# Patient Record
Sex: Female | Born: 1955 | Race: White | Hispanic: No | Smoking: Former smoker
Health system: Southern US, Community
[De-identification: ages and names within clinical notes are randomized; demographics above are authoritative.]

## PROBLEM LIST (undated history)

## (undated) DIAGNOSIS — T8859XA Other complications of anesthesia, initial encounter: Secondary | ICD-10-CM

## (undated) DIAGNOSIS — G5601 Carpal tunnel syndrome, right upper limb: Secondary | ICD-10-CM

## (undated) DIAGNOSIS — G5621 Lesion of ulnar nerve, right upper limb: Secondary | ICD-10-CM

## (undated) DIAGNOSIS — Z923 Personal history of irradiation: Secondary | ICD-10-CM

## (undated) DIAGNOSIS — E785 Hyperlipidemia, unspecified: Secondary | ICD-10-CM

## (undated) DIAGNOSIS — H269 Unspecified cataract: Secondary | ICD-10-CM

## (undated) DIAGNOSIS — K219 Gastro-esophageal reflux disease without esophagitis: Secondary | ICD-10-CM

## (undated) DIAGNOSIS — Z972 Presence of dental prosthetic device (complete) (partial): Secondary | ICD-10-CM

## (undated) DIAGNOSIS — Z853 Personal history of malignant neoplasm of breast: Secondary | ICD-10-CM

## (undated) DIAGNOSIS — I10 Essential (primary) hypertension: Secondary | ICD-10-CM

## (undated) DIAGNOSIS — T4145XA Adverse effect of unspecified anesthetic, initial encounter: Secondary | ICD-10-CM

## (undated) DIAGNOSIS — M1990 Unspecified osteoarthritis, unspecified site: Secondary | ICD-10-CM

## (undated) DIAGNOSIS — E119 Type 2 diabetes mellitus without complications: Secondary | ICD-10-CM

## (undated) HISTORY — DX: Gastro-esophageal reflux disease without esophagitis: K21.9

## (undated) HISTORY — DX: Personal history of irradiation: Z92.3

## (undated) HISTORY — DX: Unspecified cataract: H26.9

## (undated) HISTORY — PX: CATARACT EXTRACTION: SUR2

## (undated) HISTORY — DX: Other complications of anesthesia, initial encounter: T88.59XA

## (undated) HISTORY — DX: Essential (primary) hypertension: I10

## (undated) HISTORY — DX: Adverse effect of unspecified anesthetic, initial encounter: T41.45XA

## (undated) HISTORY — DX: Hyperlipidemia, unspecified: E78.5

---

## 1997-08-30 ENCOUNTER — Other Ambulatory Visit: Admission: RE | Admit: 1997-08-30 | Discharge: 1997-08-30 | Payer: Self-pay | Admitting: Obstetrics and Gynecology

## 1999-02-13 ENCOUNTER — Other Ambulatory Visit: Admission: RE | Admit: 1999-02-13 | Discharge: 1999-02-13 | Payer: Self-pay | Admitting: *Deleted

## 1999-07-24 ENCOUNTER — Other Ambulatory Visit: Admission: RE | Admit: 1999-07-24 | Discharge: 1999-07-24 | Payer: Self-pay | Admitting: Obstetrics and Gynecology

## 1999-07-24 ENCOUNTER — Encounter (INDEPENDENT_AMBULATORY_CARE_PROVIDER_SITE_OTHER): Payer: Self-pay

## 2000-12-17 ENCOUNTER — Other Ambulatory Visit: Admission: RE | Admit: 2000-12-17 | Discharge: 2000-12-17 | Payer: Self-pay | Admitting: Obstetrics and Gynecology

## 2002-01-24 ENCOUNTER — Other Ambulatory Visit: Admission: RE | Admit: 2002-01-24 | Discharge: 2002-01-24 | Payer: Self-pay | Admitting: Obstetrics and Gynecology

## 2002-08-10 ENCOUNTER — Encounter: Payer: Self-pay | Admitting: Specialist

## 2002-08-11 ENCOUNTER — Ambulatory Visit (HOSPITAL_COMMUNITY): Admission: RE | Admit: 2002-08-11 | Discharge: 2002-08-11 | Payer: Self-pay | Admitting: Specialist

## 2002-08-11 HISTORY — PX: KNEE ARTHROSCOPY: SHX127

## 2005-06-29 ENCOUNTER — Emergency Department (HOSPITAL_COMMUNITY): Admission: EM | Admit: 2005-06-29 | Discharge: 2005-06-29 | Payer: Self-pay | Admitting: Emergency Medicine

## 2005-07-10 ENCOUNTER — Ambulatory Visit: Payer: Self-pay | Admitting: Gastroenterology

## 2005-07-16 ENCOUNTER — Ambulatory Visit: Payer: Self-pay | Admitting: Gastroenterology

## 2005-07-28 ENCOUNTER — Ambulatory Visit: Payer: Self-pay | Admitting: Gastroenterology

## 2005-08-04 ENCOUNTER — Ambulatory Visit: Payer: Self-pay | Admitting: Gastroenterology

## 2005-09-16 ENCOUNTER — Ambulatory Visit (HOSPITAL_COMMUNITY): Admission: RE | Admit: 2005-09-16 | Discharge: 2005-09-16 | Payer: Self-pay

## 2005-09-16 ENCOUNTER — Encounter (INDEPENDENT_AMBULATORY_CARE_PROVIDER_SITE_OTHER): Payer: Self-pay | Admitting: *Deleted

## 2005-09-16 HISTORY — PX: CHOLECYSTECTOMY: SHX55

## 2006-09-29 ENCOUNTER — Ambulatory Visit: Payer: Self-pay | Admitting: Vascular Surgery

## 2009-04-28 DIAGNOSIS — C801 Malignant (primary) neoplasm, unspecified: Secondary | ICD-10-CM

## 2009-04-28 DIAGNOSIS — Z853 Personal history of malignant neoplasm of breast: Secondary | ICD-10-CM

## 2009-04-28 HISTORY — DX: Malignant (primary) neoplasm, unspecified: C80.1

## 2009-04-28 HISTORY — DX: Personal history of malignant neoplasm of breast: Z85.3

## 2010-04-28 DIAGNOSIS — Z853 Personal history of malignant neoplasm of breast: Secondary | ICD-10-CM

## 2010-04-28 HISTORY — DX: Personal history of malignant neoplasm of breast: Z85.3

## 2010-05-01 ENCOUNTER — Encounter
Admission: RE | Admit: 2010-05-01 | Discharge: 2010-05-01 | Payer: Self-pay | Source: Home / Self Care | Attending: Radiology | Admitting: Radiology

## 2010-05-20 LAB — COMPREHENSIVE METABOLIC PANEL
ALT: 23 U/L (ref 0–35)
AST: 21 U/L (ref 0–37)
Albumin: 3.8 g/dL (ref 3.5–5.2)
Alkaline Phosphatase: 112 U/L (ref 39–117)
BUN: 21 mg/dL (ref 6–23)
Chloride: 108 mEq/L (ref 96–112)
GFR calc Af Amer: >60 mL/min (ref >60)
Potassium: 4.4 mEq/L (ref 3.5–5.1)
Sodium: 141 mEq/L (ref 135–145)
Total Bilirubin: 0.5 mg/dL (ref 0.3–1.2)
Total Protein: 6.8 g/dL (ref 6.0–8.3)

## 2010-05-20 LAB — DIFFERENTIAL
Basophils Relative: 1 % (ref 0–1)
Eosinophils Absolute: 0.3 10*3/uL (ref 0.0–0.7)
Lymphs Abs: 3.6 10*3/uL (ref 0.7–4.0)
Monocytes Relative: 7 % (ref 3–12)
Neutro Abs: 5.4 10*3/uL (ref 1.7–7.7)
Neutrophils Relative %: 53 % (ref 43–77)

## 2010-05-20 LAB — CBC
Hemoglobin: 13.5 g/dL (ref 12.0–15.0)
MCH: 30.5 pg (ref 26.0–34.0)
Platelets: 212 10*3/uL (ref 150–400)
RBC: 4.42 MIL/uL (ref 3.87–5.11)
WBC: 10.1 10*3/uL (ref 4.0–10.5)

## 2010-05-20 LAB — URINALYSIS, ROUTINE W REFLEX MICROSCOPIC
Nitrite: NEGATIVE
Specific Gravity, Urine: 1.018 (ref 1.005–1.030)
Urine Glucose, Fasting: NEGATIVE mg/dL
pH: 5.5 (ref 5.0–8.0)

## 2010-05-21 ENCOUNTER — Ambulatory Visit (HOSPITAL_COMMUNITY)
Admission: RE | Admit: 2010-05-21 | Discharge: 2010-05-21 | Payer: Self-pay | Source: Home / Self Care | Attending: General Surgery | Admitting: General Surgery

## 2010-05-21 HISTORY — PX: PARTIAL MASTECTOMY WITH NEEDLE LOCALIZATION AND AXILLARY SENTINEL LYMPH NODE BX: SHX6009

## 2010-05-22 LAB — GLUCOSE, CAPILLARY: Glucose-Capillary: 116 mg/dL — ABNORMAL HIGH (ref 70–99)

## 2010-05-23 ENCOUNTER — Ambulatory Visit: Payer: Self-pay | Admitting: Oncology

## 2010-05-24 NOTE — Op Note (Signed)
NAMESHAMYA, [name]                ACCOUNT NO.:  [id number]  MEDICAL RECORD NO.:  [id number]          PATIENT TYPE:  AMB  LOCATION:  SDS                          FACILITY:  [facility]  PHYSICIAN:  Angelia Mould. 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  OPERATIONS PERFORMED: 1. Inject blue dye, left breast. 2. Left partial mastectomy with needle localization. 3. Left axillary sentinel lymph node mapping and biopsy. 4. Re-excision medial margin of lumpectomy cavity.  SURGEON:  Angelia Mould. Derrell Lolling, MD  OPERATIVE INDICATIONS:  This is a 55 year old Caucasian female without any history of prior breast problems.  Screening mammograms showed a small 7-mm mass superiorly in the left breast at the 12 o'clock position.  This was biopsied and showed invasive ductal carcinoma which was strongly ER/PR positive and HER2 negative with a Ki-67 of 17%. Subsequent MRI measured the post biopsy mass as a 1.1 x 1.3 cm mass and indicated this was a solitary abnormality without any other abnormalities in either breast and no adenopathy.  She was counseled as an outpatient.  She desired breast conservation.  She is brought to the operating room electively.  OPERATIVE TECHNIQUE:  The patient underwent wire localization at [facility] this morning and the wire entered the left breast from the lateral-to-medial direction and went through and just superior to the mammographic abnormality.  The radiologist marked the skin with an X overlying the tumor.  The patient was then brought to [facility].  In the holding area, the Nuclear Medicine technician injected her  with radionuclide.  The mammograms were reviewed.  The patient was taken to the operating room.  General endotracheal anesthesia was induced.  The patient was identified as correct patient, correct procedure, and correct site, and a surgical time-out was held. Following an alcohol prep, I injected 5 mL of blue dye into the left breast subareolar area.  This was 2 mL of methylene blue mixed with 3 mL of saline.  The breast was massaged for 5 minutes.  We then prepped and draped the left breast and axilla and chest wall. Intravenous antibiotics had been given 0.5% Marcaine with epinephrine was used as a local infiltration anesthetic.  I mapped out a transverse curvilinear incision in the superior aspect of the left breast, placing the radiologist'a skin mark at the center.  The incision was made and dissection was carried down into the breast tissue following the wire and the skin marker. Immediately, I felt that I came close to the tumor as I just saw the tip of the wire.  I removed the lumpectomy specimen and marked it with the 6- color margin marker kit.  Specimen mammogram showed that we had removed the mammographic abnormality and the marker clip.  This was then sent to pathology.  Because of my concern that I might have been close  on the medial margin, I then re-excised about a 5 x 5 x 1 cm area of breast tissue at the medial margin starting just below the skin edge and going almost all the way back to the pectoralis fascia.  The specimen was then removed and marked with the color kit to mark the new medial margin.  This was sent for routine histology.  This wound was irrigated with saline. Hemostasis was excellent and achieved with electrocautery.  The breast tissues were closed with interrupted sutures of 3-0 Vicryl and I closed the breast tissues in 2 separate layers and I then closed the skin with a running subcuticular suture of 4-0 Monocryl and Dermabond.  I then used the  NeoProbe to identify the focal increased radioactivity in the left axilla.  A transverse incision was made at the hairline and the skin crease.  Dissection was carried down through the subcutaneous tissue.  I then incised the clavipectoral fascia.  Using the NeoProbe, I mapped out 2 sentinel lymph nodes.  One was very hot and very blue and one was very hot but was not blue.  These were sent as sentinel lymph nodes #1 and #2.  There was some additional axillary tissue that came out as part of the dissection.  This was sent labeled additional nonsentinel axillary tissue.  The left axilla was irrigated with saline. Hemostasis was excellent and achieved with electrocautery.  The deeper axillary tissues were closed with interrupted sutures of 3-0 Vicryl and skin closed with running subcuticular suture of 4-0 Monocryl and Dermabond.  The patient tolerated the procedure well and was taken to the recovery room in stable condition.  Estimated blood loss was about 20 mL.  Complications none.  Sponge, needle, and instrument counts were correct.     Angelia Mould. Derrell Lolling, M.D.     HMI/MEDQ  D:  05/21/2010  T:  05/22/2010  Job:  [id number]  cc:   Windle Guard, M.D. Barb Merino, M.D.  Electronically Signed by Claud Kelp M.D. on 05/22/2010 10:04:52 AM

## 2010-06-03 ENCOUNTER — Encounter (HOSPITAL_BASED_OUTPATIENT_CLINIC_OR_DEPARTMENT_OTHER): Payer: 59 | Admitting: Oncology

## 2010-06-03 ENCOUNTER — Encounter: Payer: Self-pay | Admitting: Gastroenterology

## 2010-06-03 ENCOUNTER — Ambulatory Visit: Payer: 59 | Attending: Radiation Oncology | Admitting: Radiation Oncology

## 2010-06-03 DIAGNOSIS — L2989 Other pruritus: Secondary | ICD-10-CM | POA: Insufficient documentation

## 2010-06-03 DIAGNOSIS — R5381 Other malaise: Secondary | ICD-10-CM | POA: Insufficient documentation

## 2010-06-03 DIAGNOSIS — Z510 Encounter for antineoplastic radiation therapy: Secondary | ICD-10-CM | POA: Insufficient documentation

## 2010-06-03 DIAGNOSIS — C50919 Malignant neoplasm of unspecified site of unspecified female breast: Secondary | ICD-10-CM

## 2010-06-03 DIAGNOSIS — L538 Other specified erythematous conditions: Secondary | ICD-10-CM | POA: Insufficient documentation

## 2010-06-03 DIAGNOSIS — L298 Other pruritus: Secondary | ICD-10-CM | POA: Insufficient documentation

## 2010-06-03 DIAGNOSIS — C50419 Malignant neoplasm of upper-outer quadrant of unspecified female breast: Secondary | ICD-10-CM | POA: Insufficient documentation

## 2010-07-09 NOTE — Letter (Signed)
Summary: Ruthann Cancer MD  Ruthann Cancer MD   Imported By: Lester [place] 07/04/2010 09:09:13  _____________________________________________________________________  External Attachment:    Type:   Image     Comment:   External Document

## 2010-08-20 ENCOUNTER — Other Ambulatory Visit: Payer: Self-pay | Admitting: Oncology

## 2010-08-20 ENCOUNTER — Encounter: Payer: 59 | Admitting: Oncology

## 2010-08-20 LAB — RESEARCH LABS

## 2010-09-09 ENCOUNTER — Encounter (HOSPITAL_COMMUNITY): Payer: 59

## 2010-09-09 ENCOUNTER — Other Ambulatory Visit: Payer: Self-pay | Admitting: Obstetrics & Gynecology

## 2010-09-09 LAB — SURGICAL PCR SCREEN
MRSA, PCR: NEGATIVE
Staphylococcus aureus: NEGATIVE

## 2010-09-09 LAB — BASIC METABOLIC PANEL
CO2: 25 mEq/L (ref 19–32)
Calcium: 9.8 mg/dL (ref 8.4–10.5)
Chloride: 101 mEq/L (ref 96–112)
GFR calc Af Amer: >60 mL/min (ref >60)
Sodium: 136 mEq/L (ref 135–145)

## 2010-09-09 LAB — CBC
Hemoglobin: 13.7 g/dL (ref 12.0–15.0)
Platelets: 214 10*3/uL (ref 150–400)
RBC: 4.36 MIL/uL (ref 3.87–5.11)

## 2010-09-10 NOTE — Consult Note (Signed)
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  She works at [employer] and is on her feet during  the day and has what she describes as what she describes as throbbing  and aching fatigue in both legs as the day progresses.  She has not worn  elastic compression stockings but does state that elevation of the legs  is helpful.  She has swelling in her ankles and feet as the day  progresses.  She has had no history of bleeding, ulcerations,  thrombophlebitis, or deep venous thrombosis.  She has tried Aleve with  some success; but, this was discontinued at Dr. Hennie Duos recommendation.   PAST MEDICAL HISTORY:  Positive for:  1. Borderline diabetes mellitus.  2. Negative for hypertension, hyperlipidemia, coronary artery disease,      chronic obstructive pulmonary disease, or stroke.   PAST SURGICAL HISTORY:  Previous surgery includes:  1. Left knee replacement.  2. Cholecystectomy.   FAMILY HISTORY:  Positive for coronary artery disease and diabetes in  her mother and father.  Negative for stroke.   SOCIAL HISTORY:  She is married and does work as a Pensions consultant at the  [employer]; and, as noted, is on her feet most of the day.  She does not use tobacco or alcohol.   REVIEW OF SYSTEMS:  Please see Health History Form.   MEDICATIONS:  Please see Health History Form.   ALLERGIES:  None known.   PHYSICAL EXAMINATION:  VITAL SIGNS:  Blood pressure is 145/86, heart  rate is 87, respirations 18.  GENERAL:  In general, she is a healthy-appearing middle-aged female in  no apparent distress.  She is alert and oriented x3.  NECK:  Her neck is supple.  There are 3+ carotid pulses palpable.  No  bruits are audible.   There is no palpable adenopathy in the neck.  NEUROLOGIC EXAM:  Normal.  EXTREMITIES:  Upper extremity pulses are 3+ bilaterally at the brachial  and radial levels.  CHEST:  Clear to auscultation.  CARDIOVASCULAR EXAM:  There is a regular rhythm.  No murmurs.  ABDOMEN:  Soft, nontender with no palpable masses.  PULSES:  She has 3+ femoral popliteal and 2+ dorsalis pedis pulses  palpable bilaterally.   ASSESSMENT:  She has diffuse spider veins and reticular veins in both  lower extremities particularly in the distal anterior thigh laterally  and medially, as well as into the calf regions laterally.  There is a  prominent varix present over her right knee lateral to the patella  extending over about a 6 inch distance.  She has no hyperpigmentation  ulceration.  There is evidence of spaces ulcers in the past or evidence  of chronic venous insufficiency distally.   I do not think she has evidence of severe underlying valvular  incompetence or reflux but does have significant reticular and spider  veins bilaterally and 1 prominent varix on the left leg all of which  could be treated well with sclerotherapy.   RECOMMENDATIONS/PLAN:  I discussed this with her and recommended it and  also we fit her for some elastic compression stockings which I think  will improve her symptomatology.  She will decide whether she would like  to proceed with the sclerotherapy and notify us.   Quita Skye Hart [place], M.D.  Electronically Signed  JDL/MEDQ  D:  09/29/2006  T:  09/29/2006  Job:  [id number]   cc:   Windle Guard, M.D.

## 2010-09-13 ENCOUNTER — Other Ambulatory Visit: Payer: Self-pay | Admitting: Oncology

## 2010-09-13 ENCOUNTER — Encounter (HOSPITAL_BASED_OUTPATIENT_CLINIC_OR_DEPARTMENT_OTHER): Payer: 59 | Admitting: Oncology

## 2010-09-13 DIAGNOSIS — Z170 Estrogen receptor positive status [ER+]: Secondary | ICD-10-CM

## 2010-09-13 DIAGNOSIS — C50419 Malignant neoplasm of upper-outer quadrant of unspecified female breast: Secondary | ICD-10-CM

## 2010-09-13 DIAGNOSIS — F411 Generalized anxiety disorder: Secondary | ICD-10-CM

## 2010-09-13 DIAGNOSIS — C50919 Malignant neoplasm of unspecified site of unspecified female breast: Secondary | ICD-10-CM

## 2010-09-13 LAB — CBC WITH DIFFERENTIAL/PLATELET
BASO%: 0.6 % (ref 0.0–2.0)
EOS%: 3 % (ref 0.0–7.0)
HCT: 39.5 % (ref 34.8–46.6)
HGB: 13.1 g/dL (ref 11.6–15.9)
MCHC: 33.2 g/dL (ref 31.5–36.0)
MONO#: 0.5 10*3/uL (ref 0.1–0.9)
NEUT%: 70.8 % (ref 38.4–76.8)
RDW: 13.8 % (ref 11.2–14.5)
WBC: 8.2 10*3/uL (ref 3.9–10.3)
lymph#: 1.7 10*3/uL (ref 0.9–3.3)

## 2010-09-13 LAB — COMPREHENSIVE METABOLIC PANEL
ALT: 25 U/L (ref 0–35)
AST: 21 U/L (ref 0–37)
Albumin: 4.2 g/dL (ref 3.5–5.2)
CO2: 24 mEq/L (ref 19–32)
Calcium: 9.6 mg/dL (ref 8.4–10.5)
Chloride: 104 mEq/L (ref 96–112)
Creatinine, Ser: 1.04 mg/dL (ref 0.40–1.20)
Potassium: 4 mEq/L (ref 3.5–5.3)
Total Protein: 6.7 g/dL (ref 6.0–8.3)

## 2010-09-13 NOTE — Op Note (Signed)
NAMEAHLAYA, [name]                            ACCOUNT NO.:  [id number]   MEDICAL RECORD NO.:  [id number]                   PATIENT TYPE:  AMB   LOCATION:  DAY                                  FACILITY:  [facility]   PHYSICIAN:  Ronnell Guadalajara, M.D.                DATE OF BIRTH:  12/25/1955   DATE OF PROCEDURE:  08/11/2002  DATE OF DISCHARGE:                                 OPERATIVE REPORT   PREOPERATIVE DIAGNOSES:  Degenerative arthritis left knee with suspected  medial meniscal tear.   POSTOPERATIVE DIAGNOSES:  Degenerative arthritis medial aspect left knee  with enlarged plica.   OPERATION:  1. Diagnostic laparoscopy.  2. Plica resection.   DESCRIPTION OF PROCEDURE:  After suitable general anesthesia, the leg was  exsanguinated with an Esmarch and an Ace bandage combination and an upper  thigh tourniquet is inflated to 400 mmHg. The leg is then placed in an  arthroscopy leg holder and prepped and draped routinely. Arthroscopy is  carried out with a lateral parapatellar inflow portal and an anterolateral  portal for the correction of the scope and an anteromedial portal made under  direct visualization. Up in the suprapatellar pouch, the patellofemoral  joint looks quite good with a great deal of arthritis. There is a fairly  large medial plica that shows some abutment and wear across the surface of  the medial femoral condyle that may have accounted for some of the catching  that he has felt going on in the knee. Into the joint, there is significant  arthritic change over the medial femoral condyle although not down to bare  bone. There is significant wear of the major component of her problem  occupying a good third of the articular surface. The medial meniscus is  inspected and probed and there is no significant tearing or looseness of the  meniscus. The ACL is inspected and probed and feels good. The lateral joint  is quite sustained with normal articular cartilage and  menisci. At this  point, the 4.5 rotary meniscotome was brought in and the medial plica was  removed hoping that this was contributing a part of the discomfort in the  knee rather than blaming it all on the medial femoral condylar arthritis.  The end of the procedure, the wounds were sutured with nylon, 20 mL plain  Marcaine and 8 mL of 2% Xylocaine with adrenaline were put into the joint  and she had a mL of Xylocaine with adrenaline put in each of the portals. A  nice compression dressing. She goes to recovery in good condition.   IMPRESSION:  Degenerative arthritis medial side but significant medial  femoral condylar wear, minimal on the tibia.  Ronnell Guadalajara, M.D.    PC/MEDQ  D:  08/11/2002  T:  08/11/2002  Job:  [id number]

## 2010-09-13 NOTE — Op Note (Signed)
NAMEMAXINE, FREDMAN                ACCOUNT NO.:  [id number]   MEDICAL RECORD NO.:  [id number]          PATIENT TYPE:  AMB   LOCATION:  DAY                          FACILITY:  [facility]   PHYSICIAN:  Lebron Conners, M.D.   DATE OF BIRTH:  06-Jan-1956   DATE OF PROCEDURE:  09/16/2005  DATE OF DISCHARGE:                                 OPERATIVE REPORT   PRE AND POSTOPERATIVE DIAGNOSIS:  Cholelithiasis and chronic cholecystitis.   OPERATION:  Laparoscopic cholecystectomy.   SURGEON:  Lebron Conners, M.D.   ASSISTANT:  Angelia Mould. Derrell Lolling, M.D.   ANESTHESIA:  General and local.   SPECIMEN:  Gallbladder.   BLOOD LOSS:  Minimal.   COMPLICATIONS:  None.  The patient PACU in good condition.   PROCEDURE:  After the patient was monitored and anesthetized and had routine  preparation and draping of the abdomen, I infiltrated local anesthetic below  the umbilicus and made a transverse 3 cm incision and incised the fascia 2  cm in the midline then bluntly entered the peritoneal cavity.  There were no  evident adhesions when I put in my finger.  I put in a 0 Vicryl pursestring  suture and secured a Hassan cannula and inflated the abdomen with carbon  dioxide.  Laparoscopy disclosed no abnormalities except for a thick-walled  chronically inflamed gallbladder with some stringy adhesions to the omentum  and anterior abdominal wall around it.  I then anesthetized three additional  port sites and placed under direct view a 10 mm epigastric port and two 5 mm  right mid abdominal ports.  Grasping the gallbladder at the fundus and  elevated it toward the right shoulder and positioned the patient head-up  foot down and tilted to the left.  The adhesions were easily cleared and I  then noted that the gallbladder was full of large gallstones but was able to  grasp the infundibulum and pull it laterally.  I dissected the  hepatoduodenal ligament creating sizable window between the liver and  gallbladder  and cystic duct.  I noted the emergence of the cystic duct from  the infundibulum of the gallbladder and I noted the cystic artery traversing  the triangle of Calot.  I clipped the cystic artery with three clips and cut  between the two which were closest to the gallbladder and then cut the  cystic duct after clipping it with four clips and cutting between the two  closest to the gallbladder.  Further dissection, I encountered one  additional small artery and I clipped and ligated that.  I detached the  gallbladder from the liver using cautery and gained hemostasis with cautery.  There was some leakage of bile from the gallbladder at sites of the  graspers.  After detaching the gallbladder.  I placed it in a plastic pouch  and reserved it.  I then irrigated the right upper quadrant and removed the  irrigant and assured good hemostasis.  The clips seemed to be secure.  I  removed the gallbladder through the umbilical incision and tied the  pursestring suture.  Because I had  to enlarge the incision slightly, placed  one other 2-0 Vicryl stitch.  I removed the remaining irrigant from the  right upper quadrant and then  removed the two lateral ports under direct view and saw that there was no  bleeding from the abdominal wall.  I removed the epigastric port after  allowing the carbon dioxide to escape.  I closed all skin incisions with  intracuticular 4-0 Vicryl and Steri-Strips.      Lebron Conners, M.D.  Electronically Signed     WB/MEDQ  D:  09/16/2005  T:  09/16/2005  Job:  [id number]   cc:   Vania Rea. Jarold Motto, M.D. [facility]  [address]  [place]  [place] [address]   Windle Guard, M.D.  Fax: [phone]

## 2010-09-17 ENCOUNTER — Other Ambulatory Visit: Payer: Self-pay | Admitting: Obstetrics & Gynecology

## 2010-09-17 ENCOUNTER — Ambulatory Visit (HOSPITAL_COMMUNITY)
Admission: RE | Admit: 2010-09-17 | Discharge: 2010-09-18 | Disposition: A | Discharge status: Home / Self Care | Payer: 59 | Source: Ambulatory Visit | Attending: Obstetrics & Gynecology | Admitting: Obstetrics & Gynecology

## 2010-09-17 DIAGNOSIS — C50919 Malignant neoplasm of unspecified site of unspecified female breast: Secondary | ICD-10-CM | POA: Insufficient documentation

## 2010-09-17 DIAGNOSIS — Z170 Estrogen receptor positive status [ER+]: Secondary | ICD-10-CM | POA: Insufficient documentation

## 2010-09-17 DIAGNOSIS — N840 Polyp of corpus uteri: Secondary | ICD-10-CM | POA: Insufficient documentation

## 2010-09-17 DIAGNOSIS — D251 Intramural leiomyoma of uterus: Secondary | ICD-10-CM | POA: Insufficient documentation

## 2010-09-17 DIAGNOSIS — N920 Excessive and frequent menstruation with regular cycle: Secondary | ICD-10-CM | POA: Insufficient documentation

## 2010-09-17 DIAGNOSIS — Z01812 Encounter for preprocedural laboratory examination: Secondary | ICD-10-CM | POA: Insufficient documentation

## 2010-09-17 DIAGNOSIS — Z01818 Encounter for other preprocedural examination: Secondary | ICD-10-CM | POA: Insufficient documentation

## 2010-09-17 DIAGNOSIS — D252 Subserosal leiomyoma of uterus: Secondary | ICD-10-CM | POA: Insufficient documentation

## 2010-09-17 DIAGNOSIS — N80209 Endometriosis of unspecified fallopian tube, unspecified depth: Secondary | ICD-10-CM | POA: Insufficient documentation

## 2010-09-17 DIAGNOSIS — N802 Endometriosis of fallopian tube: Secondary | ICD-10-CM | POA: Insufficient documentation

## 2010-09-17 HISTORY — PX: ROBOTIC ASSISTED LAPAROSCOPIC LYSIS OF ADHESION: SHX6080

## 2010-09-17 HISTORY — PX: ROBOTIC ASSISTED TOTAL HYSTERECTOMY WITH BILATERAL SALPINGO OOPHERECTOMY: SHX6086

## 2010-09-17 LAB — PREGNANCY, URINE: Preg Test, Ur: NEGATIVE

## 2010-09-17 LAB — TYPE AND SCREEN

## 2010-09-18 LAB — CBC
HCT: 38.4 % (ref 36.0–46.0)
Hemoglobin: 12.2 g/dL (ref 12.0–15.0)
MCH: 31 pg (ref 26.0–34.0)
MCHC: 31.8 g/dL (ref 30.0–36.0)
MCV: 97.5 fL (ref 78.0–100.0)
RBC: 3.94 MIL/uL (ref 3.87–5.11)

## 2010-09-25 NOTE — Op Note (Signed)
[name], [name]                ACCOUNT NO.:  [id number]  MEDICAL RECORD NO.:  [id number]           PATIENT TYPE:  O  LOCATION:  9311                          FACILITY:  [facility]  PHYSICIAN:  Genia Del, M.D.DATE OF BIRTH:  05/30/1955  DATE OF PROCEDURE:  09/17/2010 DATE OF DISCHARGE:                              OPERATIVE REPORT   The patient came [facility] on Sep 17, 2010.  PREOPERATIVE DIAGNOSES:  Uterine myomas, endometrial polyps associated with menometrorrhagia, and history of breast cancer with estrogen receptor positive.  POSTOPERATIVE DIAGNOSES:  Uterine myomas, endometrial polyps associated with menometrorrhagia, and history of breast cancer with estrogen receptor positive, plus bowel adhesions in the abdomen and pelvis.  PROCEDURE:  A total laparoscopy hysterectomy and bilateral salpingo- oophorectomy with lysis of adhesions assisted with Engineer, building services robot.  SURGEON:  Genia Del, MD.  ASSISTANT:  Arlan Organ, MD.  DESCRIPTION OF PROCEDURE:  Under general anesthesia with endotracheal intubation, the patient is in lithotomy position.  She is prepped with Betadine on the abdominal, suprapubic, vulvar, and vaginal areas and draped as usual.  The vaginal exam reveals a nodular increased in size uterus about 9-10 cm.  No adnexal mass felt.  We inserted the weighted speculum in the vagina.  The anterior lip of the cervix was grasped with a tenaculum.  The hysterometery used at 8 cm.  We dilated the cervix with Hegar dilators.  We used a #8 RUMI with a medium KOH  righ, this is put in place easily.  We then put the Foley in place in the bladder.  We then go to the abdomen.  We infiltrated the subcutaneous tissue with Marcaine one-quarter plain at the Palmar point.  We make a 5-mm incision with a scalpel, inserted the Veress needle at that level.  Security test was done.  We created pneumoperitoneum with CO2.  We then insert a 5-mm trocar at  that level and a 5-mm camera.  Inspection of the abdominopelvic cavities reveal omental adhesions at the umbilicus.  The patient is status post cholecystectomy by laparoscopy.  We are clear for all the ports, we will put the umbilical incision at the superior aspect just above the the adhesions.  We infiltrated the skin at all levels with Marcaine one-quarter plain.  We make 8-mm incisions for robotic ports and a 10-mm incision for the camera port.  We used an M configuration.  We insert all ports under direct vision.  We then dock the robot on the right.  We put the instruments in, the EndoShear scissors, the PK, and Cobra clamp.  We go to the console.  We visualized the intra-abdominal and pelvic cavities.  We note adhesions that are widespread between the omentum, bowels, and the pelvic cavity as well as the abdominal walls.  The adhesions with the omentum at the umbilicus were easily removed.  We then removed adhesions between the bowels and the bladder.  We continued and removed adhesions at the back of the uterus and towards the left side and we released the bowels from the left lateral abdominal side.  We were then  able to see both adnexa and start the hysterectomy.  The ureters were difficult to see, but were in normal anatomic position bilaterally.  We cauterized and sectioned the left round ligament.  We then cauterized and sectioned the left infundibulopelvic ligament and continued under the left ovary to reach the lateral wall of the uterus.  We proceeded exactly the same way on the right side.  We then opened the visceral peritoneum anteriorly and descended the bladder past the KOH ring.  We finally cauterized and sectioned the left uterine artery and then the right uterine artery.  We were then able to open the vaginal vault anteriorly with the tip of the EndoShear scissors.  We went towards the sites, then opened the back and finished on either side from the back.  The  uterus, ovaries, tubes, and cervix were removed in one specimen through the vagina and sent to pathology.  We then put the occluder in the vagina.  We verified hemostasis and it was adequate.  We therefore switch instruments.  We used the cutting needle driver in the right hand.  We started with a long tip clamp in the left hand, but switched to the regular needle driver because it was more efficient.  We used the PK in the third arm. All the sutures total of five were brought in through the supraumbilical camera port and the camera was then put back in place.  Those sutures were parked in the right abdominal wall as we were using them.  We closed the left angle of the vaginal vault first, then the middle and then the right angle and we added two stitches on either side of the middle.  The vaginal vault was well closed.  We verified hemostasis by lowering the pressure and it was adequate.  We therefore removed all robotic instruments and went by laparoscopy.  We used a 5-mm camera as all the five sutures were removed from the abdomen through the supraumbilical port.  We then irrigated and suctioned the abdominopelvic cavities.  Hemostasis was adequate at all levels.  We removed all instruments, removed all ports under direct vision.  We closed the supraumbilical incision with a Vicryl zero on the aponeurosis.  We closed all incisions with a subcuticular stitch of Vicryl 4-0 and added Dermabond on the skin.  The patient received Ancef 1 gram IV before induction.  The estimated blood loss was 100 mL.  No complications occurred and she was brought to recovery room in good stable status.     Genia Del, M.D.     ML/MEDQ  D:  09/17/2010  T:  09/18/2010  Job:  [id number]  Electronically Signed by Genia Del M.D. on 09/25/2010 05:04:35 PM

## 2010-09-30 ENCOUNTER — Ambulatory Visit
Admission: RE | Admit: 2010-09-30 | Discharge: 2010-09-30 | Disposition: A | Discharge status: Home / Self Care | Payer: 59 | Source: Ambulatory Visit | Attending: Radiation Oncology | Admitting: Radiation Oncology

## 2010-12-05 ENCOUNTER — Other Ambulatory Visit: Payer: Self-pay | Admitting: Oncology

## 2010-12-05 ENCOUNTER — Encounter (HOSPITAL_BASED_OUTPATIENT_CLINIC_OR_DEPARTMENT_OTHER): Payer: 59 | Admitting: Oncology

## 2010-12-05 DIAGNOSIS — C50419 Malignant neoplasm of upper-outer quadrant of unspecified female breast: Secondary | ICD-10-CM

## 2010-12-05 DIAGNOSIS — C50919 Malignant neoplasm of unspecified site of unspecified female breast: Secondary | ICD-10-CM

## 2010-12-05 LAB — CBC WITH DIFFERENTIAL/PLATELET
BASO%: 0.8 % (ref 0.0–2.0)
HCT: 38.5 % (ref 34.8–46.6)
LYMPH%: 24.4 % (ref 14.0–49.7)
MCHC: 31.9 g/dL (ref 31.5–36.0)
MCV: 97 fL (ref 79.5–101.0)
MONO#: 0.5 10*3/uL (ref 0.1–0.9)
MONO%: 6.8 % (ref 0.0–14.0)
NEUT%: 62.9 % (ref 38.4–76.8)
Platelets: 208 10*3/uL (ref 145–400)
RBC: 3.97 10*6/uL (ref 3.70–5.45)
WBC: 7.8 10*3/uL (ref 3.9–10.3)

## 2010-12-05 LAB — COMPREHENSIVE METABOLIC PANEL
ALT: 29 U/L (ref 0–35)
Alkaline Phosphatase: 99 U/L (ref 39–117)
CO2: 24 mEq/L (ref 19–32)
Creatinine, Ser: 1.06 mg/dL (ref 0.50–1.10)
Glucose, Bld: 116 mg/dL — ABNORMAL HIGH (ref 70–99)
Sodium: 141 mEq/L (ref 135–145)
Total Bilirubin: 0.6 mg/dL (ref 0.3–1.2)

## 2010-12-05 LAB — CANCER ANTIGEN 27.29: CA 27.29: 29 U/mL (ref 0–39)

## 2010-12-05 LAB — VITAMIN D 25 HYDROXY (VIT D DEFICIENCY, FRACTURES): Vit D, 25-Hydroxy: 45 ng/mL (ref 30–89)

## 2010-12-06 ENCOUNTER — Encounter (INDEPENDENT_AMBULATORY_CARE_PROVIDER_SITE_OTHER): Payer: Self-pay | Admitting: General Surgery

## 2010-12-12 ENCOUNTER — Encounter (HOSPITAL_BASED_OUTPATIENT_CLINIC_OR_DEPARTMENT_OTHER): Payer: 59 | Admitting: Oncology

## 2010-12-12 DIAGNOSIS — Z170 Estrogen receptor positive status [ER+]: Secondary | ICD-10-CM

## 2010-12-12 DIAGNOSIS — C50919 Malignant neoplasm of unspecified site of unspecified female breast: Secondary | ICD-10-CM

## 2010-12-12 DIAGNOSIS — E1149 Type 2 diabetes mellitus with other diabetic neurological complication: Secondary | ICD-10-CM

## 2010-12-12 DIAGNOSIS — E1142 Type 2 diabetes mellitus with diabetic polyneuropathy: Secondary | ICD-10-CM

## 2010-12-16 ENCOUNTER — Ambulatory Visit (INDEPENDENT_AMBULATORY_CARE_PROVIDER_SITE_OTHER): Payer: 59 | Admitting: General Surgery

## 2011-01-20 ENCOUNTER — Encounter (INDEPENDENT_AMBULATORY_CARE_PROVIDER_SITE_OTHER): Payer: Self-pay | Admitting: General Surgery

## 2011-01-20 ENCOUNTER — Ambulatory Visit (INDEPENDENT_AMBULATORY_CARE_PROVIDER_SITE_OTHER): Payer: 59 | Admitting: General Surgery

## 2011-01-20 VITALS — BP 132/72 | HR 76 | Temp 97.3°F | Resp 16 | Ht 62.0 in | Wt 234.2 lb

## 2011-01-20 DIAGNOSIS — Z853 Personal history of malignant neoplasm of breast: Secondary | ICD-10-CM

## 2011-01-20 NOTE — Patient Instructions (Signed)
Your breast exam today is normal. I do not feel any evidence of cancer. I will see you in January after you get your annual mammograms.

## 2011-01-20 NOTE — Progress Notes (Signed)
Subjective:     Patient ID: [name], female   DOB: [birth year], 55 y.o.   MRN: [id number]  HPI This 55 year old Caucasian female underwent left partial mastectomy sentinel lymph biopsy and reexcision of medial margins on May 21, 2010. Pathologically she has T1c., N0, receptor positive, HER-2-negative, Ki-67 17% tumor. She underwent adjuvant radiation therapy under the guidance of Dr. Roselind Messier and is on letrozole and is followed by Dr. Darnelle Catalan. Windle Guard is her primary care physician.  She has no complaints about her breast. She notices a little bit of thickening under the lumpectomy scar and  a little bit of tenderness. She notices that her bones and joints hurt a little bit more since she started the antiestrogen therapy however she is aware that as a normal side effect.  Past Medical History  Diagnosis Date  . Hypertension   . Hyperlipidemia   . GERD (gastroesophageal reflux disease)     Current Outpatient Prescriptions  Medication Sig Dispense Refill  . atenolol (TENORMIN) 100 MG tablet Take 100 mg by mouth daily.        . calcium carbonate (OS-CAL) 600 MG TABS Take 600 mg by mouth 2 (two) times daily with a meal.        . fish oil-omega-3 fatty acids 1000 MG capsule Take 1 g by mouth daily.        Marland Kitchen gabapentin (NEURONTIN) 300 MG capsule       . letrozole (FEMARA) 2.5 MG tablet       . niacin 500 MG tablet Take 500 mg by mouth daily with breakfast.        . pravastatin (PRAVACHOL) 40 MG tablet Take 40 mg by mouth daily.        . ranitidine (ZANTAC) 300 MG tablet Take 300 mg by mouth at bedtime.        . meloxicam (MOBIC) 15 MG tablet Take 15 mg by mouth daily.         No Known Allergies   Review of Systems  12 system review of systems is performed and is negative except as described above.    Objective:   Physical Exam  Constitutional: She appears well-developed and well-nourished. No distress.  Eyes: Conjunctivae are normal. Left eye exhibits no discharge. No  scleral icterus.  Neck: Normal range of motion. Neck supple. No JVD present. No tracheal deviation present. No thyromegaly present.  Cardiovascular: Normal rate, regular rhythm, normal heart sounds and intact distal pulses.   No murmur heard. Pulmonary/Chest: Effort normal and breath sounds normal. No respiratory distress. She has no wheezes. She has no rales. She exhibits no tenderness.    Lymphadenopathy:    She has no cervical adenopathy.  Skin: Skin is warm and dry. No rash noted. She is not diaphoretic. No erythema. No pallor.  Psychiatric: She has a normal mood and affect. Her behavior is normal. Judgment and thought content normal.       Assessment:     Invasive ductal carcinoma left breast, pathologic stage TIc., N0, receptor positive, HER-2-negative, Ki-67 17%.  No evidence of recurrence 9 months following left partial mastectomy and sentinel node biopsy.  Hypertension.  Gastroesophageal reflux disease.    Plan:     Continue letrozole.  Bilateral mammograms due in December 2012.  See me in January 2013.

## 2011-02-18 ENCOUNTER — Ambulatory Visit
Admission: RE | Admit: 2011-02-18 | Discharge: 2011-02-18 | Disposition: A | Discharge status: Home / Self Care | Payer: 59 | Source: Ambulatory Visit | Attending: Radiation Oncology | Admitting: Radiation Oncology

## 2011-02-20 ENCOUNTER — Encounter: Payer: Self-pay | Admitting: *Deleted

## 2011-02-20 DIAGNOSIS — C50919 Malignant neoplasm of unspecified site of unspecified female breast: Secondary | ICD-10-CM

## 2011-02-21 ENCOUNTER — Telehealth: Payer: Self-pay | Admitting: Oncology

## 2011-02-21 NOTE — Telephone Encounter (Signed)
Lvm advising appts for lab 06/03/11 @ 8.30am and md visit 06/10/11 @ 9.30am. I will also mail pt an appt calendar.

## 2011-05-15 ENCOUNTER — Encounter: Payer: Self-pay | Admitting: Oncology

## 2011-06-02 ENCOUNTER — Other Ambulatory Visit (HOSPITAL_BASED_OUTPATIENT_CLINIC_OR_DEPARTMENT_OTHER): Payer: 59 | Admitting: Lab

## 2011-06-02 DIAGNOSIS — C50919 Malignant neoplasm of unspecified site of unspecified female breast: Secondary | ICD-10-CM

## 2011-06-02 DIAGNOSIS — F411 Generalized anxiety disorder: Secondary | ICD-10-CM

## 2011-06-02 DIAGNOSIS — Z170 Estrogen receptor positive status [ER+]: Secondary | ICD-10-CM

## 2011-06-02 LAB — CBC WITH DIFFERENTIAL/PLATELET
BASO%: 0.4 % (ref 0.0–2.0)
LYMPH%: 33.7 % (ref 14.0–49.7)
MCHC: 34.2 g/dL (ref 31.5–36.0)
MONO#: 0.7 10*3/uL (ref 0.1–0.9)
NEUT#: 4.4 10*3/uL (ref 1.5–6.5)
RBC: 3.83 10*6/uL (ref 3.70–5.45)
RDW: 12.7 % (ref 11.2–14.5)
WBC: 8.1 10*3/uL (ref 3.9–10.3)
lymph#: 2.7 10*3/uL (ref 0.9–3.3)

## 2011-06-02 LAB — COMPREHENSIVE METABOLIC PANEL
ALT: 21 U/L (ref 0–35)
AST: 19 U/L (ref 0–37)
Calcium: 9.7 mg/dL (ref 8.4–10.5)
Chloride: 106 mEq/L (ref 96–112)
Creatinine, Ser: 0.9 mg/dL (ref 0.50–1.10)
Potassium: 4.4 mEq/L (ref 3.5–5.3)
Sodium: 141 mEq/L (ref 135–145)
Total Protein: 6.6 g/dL (ref 6.0–8.3)

## 2011-06-02 LAB — CANCER ANTIGEN 27.29: CA 27.29: 24 U/mL (ref 0–39)

## 2011-06-03 ENCOUNTER — Other Ambulatory Visit: Payer: 59 | Admitting: Lab

## 2011-06-05 ENCOUNTER — Ambulatory Visit (INDEPENDENT_AMBULATORY_CARE_PROVIDER_SITE_OTHER): Payer: 59 | Admitting: General Surgery

## 2011-06-10 ENCOUNTER — Telehealth: Payer: Self-pay | Admitting: *Deleted

## 2011-06-10 ENCOUNTER — Ambulatory Visit (HOSPITAL_BASED_OUTPATIENT_CLINIC_OR_DEPARTMENT_OTHER): Payer: 59 | Admitting: Oncology

## 2011-06-10 VITALS — BP 141/84 | HR 90 | Temp 98.0°F | Ht 62.0 in | Wt 222.0 lb

## 2011-06-10 DIAGNOSIS — C50919 Malignant neoplasm of unspecified site of unspecified female breast: Secondary | ICD-10-CM

## 2011-06-10 NOTE — Telephone Encounter (Signed)
gave patient appointments for august with amy berry on 11-2011 and called [facility] and cancelled appointment for feb patient placed on the re-call list to be scheduled in May

## 2011-06-10 NOTE — Progress Notes (Signed)
[name]  DOB: May 17, 1955  MR#: [id number]  CSN#: [id number]    History of present illness:   [name] is a 56 year old [place] woman referred by Dr. Derrell Lolling for evaluation and treatment of newly diagnosed breast cancer.  [name] felt fine when she underwent routine mammographic screening June 12, 2009 at [facility].  Dr. Tilda Burrow noted a potential abnormality on the left breast so on the April 17, 2010 the patient was recalled for diagnostic left mammography and ultrasonography.  He found a persistent 7 mm mass with irregular margins, which by ultrasound measured 7 mm and was irregular and hypoechoic.  A 4 mm benign appearing cyst was incidentally noted anterior to this lesion.  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  With this information the patient was referred to Dr. Derrell Lolling and bilateral breast MRIs were obtained May 01, 2009.  This showed a 1.3 cm enhancing mass in the upper central portion of the left breast associated with the marker clip from the prior biopsy.  There were no other areas of concern in either breast and there were no suspicious internal mammary or axillary lymph nodes.  Accordingly on May 21, 2010 the patient underwent left lumpectomy and axillary lymph node sampling.  The results of this procedure ([id number]) confirmed an invasive ductal carcinoma, grade 2 with clear and ample margins and no evidence of lymphovascular invasion.  Both sentinel lymph nodes were clear.  HER-2/neu was repeated and was again negative.  Interval history: Xzaria returns today for followup of her breast cancer. The interval history is significant for her continuing to work full-time, with very of varying  schedules. For example she worked for shift yesterday and she will be third shift tonight. This makes it difficult for her to take her gabapentin appropriately. When she doesn't take it she has more problems with hot flashes and more insomnia. Otherwise, when she has a more stable schedule, the drug is working well for her.   Review of systems:  Aside from hot flashes she is not having any side effects that she is aware of from the letrozole. She does have some left shoulder arthritis, the pain there being on the lateral aspect, over the deltoid, rather than the medial aspect, which would be postoperative. She occasionally has stinging or stabbing pains in the left breast. She describes herself as moderately fatigued but she is working 50-70 hour weeks currently. She tells me her sugars are well controlled on metformin. She has left knee problems and she wears a brace for that. Unfortunately i she has to use steel-toed boots at work and that makes the knee problem worse.. Otherwise a detailed review of systems was noncontributory  Past medical history:      Past Medical History  Diagnosis Date  . Hypertension   . Hyperlipidemia   . GERD (gastroesophageal reflux disease)   . Cancer   60 P/Y tobacco abuse, discontinued JAN 2012 Diabetes mellitus Remote migraines osteoarthritis  Past surgical history:      Past Surgical History  Procedure Date  . Cholecystectomy   . Knee surgery   . Breast surgery     left lumpectomy  . Total abdominal hysterectomy w/ bilateral salpingoophorectomy 09/17/2110    total laparoscopic hysterectomy with  bilateral oophorectomy under the care of Dr. Seymour Bars  . Abdominal hysterectomy     Family history:     Family History  Problem Relation Age of Onset  . Heart disease Mother   . Heart disease Father   . Diabetes Father   The patient's father died at the age of 26 from heart problems.  The patient's mother died at the age of 38 with heart problems.  There is  one aunt and one uncle on each side with history of lung cancer.  There is no history of breast or ovarian cancer in the family to her knowledge.  Gynecologic history: She is GX, P0, menarche age 15.  Last menstrual period about 10 years ago.  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  Her husband of 40 years [name] is a "househusband."  They have about four acres and keep chickens, goats, and other animals.  The patient is not a church attender.     ADVANCED DIRECTIVES:  Health maintenance:       History  Substance Use Topics  . Smoking status: Current Everyday Smoker  . Smokeless tobacco: Not on file  . Alcohol Use:       Colonoscopy:  PAP:  Bone density: repeat due 2013  Cholesterol:    Allergies:    No Known Allergies  Medications:      Current Outpatient Prescriptions  Medication Sig Dispense Refill  . atenolol (TENORMIN) 100 MG tablet Take 100 mg by mouth daily.        . calcium carbonate (OS-CAL) 600 MG TABS Take 600 mg by mouth 2 (two) times daily with a meal.        . fish oil-omega-3 fatty acids 1000 MG capsule Take 1 g by mouth daily.        Marland Kitchen gabapentin (NEURONTIN) 300 MG capsule       . letrozole (FEMARA) 2.5 MG tablet       . meloxicam (MOBIC) 15 MG tablet Take 15 mg by mouth daily.        . niacin 500 MG tablet Take 500 mg by mouth daily with breakfast.        . pravastatin (PRAVACHOL) 40 MG tablet Take 40 mg by mouth daily.        . ranitidine (ZANTAC) 300 MG tablet Take 300 mg by mouth at bedtime.        Marland Kitchen buPROPion (WELLBUTRIN SR) 150 MG 12 hr tablet       . metFORMIN (GLUCOPHAGE) 1000 MG tablet         Physical exam:      Filed Vitals:   06/10/11 0854  BP: 141/84  Pulse: 90  Temp: 98 F (36.7 C)     Body mass index is 40.60 kg/(m^2).  ECOG PS: 0  Sclerae unicteric Oropharynx clear No peripheral  adenopathy Lungs no rales or rhonchi Heart regular rate and rhythm Abd benign MSK no focal spinal tenderness, no peripheral edema; fair range of motion left upper extremity, with some discomfort on extreme abduction Neuro: nonfocal Breasts: Right breast no suspicious findings. Left breast status post lumpectomy. No evidence of local recurrence   Lab results:    CBC    Component Value Date/Time   WBC 8.1 06/02/2011 0818   WBC 16.9* 09/18/2010 0640   RBC 3.83 06/02/2011 0818  RBC 3.94 09/18/2010 0640   HGB 12.5 06/02/2011 0818   HGB 12.2 09/18/2010 0640   HCT 36.6 06/02/2011 0818   HCT 38.4 09/18/2010 0640   PLT 207 06/02/2011 0818   PLT 216 09/18/2010 0640   MCV 95.5 06/02/2011 0818   MCV 97.5 09/18/2010 0640   MCH 32.7 06/02/2011 0818   MCH 31.0 09/18/2010 0640   MCHC 34.2 06/02/2011 0818   MCHC 31.8 09/18/2010 0640   RDW 12.7 06/02/2011 0818   RDW 13.8 09/18/2010 0640   LYMPHSABS 2.7 06/02/2011 0818   LYMPHSABS 3.6 05/17/2010 1139   MONOABS 0.7 06/02/2011 0818   MONOABS 0.7 05/17/2010 1139   EOSABS 0.3 06/02/2011 0818   EOSABS 0.3 05/17/2010 1139   BASOSABS 0.0 06/02/2011 0818   BASOSABS 0.1 05/17/2010 1139    Studies:      Mammogram at [facility] 24-May-2011 reportedly clear. We are requesting a copy of the report. Repeat bone density will be due May 23, 2012 Assessment: A 57 year old [place] woman   (1) status post left lumpectomy and sentinel lymph node sampling in January 2012 for a T1C N0, Stage IA  invasive ductal carcinoma, grade 2, strongly estrogen and progesterone receptor positive, HER2/neu negative, with a borderline/low MIB-1.   (2) Oncotype DX score in the low range, predicting an 8% risk of recurrence within 10 years if she took tamoxifen for 5 years.   (3) Status post radiation, completed April 2012.   (4) On letrozole 2.5 mg daily since May 2012 with good tolerance.    (5) Status post hysterectomy/bilateral salpingo-oophorectomy in May 2012   Plan: She is doing well from a  breast cancer point of view, with no evidence of recurrence. She will see Dr. Derrell Lolling again in may, see [place] again in August, and we will continue to "tag team her" on a every 3 month basis until a year from now, when we will start seeing her on an every 6 month basis. The overall plan is to continue letrozole for a total of 5 years. She knows to call for any problems that may develop before the next visit   Cashlynn Yearwood C 06/10/2011

## 2011-06-16 ENCOUNTER — Encounter (INDEPENDENT_AMBULATORY_CARE_PROVIDER_SITE_OTHER): Payer: Self-pay

## 2011-06-26 ENCOUNTER — Ambulatory Visit (INDEPENDENT_AMBULATORY_CARE_PROVIDER_SITE_OTHER): Payer: 59 | Admitting: General Surgery

## 2011-06-28 ENCOUNTER — Other Ambulatory Visit: Payer: Self-pay | Admitting: Physician Assistant

## 2011-06-28 DIAGNOSIS — C50919 Malignant neoplasm of unspecified site of unspecified female breast: Secondary | ICD-10-CM

## 2011-08-10 ENCOUNTER — Encounter: Payer: Self-pay | Admitting: Radiation Oncology

## 2011-08-11 ENCOUNTER — Ambulatory Visit
Admission: RE | Admit: 2011-08-11 | Discharge: 2011-08-11 | Disposition: A | Discharge status: Home / Self Care | Payer: 59 | Source: Ambulatory Visit | Attending: Radiation Oncology | Admitting: Radiation Oncology

## 2011-08-11 ENCOUNTER — Encounter: Payer: Self-pay | Admitting: *Deleted

## 2011-08-11 ENCOUNTER — Encounter: Payer: Self-pay | Admitting: Radiation Oncology

## 2011-08-11 VITALS — BP 115/74 | HR 72 | Temp 98.1°F | Wt 215.1 lb

## 2011-08-11 DIAGNOSIS — C50919 Malignant neoplasm of unspecified site of unspecified female breast: Secondary | ICD-10-CM

## 2011-08-11 NOTE — Progress Notes (Signed)
Here for routine follow up for left breast cancer.Continues to have hot flashes from femara but neurontin has helped with decreasing the effects. States mammogram from [facility] in December 2012 negative.Will call for report. Patient has had about a 15 lb intentional weight loss. States she is feeling great.

## 2011-08-11 NOTE — Progress Notes (Signed)
08/11/11- [facility] [id number] - 12 months post RT appt- The pt was into the cancer center this am for her 12 months post RT assessments.  She completed her questionnaires and her breast photographs were taken per the protocol.  The photographs were uploaded and sent to [facility].   The pt was seen and examined today by Dr. Roselind Messier.  Dr. Roselind Messier completed the pt's RTOG SOMA Evaluation.  The pt states that she is continuing to take her letrozole daily.  She does report some hot flashes which is relieved by her neurontin medication.  The pt denies using any skin care product on her affected breast.  The pt has not had a recurrence of her cancer.  She was thanked for her participation in the study.  She said that she would be willing to participate in further studies.  The pt will have follow up with regards to her breast cancer with Dr. Derrell Lolling and Dr. Darnelle Catalan.  Dr. Roselind Messier said that she does not need to see him again unless she has questions/concerns.

## 2011-08-11 NOTE — Progress Notes (Signed)
CC:   Lowella Dell, M.D. Angelia Mould. Derrell Lolling, M.D. Windle Guard, M.D.  DIAGNOSIS:  Left breast cancer.  INTERVAL SINCE RADIATION THERAPY:  1 year.  NARRATIVE:  [name] comes today for routine followup.  She completed breast conserving therapy back in April 2012.  Clinically, the patient seems to be doing well except for some hot flashes related to her letrozole.  The patient is on Neurontin which has helped the side effects associated with this medication.  The patient did undergo mammography in December 2012 showing no evidence of recurrence.  The patient denies any nipple discharge or bleeding.  She denies any significant pain in the left breast area.  PHYSICAL EXAMINATION:  Vital Signs:  The patient's temperature is 98 degrees, blood pressure 115/74, pulse 72.  Weight is 215 pounds.  The patient has incidentally been trying to lose weight.  Examination of the neck and supraclavicular region reveals no evidence of adenopathy.  The axillary areas are free of adenopathy.  Examination of the lungs reveals them to be clear.  The heart has a regular rhythm and rate.  Examination of the right breast reveals no mass or nipple discharge.  Examination of the left breast reveals some continued mild edema in the nipple-areolar complex area.  There are some mild hyperpigmentation changes noted. There is no dominant mass appreciated in the breast, nipple discharge or bleeding.  IMPRESSION AND PLAN:  Clinically no evidence of disease.  In light of the patient's close followup with Dr. Darnelle Catalan, I have not scheduled the patient for formal followup appointment but would be glad to see her at any time.    ______________________________ [name], Ph.D., M.D. JDK/MEDQ  D:  08/11/2011  T:  08/11/2011  Job:  [id number]

## 2011-09-10 ENCOUNTER — Ambulatory Visit (INDEPENDENT_AMBULATORY_CARE_PROVIDER_SITE_OTHER): Payer: 59 | Admitting: General Surgery

## 2011-09-10 ENCOUNTER — Encounter (INDEPENDENT_AMBULATORY_CARE_PROVIDER_SITE_OTHER): Payer: Self-pay | Admitting: General Surgery

## 2011-09-10 VITALS — BP 148/66 | HR 68 | Temp 96.8°F | Resp 16 | Ht 62.0 in | Wt 208.2 lb

## 2011-09-10 DIAGNOSIS — C50919 Malignant neoplasm of unspecified site of unspecified female breast: Secondary | ICD-10-CM

## 2011-09-10 NOTE — Progress Notes (Signed)
Patient ID: [name], female   DOB: 1956/03/07, 56 y.o.   MRN: [id number]  Chief Complaint  Patient presents with  . Breast Cancer Long Term Follow Up    HPI [name] is a 56 y.o. female.  She returns for long-term followup regarding the cancer of her left breast.  On May 21, 2010 she underwent left partial mastectomy, sentinel node biopsy and reexcision of medial margin. Pathologically this was a stage T1c., N0 tumor. Receptor positive. HER-2 negative. Ki-67 17%. She had adjuvant radiation therapy with Dr. Roselind Messier. She takes adjuvant letrozoleand and is followsedby Dr. Darnelle Catalan.  [facility] were performed on April 14, 2011 and they looked fine, category 2, no focal abnormality.  She last saw Dr. Darnelle Catalan in February and he plans to see her every August from here on out. HPI  Past Medical History  Diagnosis Date  . Hypertension   . Hyperlipidemia   . GERD (gastroesophageal reflux disease)   . Cancer 04/2010    Left Breast  . S/P radiation therapy 07/04/10 - 08/11/10    5040 cGy/28 Treatments to Left Breast  . Hot flashes     On Letrozole  . Osteoarthritis     Left Shoulder / Left Knee  . Hx of migraines     Past Surgical History  Procedure Date  . Cholecystectomy   . Knee surgery   . Breast lumpectomy 05/21/10    Left Lumpectomy and Axillary Node Sampling - Upper Central Portion; Invasive  Ductal Carcinoma, Grade 2. Negative  Nodes  . Total abdominal hysterectomy w/ bilateral salpingoophorectomy 09/17/2010    total laparoscopic hysterectomy with bilateral oophorectomy under the care of Dr. Seymour Bars  . Abdominal hysterectomy 08/2010    Family History  Problem Relation Age of Onset  . Heart disease Mother   . Heart disease Father   . Diabetes Father     Social History History  Substance Use Topics  . Smoking status: Former Smoker    Quit date: 05/07/2010  . Smokeless tobacco: Never Used  . Alcohol Use: No    No Known  Allergies  Current Outpatient Prescriptions  Medication Sig Dispense Refill  . losartan (COZAAR) 100 MG tablet Daily.      . ONE TOUCH ULTRA TEST test strip Daily.      Marland Kitchen atenolol (TENORMIN) 100 MG tablet Take 100 mg by mouth daily.        Marland Kitchen buPROPion (WELLBUTRIN SR) 150 MG 12 hr tablet       . calcium carbonate (OS-CAL) 600 MG TABS Take 600 mg by mouth 2 (two) times daily with a meal.        . cyanocobalamin 1000 MCG tablet Take 100 mcg by mouth daily.      . fish oil-omega-3 fatty acids 1000 MG capsule Take 1 g by mouth daily.        Marland Kitchen gabapentin (NEURONTIN) 300 MG capsule TAKE ONE CAPSULE AT BEDTIME  30 capsule  5  . letrozole (FEMARA) 2.5 MG tablet       . meloxicam (MOBIC) 15 MG tablet Take 15 mg by mouth daily.        . metFORMIN (GLUCOPHAGE) 1000 MG tablet       . niacin 500 MG tablet Take 500 mg by mouth daily with breakfast.        . pravastatin (PRAVACHOL) 40 MG tablet Take 40 mg by mouth daily.        . ranitidine (ZANTAC) 300 MG  tablet Take 300 mg by mouth at bedtime.          Review of Systems Review of Systems  Constitutional: Negative for fever, chills and unexpected weight change.  HENT: Negative for hearing loss, congestion, sore throat, trouble swallowing and voice change.   Eyes: Negative for visual disturbance.  Respiratory: Negative for cough and wheezing.   Cardiovascular: Negative for chest pain, palpitations and leg swelling.  Gastrointestinal: Negative for nausea, vomiting, abdominal pain, diarrhea, constipation, blood in stool, abdominal distention and anal bleeding.  Genitourinary: Negative for hematuria, vaginal bleeding and difficulty urinating.  Musculoskeletal: Negative for arthralgias.  Skin: Negative for rash and wound.  Neurological: Negative for seizures, syncope and headaches.  Hematological: Negative for adenopathy. Does not bruise/bleed easily.  Psychiatric/Behavioral: Negative for confusion.    Blood pressure 148/66, pulse 68, temperature  96.8 F (36 C), temperature source Temporal, resp. rate 16, height 5\' 2"  (1.575 m), weight 208 lb 4 oz (94.462 kg).  Physical Exam Physical Exam  Constitutional: She is oriented to person, place, and time. She appears well-developed and well-nourished. No distress.  HENT:  Head: Normocephalic and atraumatic.  Eyes: Conjunctivae and EOM are normal. Pupils are equal, round, and reactive to light. Left eye exhibits no discharge. No scleral icterus.  Neck: Neck supple. No JVD present. No tracheal deviation present. No thyromegaly present.  Cardiovascular: Normal rate, regular rhythm, normal heart sounds and intact distal pulses.   No murmur heard. Pulmonary/Chest: Effort normal and breath sounds normal. No respiratory distress. She has no wheezes. She has no rales. She exhibits no tenderness.       Transverse scar superior left breast and left axillary scar are well healed. There are no other skin changes bilaterally. There is no palpable mass bilaterally. The nipple and areolar complexes looked normal. No axillary adenopathy on either side.  Musculoskeletal: She exhibits no edema and no tenderness.  Lymphadenopathy:    She has no cervical adenopathy.  Neurological: She is alert and oriented to person, place, and time. She exhibits normal muscle tone. Coordination normal.  Skin: Skin is warm. No rash noted. She is not diaphoretic. No erythema. No pallor.  Psychiatric: She has a normal mood and affect. Her behavior is normal. Judgment and thought content normal.    Data Reviewed I reviewed Dr. Darrall Dears notes, my notes, and the recent mammograms.  Assessment    Invasive ductal carcinoma left breast, pathologic stage T1c., N0, receptor positive, HER-2 negative, Ki67 17%.  No evidence of recurrence 16 months postop left partial mastectomy, SLN biopsy, and reexcision of medial margin, adjuvant radiation therapy and adjuvant Letrozole.    Plan    She will see Dr. Darnelle Catalan in August. She  will continue the letrozole  Return to see me in May of 2014  Mammograms are scheduled for December 2013 and annually thereafter.       Mavin Dyke M 09/10/2011, 9:38 AM

## 2011-09-10 NOTE — Patient Instructions (Signed)
Your breast exam today is normal bilaterally. Your mammograms that were performed in December are also normal with no focal abnormality. There is no evidence of breast cancer on either side.  Continue the letrozole and continue to keep your appointments with Dr. Darnelle Catalan.  Return to see Dr. Derrell Lolling in one year.  Be sure to get your mammograms in December of this year.

## 2011-10-11 ENCOUNTER — Other Ambulatory Visit: Payer: Self-pay | Admitting: Oncology

## 2011-11-06 ENCOUNTER — Other Ambulatory Visit: Payer: Self-pay | Admitting: Physician Assistant

## 2011-11-06 DIAGNOSIS — C50919 Malignant neoplasm of unspecified site of unspecified female breast: Secondary | ICD-10-CM

## 2011-12-08 ENCOUNTER — Telehealth: Payer: Self-pay | Admitting: *Deleted

## 2011-12-08 ENCOUNTER — Ambulatory Visit (HOSPITAL_BASED_OUTPATIENT_CLINIC_OR_DEPARTMENT_OTHER): Payer: 59 | Admitting: Physician Assistant

## 2011-12-08 ENCOUNTER — Other Ambulatory Visit (HOSPITAL_BASED_OUTPATIENT_CLINIC_OR_DEPARTMENT_OTHER): Payer: 59 | Admitting: Lab

## 2011-12-08 ENCOUNTER — Encounter: Payer: Self-pay | Admitting: Physician Assistant

## 2011-12-08 VITALS — BP 143/79 | HR 66 | Temp 97.1°F | Resp 20 | Ht 62.0 in | Wt 194.9 lb

## 2011-12-08 DIAGNOSIS — Z853 Personal history of malignant neoplasm of breast: Secondary | ICD-10-CM

## 2011-12-08 DIAGNOSIS — C50919 Malignant neoplasm of unspecified site of unspecified female breast: Secondary | ICD-10-CM

## 2011-12-08 DIAGNOSIS — C50119 Malignant neoplasm of central portion of unspecified female breast: Secondary | ICD-10-CM

## 2011-12-08 DIAGNOSIS — Z170 Estrogen receptor positive status [ER+]: Secondary | ICD-10-CM

## 2011-12-08 LAB — COMPREHENSIVE METABOLIC PANEL
ALT: 15 U/L (ref 0–35)
Albumin: 4.3 g/dL (ref 3.5–5.2)
Alkaline Phosphatase: 113 U/L (ref 39–117)
Glucose, Bld: 91 mg/dL (ref 70–99)
Potassium: 4.4 mEq/L (ref 3.5–5.3)
Sodium: 140 mEq/L (ref 135–145)
Total Bilirubin: 0.7 mg/dL (ref 0.3–1.2)
Total Protein: 6.8 g/dL (ref 6.0–8.3)

## 2011-12-08 LAB — CBC WITH DIFFERENTIAL/PLATELET
BASO%: 1.3 % (ref 0.0–2.0)
Eosinophils Absolute: 0.4 10*3/uL (ref 0.0–0.5)
MCHC: 33.6 g/dL (ref 31.5–36.0)
MCV: 95.2 fL (ref 79.5–101.0)
MONO#: 0.6 10*3/uL (ref 0.1–0.9)
MONO%: 8.3 % (ref 0.0–14.0)
NEUT#: 4 10*3/uL (ref 1.5–6.5)
RBC: 3.72 10*6/uL (ref 3.70–5.45)
RDW: 13.4 % (ref 11.2–14.5)
WBC: 6.7 10*3/uL (ref 3.9–10.3)

## 2011-12-08 NOTE — Telephone Encounter (Signed)
Gave patient appointment for mammogram and bone density on 04-14-2012 starting at 9:00am gave patient appointment for lab being one week before the md appointment

## 2011-12-08 NOTE — Progress Notes (Signed)
[name]  DOB: [birth year]  MR#: [id number]  CSN#: [id number]    History of present illness:   [name] is a 56 year old [place] woman referred by Dr. Derrell Lolling for evaluation and treatment of newly diagnosed breast cancer.  [name] felt fine when she underwent routine mammographic screening June 12, 2009 at [facility].  Dr. Tilda Burrow noted a potential abnormality on the left breast so on the April 17, 2010 the patient was recalled for diagnostic left mammography and ultrasonography.  He found a persistent 7 mm mass with irregular margins, which by ultrasound measured 7 mm and was irregular and hypoechoic.  A 4 mm benign appearing cyst was incidentally noted anterior to this lesion.  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  With this information the patient was referred to Dr. Derrell Lolling and bilateral breast MRIs were obtained May 01, 2009.  This showed a 1.3 cm enhancing mass in the upper central portion of the left breast associated with the marker clip from the prior biopsy.  There were no other areas of concern in either breast and there were no suspicious internal mammary or axillary lymph nodes.  Accordingly on May 21, 2010 the patient underwent left lumpectomy and axillary lymph node sampling.  The results of this procedure ([id number]) confirmed an invasive ductal carcinoma, grade 2 with clear and ample margins and no evidence of lymphovascular invasion.  Both sentinel lymph nodes were clear.  HER-2/neu was repeated and was again negative.  Interval history: [name] returns today for followup of her breast cancer. The interval history is significant for her continuing to work full-time.  Her schedule alternates  between first shift and third shift, making it difficult for [name] to maintain a regular routine. Her husband would love for her to retire.  [name] continues on her letrozole which overall she tolerates well. She continues to have some hot flashes which are treated with gabapentin. She has noticed some increased pain in her lower tremor these, primarily her hips and knees, but tells me she has "good days and bad days". She takes Aleve for the pain, and at this time feels it is not affecting her quality of life and not to go off of the letrozole.    Review of systems:    Otherwise, [name] has had no recent illnesses and denies fevers or chills. No rashes or skin changes. No abnormal bleeding. Her diabetes is well controlled she tells me, and she continues on metformin. She's eating and drinking well with no nausea or change in bowel habits. No cough or increased shortness of breath. No chest pain or palpitations. No abnormal headaches or dizziness. Other than the hip and knee pain as noted above, no additional myalgias or arthralgias elsewhere.  A detailed review of systems is otherwise noncontributory.   Past medical history:      Past Medical History  Diagnosis Date  . Hypertension   . Hyperlipidemia   . GERD (gastroesophageal reflux disease)   . Cancer 04/2010    Left Breast  . S/P radiation therapy 07/04/10 - 08/11/10    5040 cGy/28 Treatments to Left Breast  . Hot flashes     On Letrozole  . Osteoarthritis     Left Shoulder / Left Knee  .  Hx of migraines   60 P/Y tobacco abuse, discontinued JAN 2012 Diabetes mellitus Remote migraines osteoarthritis  Past surgical history:      Past Surgical History  Procedure Date  . Cholecystectomy   . Knee surgery   . Breast lumpectomy 05/21/10    Left Lumpectomy and Axillary Node Sampling - Upper Central Portion; Invasive  Ductal Carcinoma, Grade 2. Negative  Nodes  . Total abdominal hysterectomy w/ bilateral salpingoophorectomy  09/17/2010    total laparoscopic hysterectomy with bilateral oophorectomy under the care of Dr. Seymour Bars  . Abdominal hysterectomy 08/2010    Family history:     Family History  Problem Relation Age of Onset  . Heart disease Mother   . Heart disease Father   . Diabetes Father   The patient's father died at the age of 78 from heart problems.  The patient's mother died at the age of 25 with heart problems.  There is one aunt and one uncle on each side with history of lung cancer.  There is no history of breast or ovarian cancer in the family to her knowledge.  Gynecologic history: She is GX, P0, menarche age 56.  Last menstrual period about 10 years ago.  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  Her husband of 65 years [name] is a "househusband."  They have about four acres and keep chickens, goats, and other animals.  The patient is not a church attender.     ADVANCED DIRECTIVES:  Health maintenance:       History  Substance Use Topics  . Smoking status: Former Smoker    Quit date: 05/07/2010  . Smokeless tobacco: Never Used  . Alcohol Use: No      Colonoscopy:  Due in 2014  PAP: Due now  Bone density: repeat due 2013  Cholesterol: Followed by PCP    Allergies:    No Known Allergies  Medications:      Current Outpatient Prescriptions  Medication Sig Dispense Refill  . atenolol (TENORMIN) 100 MG tablet Take 100 mg by mouth daily.        Marland Kitchen buPROPion (WELLBUTRIN SR) 150 MG 12 hr tablet       . calcium carbonate (OS-CAL) 600 MG TABS Take 600 mg by mouth 2 (two) times daily with a meal.        . cyanocobalamin 1000 MCG tablet Take 100 mcg by mouth daily.      . fish oil-omega-3 fatty acids 1000 MG capsule Take 1 g by mouth daily.        Marland Kitchen gabapentin (NEURONTIN) 300 MG capsule TAKE ONE CAPSULE AT BEDTIME  30 capsule  1  .  glucosamine-chondroitin 500-400 MG tablet Take 1 tablet by mouth 3 (three) times daily.      Marland Kitchen letrozole (FEMARA) 2.5 MG tablet TAKE ONE (1) TABLET EACH DAY  90 tablet  4  . losartan (COZAAR) 100 MG tablet Daily.      . metFORMIN (GLUCOPHAGE) 1000 MG tablet Take by mouth 2 (two) times daily with a meal. 1000mg  po QAM and 500 mg PO QHS      . naproxen sodium (ANAPROX) 220 MG tablet Take 220 mg by mouth 2 (two) times daily with a meal.      . niacin 500 MG tablet Take 500 mg by mouth daily with breakfast.        .  ONE TOUCH ULTRA TEST test strip Daily.      . pravastatin (PRAVACHOL) 40 MG tablet Take 40 mg by mouth daily.        . ranitidine (ZANTAC) 300 MG tablet Take 300 mg by mouth at bedtime.        . meloxicam (MOBIC) 15 MG tablet Take 15 mg by mouth daily.          Physical exam:     Middle-aged white female who appears comfortable and in no acute distress. Filed Vitals:   12/08/11 0846  BP: 143/79  Pulse: 66  Temp: 97.1 F (36.2 C)  Resp: 20     Body mass index is 35.65 kg/(m^2).  ECOG PS: 0  Filed Weights   12/08/11 0846  Weight: 194 lb 14.4 oz (88.406 kg)   Physical Exam: HEENT:  Sclerae anicteric.  Oropharynx clear.   Nodes:  No cervical, supraclavicular, or axillary lymphadenopathy palpated.  Breast Exam:  Right breast is benign no masses, skin changes, or nipple inversion. Left breast is status post lumpectomy with well-healed incision. No suspicious nodularities or evidence of local recurrence. Lungs:  Clear to auscultation bilaterally.  No crackles, rhonchi, or wheezes.   Heart:  Regular rate and rhythm.   Abdomen:  Soft, nontender.  Positive bowel sounds.  No organomegaly or masses palpated.   Musculoskeletal:  No focal spinal tenderness to palpation.  Extremities:  Benign.  No peripheral edema or cyanosis.   Skin:  Benign.   Neuro:  Nonfocal. Alert and oriented x3.     Lab results:    CBC    Component Value Date/Time   WBC 6.7 12/08/2011 0806   WBC 16.9*  09/18/2010 0640   RBC 3.72 12/08/2011 0806   RBC 3.94 09/18/2010 0640   HGB 11.9 12/08/2011 0806   HGB 12.2 09/18/2010 0640   HCT 35.4 12/08/2011 0806   HCT 38.4 09/18/2010 0640   PLT 186 12/08/2011 0806   PLT 216 09/18/2010 0640   MCV 95.2 12/08/2011 0806   MCV 97.5 09/18/2010 0640   MCH 32.0 12/08/2011 0806   MCH 31.0 09/18/2010 0640   MCHC 33.6 12/08/2011 0806   MCHC 31.8 09/18/2010 0640   RDW 13.4 12/08/2011 0806   RDW 13.8 09/18/2010 0640   LYMPHSABS 1.7 12/08/2011 0806   LYMPHSABS 3.6 05/17/2010 1139   MONOABS 0.6 12/08/2011 0806   MONOABS 0.7 05/17/2010 1139   EOSABS 0.4 12/08/2011 0806   EOSABS 0.3 05/17/2010 1139   BASOSABS 0.1 12/08/2011 0806   BASOSABS 0.1 05/17/2010 1139    Studies:      Mammogram at [facility] 05/21/2011 reportedly clear. Repeat bone density will be due May 20, 2012 Assessment: A 56 year old [place] woman   (1) status post left lumpectomy and sentinel lymph node sampling in January 2012 for a T1C N0, Stage IA  invasive ductal carcinoma, grade 2, strongly estrogen and progesterone receptor positive, HER2/neu negative, with a borderline/low MIB-1.   (2) Oncotype DX score in the low range, predicting an 8% risk of recurrence within 10 years if she took tamoxifen for 5 years.   (3) Status post radiation, completed April 2012.   (4) On letrozole 2.5 mg daily since May 2012 with good tolerance.    (5) Status post hysterectomy/bilateral salpingo-oophorectomy in May 2012   Plan:  Miya continues to do well, and at this time there is no clinical evidence of disease recurrence. We'll continue to alternate her appointments between our office and Dr. Jacinto Halim office.  Accordingly, she will be seeing Dr. Derrell Lolling in November, then see [place] again in February. She is also being scheduled for her routine mammogram and bone density, both of which will be obtained at [facility] in December of this year.   The overall plan is to continue letrozole for a total of 5 years. She knows to call  for any problems that may develop before the next visit   Chariti Havel 12/08/2011

## 2011-12-12 ENCOUNTER — Telehealth: Payer: Self-pay | Admitting: *Deleted

## 2011-12-12 NOTE — Telephone Encounter (Signed)
Patient was placed on the recall list for central [facility] surgery

## 2011-12-14 ENCOUNTER — Other Ambulatory Visit: Payer: Self-pay | Admitting: Oncology

## 2012-02-19 ENCOUNTER — Encounter (INDEPENDENT_AMBULATORY_CARE_PROVIDER_SITE_OTHER): Payer: Self-pay | Admitting: General Surgery

## 2012-02-19 ENCOUNTER — Ambulatory Visit (INDEPENDENT_AMBULATORY_CARE_PROVIDER_SITE_OTHER): Payer: 59 | Admitting: General Surgery

## 2012-02-19 VITALS — BP 148/82 | HR 76 | Temp 98.0°F | Resp 14 | Ht 62.0 in | Wt 190.6 lb

## 2012-02-19 DIAGNOSIS — C50919 Malignant neoplasm of unspecified site of unspecified female breast: Secondary | ICD-10-CM

## 2012-02-19 NOTE — Progress Notes (Signed)
Patient ID: [name], female   DOB: 01/25/1956, 56 y.o.   MRN: [id number]  No chief complaint on file.   HPI [name] is a 56 y.o. female.  She returns for long-term followup of her left breast cancer.  On 05/21/2010 this patient underwent left partial mastectomy, sentinel lymph node biopsy and reexcision of medial margin. She had a hormone receptor positive, HER-2-negative, Ki 67 17% tumor. Pathologic stage TI C., N0. She had adjuvant radiation therapy. She has done well. She is on letrozole and sees Dr. Darnelle Catalan at least every 6 months. She is due for mammograms in December  She has no complaints about her breast. She has no new medical problems. She is followed by [facility]. HPI  Past Medical History  Diagnosis Date  . Hypertension   . Hyperlipidemia   . GERD (gastroesophageal reflux disease)   . Cancer 04/2010    Left Breast  . S/P radiation therapy 07/04/10 - 08/11/10    5040 cGy/28 Treatments to Left Breast  . Hot flashes     On Letrozole  . Osteoarthritis     Left Shoulder / Left Knee  . Hx of migraines     Past Surgical History  Procedure Date  . Cholecystectomy   . Knee surgery   . Breast lumpectomy 05/21/10    Left Lumpectomy and Axillary Node Sampling - Upper Central Portion; Invasive  Ductal Carcinoma, Grade 2. Negative  Nodes  . Total abdominal hysterectomy w/ bilateral salpingoophorectomy 09/17/2010    total laparoscopic hysterectomy with bilateral oophorectomy under the care of Dr. Seymour Bars  . Abdominal hysterectomy 08/2010    Family History  Problem Relation Age of Onset  . Heart disease Mother   . Heart disease Father   . Diabetes Father     Social History History  Substance Use Topics  . Smoking status: Former Smoker    Quit date: 05/07/2010  . Smokeless tobacco: Never Used  . Alcohol Use: No    No Known Allergies  Current Outpatient Prescriptions  Medication Sig Dispense Refill  . atenolol (TENORMIN) 100 MG tablet Take 100 mg by  mouth daily.        Marland Kitchen buPROPion (WELLBUTRIN SR) 150 MG 12 hr tablet       . calcium carbonate (OS-CAL) 600 MG TABS Take 600 mg by mouth 2 (two) times daily with a meal.        . cyanocobalamin 1000 MCG tablet Take 100 mcg by mouth daily.      . fish oil-omega-3 fatty acids 1000 MG capsule Take 1 g by mouth daily.        Marland Kitchen gabapentin (NEURONTIN) 300 MG capsule TAKE ONE CAPSULE BY MOUTH AT BEDTIME  30 capsule  6  . glucosamine-chondroitin 500-400 MG tablet Take 1 tablet by mouth 3 (three) times daily.      Marland Kitchen letrozole (FEMARA) 2.5 MG tablet TAKE ONE (1) TABLET EACH DAY  90 tablet  4  . losartan (COZAAR) 100 MG tablet Daily.      . meloxicam (MOBIC) 15 MG tablet Take 15 mg by mouth daily.        . metFORMIN (GLUCOPHAGE) 1000 MG tablet Take by mouth 2 (two) times daily with a meal. 1000mg  po QAM and 500 mg PO QHS      . naproxen sodium (ANAPROX) 220 MG tablet Take 220 mg by mouth 2 (two) times daily with a meal.      . niacin 500 MG tablet Take 500  mg by mouth daily with breakfast.        . ONE TOUCH ULTRA TEST test strip Daily.      . pravastatin (PRAVACHOL) 40 MG tablet Take 40 mg by mouth daily.        . ranitidine (ZANTAC) 300 MG tablet Take 300 mg by mouth at bedtime.          Review of Systems Review of Systems  Constitutional: Negative for fever, chills and unexpected weight change.  HENT: Negative for hearing loss, congestion, sore throat, trouble swallowing and voice change.   Eyes: Negative for visual disturbance.  Respiratory: Negative for cough and wheezing.   Cardiovascular: Negative for chest pain, palpitations and leg swelling.  Gastrointestinal: Negative for nausea, vomiting, abdominal pain, diarrhea, constipation, blood in stool, abdominal distention and anal bleeding.  Genitourinary: Negative for hematuria, vaginal bleeding and difficulty urinating.  Musculoskeletal: Negative for arthralgias.  Skin: Negative for rash and wound.  Neurological: Negative for seizures, syncope  and headaches.  Hematological: Negative for adenopathy. Does not bruise/bleed easily.  Psychiatric/Behavioral: Negative for confusion.    Blood pressure 148/82, pulse 76, temperature 98 F (36.7 C), resp. rate 14, height 5\' 2"  (1.575 m), weight 190 lb 9.6 oz (86.456 kg).  Physical Exam Physical Exam  Constitutional: She is oriented to person, place, and time. She appears well-developed and well-nourished. No distress.  HENT:  Head: Normocephalic and atraumatic.  Eyes: Conjunctivae normal and EOM are normal. Pupils are equal, round, and reactive to light. Left eye exhibits no discharge. No scleral icterus.  Neck: Neck supple. No JVD present. No tracheal deviation present. No thyromegaly present.  Cardiovascular: Normal rate, regular rhythm, normal heart sounds and intact distal pulses.   No murmur heard. Pulmonary/Chest: Effort normal and breath sounds normal. No respiratory distress. She has no wheezes. She has no rales. She exhibits no tenderness.       Bilateral breast exam reveals no palpable mass, no pathologic skin changes, and no axillary adenopathy. Well-healed lumpectomy scar left breast at 12:00 and well-healed left axillary scar. No arm swelling or sensory deficit.  Musculoskeletal: She exhibits no edema and no tenderness.  Lymphadenopathy:    She has no cervical adenopathy.  Neurological: She is alert and oriented to person, place, and time. She exhibits normal muscle tone.  Skin: Skin is warm and dry. No rash noted. She is not diaphoretic. No erythema. No pallor.  Psychiatric: She has a normal mood and affect. Her behavior is normal. Judgment and thought content normal.    Data Reviewed My old records. Notes from [facility] cancer center.  Assessment    Invasive mammary carcinoma left breast, 12:00 position, pathologic stage is T1 C., N0, receptor positive, HER-2-negative.  No evidence of recurrence 21 months following left partial mastectomy, sentinel lymph biopsy and  adjuvant radiation therapy with ongoing adjuvant antiestrogen therapy.    Plan    Continue regular appointment to Dr. Darnelle Catalan every 6 months or so. Continue letrozole.  Annual mammograms in December the shear  Considering her close clinical followup with Dr. Darnelle Catalan, I will see her back in January 2015 after her December mammograms       Westyn Keatley M. Derrell Lolling, M.D., [facility] General and Minimally invasive Surgery Breast and Colorectal Surgery Office:   [phone] Pager:   [phone]  02/19/2012, 10:20 AM

## 2012-02-19 NOTE — Patient Instructions (Signed)
You are doing well. Your physical exam shows no evidence of cancer.  Be sure to get your mammograms in December 2013 and an annually in December thereafter.  Keep your regular appointment with Dr. Darnelle Catalan.  Return to see Dr. Derrell Lolling in January, 2015 after your December mammograms.

## 2012-06-01 ENCOUNTER — Ambulatory Visit (HOSPITAL_BASED_OUTPATIENT_CLINIC_OR_DEPARTMENT_OTHER): Payer: 59 | Admitting: Lab

## 2012-06-01 ENCOUNTER — Telehealth: Payer: Self-pay | Admitting: Oncology

## 2012-06-01 DIAGNOSIS — C50919 Malignant neoplasm of unspecified site of unspecified female breast: Secondary | ICD-10-CM

## 2012-06-01 LAB — CBC WITH DIFFERENTIAL/PLATELET
Basophils Absolute: 0.1 10*3/uL (ref 0.0–0.1)
EOS%: 3.7 % (ref 0.0–7.0)
HCT: 36.7 % (ref 34.8–46.6)
HGB: 12.2 g/dL (ref 11.6–15.9)
LYMPH%: 23.4 % (ref 14.0–49.7)
MCH: 31.2 pg (ref 25.1–34.0)
MCV: 93.9 fL (ref 79.5–101.0)
NEUT%: 67.4 % (ref 38.4–76.8)
Platelets: 212 10*3/uL (ref 145–400)
lymph#: 2 10*3/uL (ref 0.9–3.3)

## 2012-06-01 LAB — COMPREHENSIVE METABOLIC PANEL (CC13)
ALT: 18 U/L (ref 0–55)
Albumin: 3.8 g/dL (ref 3.5–5.0)
CO2: 25 mEq/L (ref 22–29)
Chloride: 104 mEq/L (ref 98–107)
Glucose: 84 mg/dl (ref 70–99)
Potassium: 4.4 mEq/L (ref 3.5–5.1)
Sodium: 140 mEq/L (ref 136–145)
Total Bilirubin: 0.49 mg/dL (ref 0.20–1.20)
Total Protein: 7.1 g/dL (ref 6.4–8.3)

## 2012-06-02 ENCOUNTER — Other Ambulatory Visit: Payer: 59 | Admitting: Lab

## 2012-06-10 ENCOUNTER — Ambulatory Visit: Payer: 59 | Admitting: Oncology

## 2012-07-22 ENCOUNTER — Other Ambulatory Visit: Payer: Self-pay | Admitting: *Deleted

## 2012-07-22 DIAGNOSIS — C50919 Malignant neoplasm of unspecified site of unspecified female breast: Secondary | ICD-10-CM

## 2012-07-22 MED ORDER — GABAPENTIN 300 MG PO CAPS: ORAL_CAPSULE | ORAL | Status: DC

## 2012-07-26 ENCOUNTER — Ambulatory Visit (HOSPITAL_BASED_OUTPATIENT_CLINIC_OR_DEPARTMENT_OTHER): Payer: 59 | Admitting: Oncology

## 2012-07-26 ENCOUNTER — Telehealth: Payer: Self-pay | Admitting: *Deleted

## 2012-07-26 ENCOUNTER — Other Ambulatory Visit: Payer: Self-pay | Admitting: Oncology

## 2012-07-26 VITALS — BP 142/77 | HR 76 | Temp 97.9°F | Resp 20 | Ht 62.0 in | Wt 189.1 lb

## 2012-07-26 DIAGNOSIS — Z170 Estrogen receptor positive status [ER+]: Secondary | ICD-10-CM

## 2012-07-26 DIAGNOSIS — M79609 Pain in unspecified limb: Secondary | ICD-10-CM

## 2012-07-26 DIAGNOSIS — N6009 Solitary cyst of unspecified breast: Secondary | ICD-10-CM

## 2012-07-26 DIAGNOSIS — C50912 Malignant neoplasm of unspecified site of left female breast: Secondary | ICD-10-CM

## 2012-07-26 DIAGNOSIS — C50919 Malignant neoplasm of unspecified site of unspecified female breast: Secondary | ICD-10-CM

## 2012-07-26 NOTE — Telephone Encounter (Signed)
appts made and printed 

## 2012-07-26 NOTE — Progress Notes (Signed)
[name]  DOB: 1955/05/18  MR#: [id number]  CSN#: [id number]    History of present illness:   [name] is a 57 year old [place] woman referred by Dr. Derrell Lolling for evaluation and treatment of newly diagnosed breast cancer.  [name] felt fine when she underwent routine mammographic screening June 12, 2009 at [facility].  Dr. Tilda Burrow noted a potential abnormality on the left breast so on the April 17, 2010 the patient was recalled for diagnostic left mammography and ultrasonography.  He found a persistent 7 mm mass with irregular margins, which by ultrasound measured 7 mm and was irregular and hypoechoic.  A 4 mm benign appearing cyst was incidentally noted anterior to this lesion.  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  With this information the patient was referred to Dr. Derrell Lolling and bilateral breast MRIs were obtained May 01, 2009.  This showed a 1.3 cm enhancing mass in the upper central portion of the left breast associated with the marker clip from the prior biopsy.  There were no other areas of concern in either breast and there were no suspicious internal mammary or axillary lymph nodes.  Accordingly on May 21, 2010 the patient underwent left lumpectomy and axillary lymph node sampling.  The results of this procedure ([id number]) confirmed an invasive ductal carcinoma, grade 2 with clear and ample margins and no evidence of lymphovascular invasion.  Both sentinel lymph nodes were clear.  HER-2/neu was repeated and was again negative.  Interval history: [name] returns today for followup of her breast cancer. The interval history is unremarkable. She and her husband are doing more exercise, and she tells  me she is getting her blood sugar are under much better control.  Review of systems:   She is having pain in her "bones", keeping her from sleep. This starts as soon as she gets into bed and sometimes it wakes her up in the middle of the night. This has been coming on for the past couple of months. The pain is mostly in the palms of the hands, and it is likely stinging pain. She has a little bit of discomfort as well on her shins bilaterally. She saw Dr. Jeannetta Nap regarding this, and also orthopedics Leilani Able). She got a cortisone shot in her left knee which helped. Aside from these issues, a detailed review of systems today was noncontributory  Past medical history:      Past Medical History  Diagnosis Date  . Hypertension   . Hyperlipidemia   . GERD (gastroesophageal reflux disease)   . Cancer 04/2010    Left Breast  . S/P radiation therapy 07/04/10 - 08/11/10    5040 cGy/28 Treatments to Left Breast  . Hot flashes     On Letrozole  . Osteoarthritis     Left Shoulder / Left Knee  . Hx of migraines   60 P/Y tobacco abuse, discontinued JAN 2012 Diabetes mellitus Remote migraines osteoarthritis  Past surgical history:      Past Surgical History  Procedure Laterality Date  . Cholecystectomy    . Knee surgery    . Breast lumpectomy  05/21/10    Left Lumpectomy and Axillary Node Sampling - Upper Central Portion; Invasive  Ductal Carcinoma, Grade 2. Negative  Nodes  .  Total abdominal hysterectomy w/ bilateral salpingoophorectomy  09/17/2010    total laparoscopic hysterectomy with bilateral oophorectomy under the care of Dr. Seymour Bars  . Abdominal hysterectomy  08/2010    Family history:     Family History  Problem Relation Age of Onset  . Heart disease Mother   . Heart disease Father   . Diabetes Father   The patient's father died at the age of 84 from heart problems.  The patient's mother died at the age of 51 with heart problems.  There is one aunt and one uncle on each side with  history of lung cancer.  There is no history of breast or ovarian cancer in the family to her knowledge.  Gynecologic history: She is GX, P0, menarche age 29.  Last menstrual period about 10 years ago.  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  Her husband of 72 years [name] is a "househusband."  They have about four acres and keep chickens, goats, and other animals.  The patient is not a church attender.     ADVANCED DIRECTIVES:  Health maintenance:       History  Substance Use Topics  . Smoking status: Former Smoker    Quit date: 05/07/2010  . Smokeless tobacco: Never Used  . Alcohol Use: No      Colonoscopy:  Due in 2014  PAP: Due now  Bone density: repeat due 2013  Cholesterol: Followed by PCP    Allergies:    No Known Allergies  Medications:      Current Outpatient Prescriptions  Medication Sig Dispense Refill  . atenolol (TENORMIN) 100 MG tablet Take 100 mg by mouth daily.        Marland Kitchen buPROPion (WELLBUTRIN SR) 150 MG 12 hr tablet       . calcium carbonate (OS-CAL) 600 MG TABS Take 600 mg by mouth 2 (two) times daily with a meal.        . cyanocobalamin 1000 MCG tablet Take 100 mcg by mouth daily.      . fish oil-omega-3 fatty acids 1000 MG capsule Take 1 g by mouth daily.        Marland Kitchen gabapentin (NEURONTIN) 300 MG capsule TAKE ONE CAPSULE BY MOUTH AT BEDTIME  30 capsule  0  . glucosamine-chondroitin 500-400 MG tablet Take 1 tablet by mouth 3 (three) times daily.      Marland Kitchen letrozole (FEMARA) 2.5 MG tablet TAKE ONE (1) TABLET EACH DAY  90 tablet  4  . losartan (COZAAR) 100 MG tablet Daily.      . meloxicam (MOBIC) 15 MG tablet Take 15 mg by mouth daily.        . metFORMIN (GLUCOPHAGE) 1000 MG tablet Take by mouth 2 (two) times daily with a meal. 1000mg  po QAM and 500 mg PO QHS      . naproxen sodium (ANAPROX) 220 MG tablet Take 220 mg by mouth 2  (two) times daily with a meal.      . niacin 500 MG tablet Take 500 mg by mouth daily with breakfast.        . ONE TOUCH ULTRA TEST test strip Daily.      . pravastatin (PRAVACHOL) 40 MG tablet Take 40 mg by mouth daily.        . ranitidine (ZANTAC) 300 MG tablet Take 300 mg by mouth  at bedtime.         No current facility-administered medications for this visit.    Physical exam:     Middle-aged white female in no acute distress. Filed Vitals:   07/26/12 0907  BP: 142/77  Pulse: 76  Temp: 97.9 F (36.6 C)  Resp: 20     Body mass index is 34.57 kg/(m^2).  ECOG PS: 0  Filed Weights   07/26/12 0907  Weight: 189 lb 0.8 oz (85.753 kg)   Sclerae unicteric Oropharynx clear No cervical or supraclavicular adenopathy Lungs no rales or rhonchi Heart regular rate and rhythm Abd soft, obese, nontender, positive bowel sounds MSK no focal spinal tenderness, no peripheral edema; minimal osteoarthritic changes over the hands; no knee swelling or erythema, no crepitus Neuro: nonfocal, well oriented, pleasant affect Breasts: The right breast is unremarkable. The left breast is status post lumpectomy and radiation, just below the scar there is a focal area of induration without associated erythema, swelling, or tenderness. The left axilla is benign  Lab results:    CBC    Component Value Date/Time   WBC 8.5 06/01/2012 0850   WBC 16.9* 09/18/2010 0640   RBC 3.91 06/01/2012 0850   RBC 3.94 09/18/2010 0640   HGB 12.2 06/01/2012 0850   HGB 12.2 09/18/2010 0640   HCT 36.7 06/01/2012 0850   HCT 38.4 09/18/2010 0640   PLT 212 06/01/2012 0850   PLT 216 09/18/2010 0640   MCV 93.9 06/01/2012 0850   MCV 97.5 09/18/2010 0640   MCH 31.2 06/01/2012 0850   MCH 31.0 09/18/2010 0640   MCHC 33.2 06/01/2012 0850   MCHC 31.8 09/18/2010 0640   RDW 13.5 06/01/2012 0850   RDW 13.8 09/18/2010 0640   LYMPHSABS 2.0 06/01/2012 0850   LYMPHSABS 3.6 05/17/2010 1139   MONOABS 0.4 06/01/2012 0850   MONOABS 0.7 05/17/2010 1139   EOSABS 0.3  06/01/2012 0850   EOSABS 0.3 05/17/2010 1139   BASOSABS 0.1 06/01/2012 0850   BASOSABS 0.1 05/17/2010 1139   BMET    Component Value Date/Time   NA 140 06/01/2012 0850   NA 140 12/08/2011 0806   K 4.4 06/01/2012 0850   K 4.4 12/08/2011 0806   CL 104 06/01/2012 0850   CL 101 12/08/2011 0806   CO2 25 06/01/2012 0850   CO2 28 12/08/2011 0806   GLUCOSE 84 06/01/2012 0850   GLUCOSE 91 12/08/2011 0806   BUN 21.7 06/01/2012 0850   BUN 27* 12/08/2011 0806   CREATININE 1.0 06/01/2012 0850   CREATININE 0.95 12/08/2011 0806   CALCIUM 10.2 06/01/2012 0850   CALCIUM 9.8 12/08/2011 0806   GFRNONAA >60 09/09/2010 1020   GFRAA  Value: >60        The eGFR has been calculated using the MDRD equation. This calculation has not been validated in all clinical situations. eGFR's persistently <60 mL/min signify possible Chronic Kidney Disease. 09/09/2010 1020     Studies:    Mammography at [facility] June 02, 2022 to 2014 showed a focal area of mixed echogenicity in the left breast at the 12:00 position 5 cm from the nipple, measuring 1.4 cm. There was no flow on Doppler. This suggests a fat necrosis. Bone density at [facility] 04/29/2012 was normal.  Assessment: 57 y.o.  [place] woman   (1) status post left lumpectomy and sentinel lymph node sampling in 2012-02-05for a T1C N0, Stage IA  invasive ductal carcinoma, grade 2, strongly estrogen and progesterone receptor positive, HER2/neu negative, with a borderline/low MIB-1.   (  2) Oncotype DX score in the low range, predicting an 8% risk of recurrence within 10 years if she took tamoxifen for 5 years.   (3) Status post radiation, completed April 2012.   (4) On letrozole 2.5 mg daily since May 2012 with good tolerance.    (5) Status post hysterectomy/bilateral salpingo-oophorectomy in May 2012   Plan:  I am concerned that the letrozole might be causing some of her symptoms. On the other hand she has been on this medication for 2 years with good tolerance. What has changed his that she is  exercising more. It could be that what she is experiencing at night his carpal tunnel, I am referring her to [facility] for further evaluation. If carpal tunnel is not the problem, and the symptoms persist, we will switch her to tamoxifen at the next visit.  Normally I would start seeing her on a once a year basis at this point, but with the concern regarding the area of likely fat necrosis in the left breast (repeat mammogram pending 6 months from now) and this pain issue, we are going to see her again in 6 months. She knows to call for any other problems that may develop before then.   Aidyn Sportsman C 07/26/2012

## 2012-08-06 ENCOUNTER — Other Ambulatory Visit: Payer: Self-pay | Admitting: *Deleted

## 2012-08-06 DIAGNOSIS — C50919 Malignant neoplasm of unspecified site of unspecified female breast: Secondary | ICD-10-CM

## 2012-08-06 MED ORDER — GABAPENTIN 300 MG PO CAPS: ORAL_CAPSULE | ORAL | Status: DC

## 2012-08-26 DIAGNOSIS — G5601 Carpal tunnel syndrome, right upper limb: Secondary | ICD-10-CM

## 2012-08-26 DIAGNOSIS — G5621 Lesion of ulnar nerve, right upper limb: Secondary | ICD-10-CM

## 2012-08-26 HISTORY — DX: Carpal tunnel syndrome, right upper limb: G56.01

## 2012-08-26 HISTORY — DX: Lesion of ulnar nerve, right upper limb: G56.21

## 2012-09-07 ENCOUNTER — Other Ambulatory Visit: Payer: Self-pay | Admitting: Orthopedic Surgery

## 2012-09-13 ENCOUNTER — Encounter (HOSPITAL_BASED_OUTPATIENT_CLINIC_OR_DEPARTMENT_OTHER): Payer: Self-pay | Admitting: *Deleted

## 2012-09-13 NOTE — Pre-Procedure Instructions (Signed)
To come for BMET and EKG 

## 2012-09-17 ENCOUNTER — Other Ambulatory Visit: Payer: Self-pay

## 2012-09-17 ENCOUNTER — Encounter (HOSPITAL_BASED_OUTPATIENT_CLINIC_OR_DEPARTMENT_OTHER)
Admission: RE | Admit: 2012-09-17 | Discharge: 2012-09-17 | Disposition: A | Discharge status: Home / Self Care | Payer: 59 | Source: Ambulatory Visit | Attending: Orthopedic Surgery | Admitting: Orthopedic Surgery

## 2012-09-17 LAB — BASIC METABOLIC PANEL
Chloride: 102 mEq/L (ref 96–112)
Creatinine, Ser: 0.86 mg/dL (ref 0.50–1.10)
GFR calc Af Amer: 85 mL/min — ABNORMAL LOW (ref >90)

## 2012-09-17 NOTE — H&P (Addendum)
[name] is an 57 y.o. female.   Chief Complaint: Hand numbness bilaterally with abnormal electrodiagnostic studies.  HPI: Patient referred by her oncologist for management of hand numbness. She has a history of breast cancer and is on Femara. Electrodiagnostic studies completed 08/02/2012 revealed bilateral carpal tunnel syndrome and right ulnar neuropathy at the elbow. We have recommended surgical decompression of the right median and ulnar nerves.  Past Medical History  Diagnosis Date  . Hyperlipidemia   . GERD (gastroesophageal reflux disease)   . S/P radiation therapy 07/04/10 - 08/11/10    5040 cGy/28 Treatments to Left Breast  . Arthritis     knees  . Carpal tunnel syndrome of right wrist 08/2012  . Lesion of right ulnar nerve 08/2012  . History of breast cancer 04/2010    left  . Diabetes mellitus type 2, noninsulin dependent   . Hypertension     under control with med., has been on med. x 6-7 yr.  . Wears partial dentures     lower  . Wears dentures     upper    Past Surgical History  Procedure Laterality Date  . Cholecystectomy  09/16/2005  . Knee arthroscopy Left 08/11/2002  . Partial mastectomy with needle localization and axillary sentinel lymph node bx Left 05/21/2010    with re-exc. medial margin lumpectomy site  . Robotic assisted total hysterectomy with bilateral salpingo oopherectomy  09/17/2010  . Robotic assisted laparoscopic lysis of adhesion  09/17/2010    Family History  Problem Relation Age of Onset  . Heart disease Mother   . Heart disease Father   . Diabetes Father    Social History:  reports that she quit smoking about 2 years ago. She has never used smokeless tobacco. She reports that she does not drink alcohol or use illicit drugs.  Allergies:  Allergies  Allergen Reactions  . Adhesive (Tape) Other (See Comments)    BLISTERS  . Hydrocodone Nausea And Vomiting    No prescriptions prior to admission    Results for orders placed during the  hospital encounter of 09/21/12 (from the past 48 hour(s))  BASIC METABOLIC PANEL     Status: Abnormal   Collection Time    09/17/12 12:00 PM      Result Value Range   Sodium 138  135 - 145 mEq/L   Potassium 5.3 (*) 3.5 - 5.1 mEq/L   Chloride 102  96 - 112 mEq/L   CO2 25  19 - 32 mEq/L   Glucose, Bld 88  70 - 99 mg/dL   BUN 21  6 - 23 mg/dL   Creatinine, Ser 9.60  0.50 - 1.10 mg/dL   Calcium 9.6  8.4 - 45.4 mg/dL   GFR calc non Af Amer 73 (*) >90 mL/min   GFR calc Af Amer 85 (*) >90 mL/min   Comment:            The eGFR has been calculated     using the CKD EPI equation.     This calculation has not been     validated in all clinical     situations.     eGFR's persistently     <90 mL/min signify     possible Chronic Kidney Disease.   No results found.  Review of Systems  Constitutional:       History of breast cancer.  HENT: Negative for neck pain.   Eyes: Negative.   Respiratory: Negative.   Cardiovascular: Negative.  Gastrointestinal: Negative.   Genitourinary: Negative.   Musculoskeletal: Negative for joint pain.       Hand numbness bilaterally in. No evidence of stenosing tenosynovitis at this at this time.  Neurological: Negative.   Endo/Heme/Allergies: Negative.   Psychiatric/Behavioral: Negative.     Height 5' 1.5" (1.562 m), weight 82.555 kg (182 lb). Physical Exam  Constitutional: She is oriented to person, place, and time. She appears well-developed and well-nourished.  HENT:  Head: Normocephalic and atraumatic.  Eyes: Conjunctivae and EOM are normal. Pupils are equal, round, and reactive to light.  Neck: Normal range of motion. Neck supple.  Cardiovascular: Normal rate, regular rhythm and normal heart sounds.   Respiratory: Effort normal and breath sounds normal.  GI: Soft. Bowel sounds are normal.  Musculoskeletal: Normal range of motion. She exhibits no edema and no tenderness.  Diminished median and ulnar nerves sensation right hand. No thenar  atrophy.  Neurological: She is alert and oriented to person, place, and time.  Skin: Skin is warm and dry.  Psychiatric: She has a normal mood and affect. Her behavior is normal. Judgment and thought content normal.     Assessment/Plan Clinical examination and electrodiagnostic studies reveal evidence of bilateral carpal tunnel syndrome. There is right ulnar entrapment neuropathy at the elbow. We have recommended bilateral carpal tunnel release in a staged manner. I have recommended release of the right ulnar nerve with in situ decompression.  The surgery aftercare risks and potential benefits of intervention have been explained in detail. Questions were invited and answered. [name] is ready to proceed at this time.  Carmen Vallecillo JR,Shellee Streng V 09/17/2012, 2:12 PM  H&P documentation: 09/21/2012  -History and Physical Reviewed  -Patient has been re-examined  -No change in the plan of care  Wyn Forster, MD

## 2012-09-21 ENCOUNTER — Ambulatory Visit (HOSPITAL_BASED_OUTPATIENT_CLINIC_OR_DEPARTMENT_OTHER): Payer: 59 | Admitting: Certified Registered Nurse Anesthetist

## 2012-09-21 ENCOUNTER — Encounter (HOSPITAL_BASED_OUTPATIENT_CLINIC_OR_DEPARTMENT_OTHER)
Admission: RE | Disposition: A | Discharge status: Home / Self Care | Payer: Self-pay | Source: Ambulatory Visit | Attending: Orthopedic Surgery

## 2012-09-21 ENCOUNTER — Encounter (HOSPITAL_BASED_OUTPATIENT_CLINIC_OR_DEPARTMENT_OTHER): Payer: Self-pay | Admitting: Certified Registered Nurse Anesthetist

## 2012-09-21 ENCOUNTER — Encounter (HOSPITAL_BASED_OUTPATIENT_CLINIC_OR_DEPARTMENT_OTHER): Payer: Self-pay | Admitting: *Deleted

## 2012-09-21 ENCOUNTER — Ambulatory Visit (HOSPITAL_BASED_OUTPATIENT_CLINIC_OR_DEPARTMENT_OTHER)
Admission: RE | Admit: 2012-09-21 | Discharge: 2012-09-21 | Disposition: A | Discharge status: Home / Self Care | Payer: 59 | Source: Ambulatory Visit | Attending: Orthopedic Surgery | Admitting: Orthopedic Surgery

## 2012-09-21 DIAGNOSIS — Z6835 Body mass index (BMI) 35.0-35.9, adult: Secondary | ICD-10-CM | POA: Insufficient documentation

## 2012-09-21 DIAGNOSIS — Z923 Personal history of irradiation: Secondary | ICD-10-CM | POA: Insufficient documentation

## 2012-09-21 DIAGNOSIS — Z853 Personal history of malignant neoplasm of breast: Secondary | ICD-10-CM | POA: Insufficient documentation

## 2012-09-21 DIAGNOSIS — G5600 Carpal tunnel syndrome, unspecified upper limb: Secondary | ICD-10-CM | POA: Insufficient documentation

## 2012-09-21 DIAGNOSIS — K219 Gastro-esophageal reflux disease without esophagitis: Secondary | ICD-10-CM | POA: Insufficient documentation

## 2012-09-21 DIAGNOSIS — Z885 Allergy status to narcotic agent status: Secondary | ICD-10-CM | POA: Insufficient documentation

## 2012-09-21 DIAGNOSIS — Z9109 Other allergy status, other than to drugs and biological substances: Secondary | ICD-10-CM | POA: Insufficient documentation

## 2012-09-21 DIAGNOSIS — M1710 Unilateral primary osteoarthritis, unspecified knee: Secondary | ICD-10-CM | POA: Insufficient documentation

## 2012-09-21 DIAGNOSIS — I10 Essential (primary) hypertension: Secondary | ICD-10-CM | POA: Insufficient documentation

## 2012-09-21 DIAGNOSIS — E785 Hyperlipidemia, unspecified: Secondary | ICD-10-CM | POA: Insufficient documentation

## 2012-09-21 DIAGNOSIS — E119 Type 2 diabetes mellitus without complications: Secondary | ICD-10-CM | POA: Insufficient documentation

## 2012-09-21 DIAGNOSIS — G5620 Lesion of ulnar nerve, unspecified upper limb: Secondary | ICD-10-CM | POA: Insufficient documentation

## 2012-09-21 DIAGNOSIS — Z87891 Personal history of nicotine dependence: Secondary | ICD-10-CM | POA: Insufficient documentation

## 2012-09-21 HISTORY — DX: Carpal tunnel syndrome, right upper limb: G56.01

## 2012-09-21 HISTORY — DX: Unspecified osteoarthritis, unspecified site: M19.90

## 2012-09-21 HISTORY — DX: Type 2 diabetes mellitus without complications: E11.9

## 2012-09-21 HISTORY — DX: Presence of dental prosthetic device (complete) (partial): Z97.2

## 2012-09-21 HISTORY — PX: ULNAR NERVE TRANSPOSITION: SHX2595

## 2012-09-21 HISTORY — DX: Personal history of malignant neoplasm of breast: Z85.3

## 2012-09-21 HISTORY — DX: Lesion of ulnar nerve, right upper limb: G56.21

## 2012-09-21 HISTORY — PX: CARPAL TUNNEL RELEASE: SHX101

## 2012-09-21 LAB — GLUCOSE, CAPILLARY: Glucose-Capillary: 97 mg/dL (ref 70–99)

## 2012-09-21 LAB — POCT HEMOGLOBIN-HEMACUE: Hemoglobin: 11.5 g/dL — ABNORMAL LOW (ref 12.0–15.0)

## 2012-09-21 SURGERY — CARPAL TUNNEL RELEASE
Anesthesia: General | Site: Wrist | Laterality: Right | Wound class: Clean

## 2012-09-21 MED ORDER — PROPOFOL 10 MG/ML IV BOLUS
INTRAVENOUS | Status: DC | PRN
  Administered 2012-09-21: 180 mg via INTRAVENOUS

## 2012-09-21 MED ORDER — ONDANSETRON HCL 4 MG/2ML IJ SOLN
INTRAMUSCULAR | Status: DC | PRN
  Administered 2012-09-21: 4 mg via INTRAVENOUS

## 2012-09-21 MED ORDER — FENTANYL CITRATE 0.05 MG/ML IJ SOLN
INTRAMUSCULAR | Status: DC | PRN
  Administered 2012-09-21: 50 ug via INTRAVENOUS

## 2012-09-21 MED ORDER — MIDAZOLAM HCL 2 MG/ML PO SYRP: 12.0000 mg | ORAL_SOLUTION | Freq: Once | ORAL | Status: DC | PRN

## 2012-09-21 MED ORDER — HYDROMORPHONE HCL PF 1 MG/ML IJ SOLN: 0.2500 mg | INTRAMUSCULAR | Status: DC | PRN

## 2012-09-21 MED ORDER — DEXAMETHASONE SODIUM PHOSPHATE 10 MG/ML IJ SOLN
INTRAMUSCULAR | Status: DC | PRN
  Administered 2012-09-21: 4 mg via INTRAVENOUS

## 2012-09-21 MED ORDER — OXYCODONE HCL 5 MG/5ML PO SOLN: 5.0000 mg | Freq: Once | ORAL | Status: DC | PRN

## 2012-09-21 MED ORDER — LIDOCAINE HCL (CARDIAC) 20 MG/ML IV SOLN
INTRAVENOUS | Status: DC | PRN
  Administered 2012-09-21: 60 mg via INTRAVENOUS

## 2012-09-21 MED ORDER — ONDANSETRON HCL 4 MG/2ML IJ SOLN: 4.0000 mg | Freq: Once | INTRAMUSCULAR | Status: DC | PRN

## 2012-09-21 MED ORDER — OXYCODONE-ACETAMINOPHEN 5-325 MG PO TABS: ORAL_TABLET | ORAL | Status: DC

## 2012-09-21 MED ORDER — EPHEDRINE SULFATE 50 MG/ML IJ SOLN
INTRAMUSCULAR | Status: DC | PRN
  Administered 2012-09-21: 10 mg via INTRAVENOUS

## 2012-09-21 MED ORDER — FENTANYL CITRATE 0.05 MG/ML IJ SOLN: 50.0000 ug | INTRAMUSCULAR | Status: DC | PRN

## 2012-09-21 MED ORDER — LIDOCAINE HCL 2 % IJ SOLN
INTRAMUSCULAR | Status: DC | PRN
  Administered 2012-09-21: 4 mL

## 2012-09-21 MED ORDER — MIDAZOLAM HCL 5 MG/5ML IJ SOLN
INTRAMUSCULAR | Status: DC | PRN
  Administered 2012-09-21: 1 mg via INTRAVENOUS

## 2012-09-21 MED ORDER — MIDAZOLAM HCL 2 MG/2ML IJ SOLN: 1.0000 mg | INTRAMUSCULAR | Status: DC | PRN

## 2012-09-21 MED ORDER — CHLORHEXIDINE GLUCONATE 4 % EX LIQD: 60.0000 mL | Freq: Once | CUTANEOUS | Status: DC

## 2012-09-21 MED ORDER — OXYCODONE HCL 5 MG PO TABS: 5.0000 mg | ORAL_TABLET | Freq: Once | ORAL | Status: DC | PRN

## 2012-09-21 MED ORDER — LACTATED RINGERS IV SOLN
INTRAVENOUS | Status: DC
  Administered 2012-09-21 (×2): via INTRAVENOUS

## 2012-09-21 SURGICAL SUPPLY — 53 items
BANDAGE ADHESIVE 1X3 (GAUZE/BANDAGES/DRESSINGS) IMPLANT
BANDAGE ELASTIC 3 VELCRO ST LF (GAUZE/BANDAGES/DRESSINGS) ×3 IMPLANT
BANDAGE ELASTIC 4 VELCRO ST LF (GAUZE/BANDAGES/DRESSINGS) ×3 IMPLANT
BLADE MINI RND TIP GREEN BEAV (BLADE) ×3 IMPLANT
BLADE SURG 15 STRL LF DISP TIS (BLADE) ×2 IMPLANT
BLADE SURG 15 STRL SS (BLADE) ×1
BNDG ESMARK 4X9 LF (GAUZE/BANDAGES/DRESSINGS) ×3 IMPLANT
BRUSH SCRUB EZ PLAIN DRY (MISCELLANEOUS) ×3 IMPLANT
CLOTH BEACON ORANGE TIMEOUT ST (SAFETY) ×3 IMPLANT
CORDS BIPOLAR (ELECTRODE) ×3 IMPLANT
COVER MAYO STAND STRL (DRAPES) ×3 IMPLANT
COVER TABLE BACK 60X90 (DRAPES) ×3 IMPLANT
CUFF TOURNIQUET SINGLE 18IN (TOURNIQUET CUFF) ×3 IMPLANT
DECANTER SPIKE VIAL GLASS SM (MISCELLANEOUS) IMPLANT
DRAPE EXTREMITY T 121X128X90 (DRAPE) ×3 IMPLANT
DRAPE SURG 17X23 STRL (DRAPES) ×3 IMPLANT
DRSG TEGADERM 4X4.75 (GAUZE/BANDAGES/DRESSINGS) ×3 IMPLANT
GAUZE SPONGE 4X4 12PLY STRL LF (GAUZE/BANDAGES/DRESSINGS) IMPLANT
GLOVE BIO SURGEON STRL SZ 6.5 (GLOVE) ×3 IMPLANT
GLOVE BIOGEL M STRL SZ7.5 (GLOVE) ×3 IMPLANT
GLOVE BIOGEL PI IND STRL 7.0 (GLOVE) ×2 IMPLANT
GLOVE BIOGEL PI INDICATOR 7.0 (GLOVE) ×1
GLOVE EXAM NITRILE EXT CFF LRG (GLOVE) ×3 IMPLANT
GLOVE ORTHO TXT STRL SZ7.5 (GLOVE) ×3 IMPLANT
GOWN BRE IMP PREV XXLGXLNG (GOWN DISPOSABLE) IMPLANT
GOWN PREVENTION PLUS XLARGE (GOWN DISPOSABLE) IMPLANT
LOOP VESSEL MAXI BLUE (MISCELLANEOUS) IMPLANT
NDL SAFETY ECLIPSE 18X1.5 (NEEDLE) ×2 IMPLANT
NEEDLE 27GAX1X1/2 (NEEDLE) ×3 IMPLANT
NEEDLE HYPO 18GX1.5 SHARP (NEEDLE) ×1
PACK BASIN DAY SURGERY FS (CUSTOM PROCEDURE TRAY) ×3 IMPLANT
PAD CAST 3X4 CTTN HI CHSV (CAST SUPPLIES) ×2 IMPLANT
PADDING CAST ABS 4INX4YD NS (CAST SUPPLIES)
PADDING CAST ABS COTTON 4X4 ST (CAST SUPPLIES) IMPLANT
PADDING CAST COTTON 3X4 STRL (CAST SUPPLIES) ×1
SLEEVE SCD COMPRESS KNEE MED (MISCELLANEOUS) IMPLANT
SPLINT PLASTER CAST XFAST 3X15 (CAST SUPPLIES) ×10 IMPLANT
SPLINT PLASTER XTRA FASTSET 3X (CAST SUPPLIES) ×5
SPONGE GAUZE 4X4 12PLY (GAUZE/BANDAGES/DRESSINGS) IMPLANT
STOCKINETTE 4X48 STRL (DRAPES) ×3 IMPLANT
STRIP CLOSURE SKIN 1/2X4 (GAUZE/BANDAGES/DRESSINGS) ×3 IMPLANT
SUT PROLENE 3 0 PS 2 (SUTURE) ×3 IMPLANT
SUT VIC AB 3-0 SH 27 (SUTURE)
SUT VIC AB 3-0 SH 27XBRD (SUTURE) IMPLANT
SUT VIC AB 4-0 P-3 18XBRD (SUTURE) IMPLANT
SUT VIC AB 4-0 P3 18 (SUTURE)
SUT VICRYL 3-0 RB1 18 ABS (SUTURE) ×3 IMPLANT
SYR 3ML 23GX1 SAFETY (SYRINGE) IMPLANT
SYR BULB 3OZ (MISCELLANEOUS) IMPLANT
SYR CONTROL 10ML LL (SYRINGE) ×3 IMPLANT
TOWEL OR 17X24 6PK STRL BLUE (TOWEL DISPOSABLE) ×6 IMPLANT
TRAY DSU PREP LF (CUSTOM PROCEDURE TRAY) ×3 IMPLANT
UNDERPAD 30X30 INCONTINENT (UNDERPADS AND DIAPERS) ×3 IMPLANT

## 2012-09-21 NOTE — Anesthesia Procedure Notes (Signed)
Procedure Name: LMA Insertion Date/Time: 09/21/2012 7:40 AM Performed by: Velton Roselle D Pre-anesthesia Checklist: Patient identified, Emergency Drugs available, Suction available and Patient being monitored Patient Re-evaluated:Patient Re-evaluated prior to inductionOxygen Delivery Method: Circle System Utilized Preoxygenation: Pre-oxygenation with 100% oxygen Intubation Type: IV induction Ventilation: Mask ventilation without difficulty LMA: LMA inserted LMA Size: 4.0 Number of attempts: 1 Airway Equipment and Method: bite block Placement Confirmation: positive ETCO2 Tube secured with: Tape Dental Injury: Teeth and Oropharynx as per pre-operative assessment

## 2012-09-21 NOTE — Anesthesia Preprocedure Evaluation (Addendum)
Anesthesia Evaluation  Patient identified by MRN, date of birth, ID band Patient awake    Reviewed: Allergy & Precautions, NPO status   Airway Mallampati: I TM Distance: >3 FB Neck ROM: Full    Dental  (+) Dental Advisory Given, Edentulous Upper, Edentulous Lower, Lower Dentures and Upper Dentures   Pulmonary  breath sounds clear to auscultation        Cardiovascular hypertension, Pt. on home beta blockers and Pt. on medications Rhythm:Regular Rate:Normal     Neuro/Psych  Neuromuscular disease    GI/Hepatic GERD-  Medicated,  Endo/Other  diabetes, Well Controlled, Type 2, Oral Hypoglycemic AgentsMorbid obesity  Renal/GU      Musculoskeletal   Abdominal   Peds  Hematology   Anesthesia Other Findings   Reproductive/Obstetrics                          Anesthesia Physical Anesthesia Plan  ASA: III  Anesthesia Plan: General   Post-op Pain Management:    Induction:   Airway Management Planned: LMA  Additional Equipment:   Intra-op Plan:   Post-operative Plan: Extubation in OR  Informed Consent: I have reviewed the patients History and Physical, chart, labs and discussed the procedure including the risks, benefits and alternatives for the proposed anesthesia with the patient or authorized representative who has indicated his/her understanding and acceptance.   Dental advisory given  Plan Discussed with: CRNA, Anesthesiologist and Surgeon  Anesthesia Plan Comments:         Anesthesia Quick Evaluation

## 2012-09-21 NOTE — Op Note (Signed)
[id number] 

## 2012-09-21 NOTE — Transfer of Care (Signed)
Immediate Anesthesia Transfer of Care Note  Patient: [name]  Procedure(s) Performed: Procedure(s): CARPAL TUNNEL RELEASE (Right) RIGHT IN SITU DECOMPRESSION ULNAR NERVE (Right)  Patient Location: PACU  Anesthesia Type:General  Level of Consciousness: awake and patient cooperative  Airway & Oxygen Therapy: Patient Spontanous Breathing and Patient connected to face mask oxygen  Post-op Assessment: Report given to PACU RN and Post -op Vital signs reviewed and stable  Post vital signs: Reviewed and stable  Complications: No apparent anesthesia complications

## 2012-09-21 NOTE — Anesthesia Postprocedure Evaluation (Signed)
  Anesthesia Post-op Note  Patient: [name]  Procedure(s) Performed: Procedure(s): CARPAL TUNNEL RELEASE (Right) RIGHT IN SITU DECOMPRESSION ULNAR NERVE (Right)  Patient Location: PACU  Anesthesia Type:General  Level of Consciousness: awake, alert  and oriented  Airway and Oxygen Therapy: Patient Spontanous Breathing and Patient connected to face mask oxygen  Post-op Pain: none  Post-op Assessment: Post-op Vital signs reviewed  Post-op Vital Signs: Reviewed  Complications: No apparent anesthesia complications

## 2012-09-21 NOTE — Brief Op Note (Signed)
09/21/2012  8:28 AM  PATIENT:  [name]  57 y.o. female  PRE-OPERATIVE DIAGNOSIS:  RIGHT CARPAL TUNNEL SYNDROME, RIGHT ULNAR NERVE IMPINGEMENT  AT ELBOW  POST-OPERATIVE DIAGNOSIS:  RIGHT CARPAL TUNNEL SYNDROME, RIGHT ULNAR NERVE IMPINGEMENT  AT ELBOW  PROCEDURE:  Procedure(s): CARPAL TUNNEL RELEASE (Right) RIGHT IN SITU DECOMPRESSION ULNAR NERVE (Right)  SURGEON:  Surgeon(s) and Role:    * Wyn Forster., MD - Primary  PHYSICIAN ASSISTANT:   ASSISTANTS:scrub tech  ANESTHESIA:   general  EBL:  Total I/O In: 1000 [I.V.:1000] Out: -   BLOOD ADMINISTERED:none  DRAINS: none   LOCAL MEDICATIONS USED:  XYLOCAINE   SPECIMEN:  No Specimen  DISPOSITION OF SPECIMEN:  N/A  COUNTS:  YES  TOURNIQUET:   Total Tourniquet Time Documented: Upper Arm (Right) - 29 minutes Total: Upper Arm (Right) - 29 minutes   DICTATION: .Other Dictation: Dictation Number [id number]  PLAN OF CARE: Discharge to home after PACU  PATIENT DISPOSITION:  PACU - hemodynamically stable.   Delay start of Pharmacological VTE agent (>24hrs) due to surgical blood loss or risk of bleeding: not applicable

## 2012-09-22 NOTE — Op Note (Signed)
[name], [name]                ACCOUNT NO.:  [id number]  MEDICAL RECORD NO.:  [id number]  LOCATION:                                 FACILITY:  PHYSICIAN:  [name], M.D. 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  OPERATION: 1. Release of right transverse carpal ligament. 2. In situ decompression of right ulnar nerve at cubital tunnel.  OPERATING SURGEON:  [name], M.D.  ASSISTANT:  Surgical technician.  ANESTHESIA:  General by LMA.  SUPERVISING ANESTHESIOLOGIST:  [name], M.D.  INDICATIONS:  [name] is a 57 year old woman referred through the courtesy of [name] for evaluation and management of bilateral hand numbness.  She has a history of diabetes, breast [name] with biopsy, lumpectomy, radiation, and chemotherapy.  She is on estrogen denying agents.  She has had a history of bilateral hand numbness, was sent for evaluation and management.  We had a detailed discussion regarding the relationship between estrogen denying agents and possible chemotherapy and neuropathy as well as possible entrapment neuropathy related to her age and diabetes.  Electrodiagnostic studies were completed and revealed signs of entrapment neuropathy of the median nerves bilaterally and the right ulnar nerve at cubital tunnel.  After informed consent, she was brought to the operating room at this time anticipating decompression of her right carpal canal and  her right ulnar nerve at the cubital tunnel.  PROCEDURE IN DETAIL:  [name] was brought to room #2 of the [facility] and placed supine position on the operating table.  Following induction of general anesthesia by LMA technique.  The right arm and hand were prepped with Betadine soap and solution, sterilely draped.  A pneumatic tourniquet was applied high on the right brachium.  [name] was noted to have very fragile skin.  The tourniquet was well padded with stockinette.  Following application of sterile stockinette and impervious arthroscopy drapes.  The arm was exsanguinated with an Esmarch bandage and the arterial tourniquet was inflated to 220 mmHg.  Following routine surgical time-out and confirmation of proper surgical site, identification protocol, we proceeded with a short incision in line of the ring finger in the palm.  Subcutaneous tissues were carefully divided in the palmar fascia.  This split longitudinally to the common sensory branch of the median nerve.  The common sense branches were followed deep to the transverse carpal ligament and with a Penfield 4 elevator a path was created superficial to the nerve and tendons.  A pair of scissors was used to release the transverse carpal ligament along its ulnar border extending into the distal forearm.  This widely opened carpal canal.  The contents of the carpal canal were inspected. The ulnar bursa was quite edematous and fibrotic.  Bleeding  points along the margin of released ligament were electrocauterized with bipolar current followed by repair of the skin with intradermal 3-0 Prolene suture.  Attention was then directed to the medial elbow.  The elbow at 90 degrees flexion, a 3 cm incision was fashioned directly over the path of the ulnar nerve.  Subcutaneous tissues were carefully divided taking care to identify and protect the posterior branch of the medial antebrachial cutaneous nerve.  The  ulnar nerve was identified by palpation followed by release of the arcuate ligament, Osborne's band, the fascia the flexor carpi ulnaris, and the proximal brachial fascia.  The nerve was decompressed 6 cm above the epicondyle and 6 cm distal to the epicondyle.  The epineurial vessels were left undisturbed.  A Penfield 4 elevator was passed down the forearm to be sure there were no distal sites of compression.  The nerve was stable in the cubital groove.  This wound was then inspected for bleeding points followed by repair of the skin with subcutaneous 3-0 Vicryl and intradermal 3-0 Prolene segmental sutures.  Steri-Strips were applied followed by infiltration of 2% lidocaine into both wounds for postoperative analgesia.  The elbow was dressed with sterile gauze and Tegaderm.  The hand dressed with sterile gauze, sterile Webril, and a volar plaster splint with Ace wrap maintaining the wrist and 15 degrees of dorsiflexion.  For aftercare, [name] is provided prescription for Percocet 5 mg 1-2 tablets p.o. q.4-6 hours p.r.n. pain, #30 tablets without refill.  We will see her back in followup in 7-8 days in the office for dressing change and suture removal.     [name]. [name], M.D.     RVS/MEDQ  D:  09/21/2012  T:  09/22/2012  Job:  [id number]  cc:   [name], M.D.

## 2012-11-08 ENCOUNTER — Other Ambulatory Visit: Payer: Self-pay | Admitting: *Deleted

## 2012-11-08 DIAGNOSIS — C50919 Malignant neoplasm of unspecified site of unspecified female breast: Secondary | ICD-10-CM

## 2012-11-08 MED ORDER — LETROZOLE 2.5 MG PO TABS: ORAL_TABLET | ORAL | Status: DC

## 2012-11-23 ENCOUNTER — Other Ambulatory Visit: Payer: Self-pay | Admitting: Orthopedic Surgery

## 2012-11-29 ENCOUNTER — Encounter (HOSPITAL_BASED_OUTPATIENT_CLINIC_OR_DEPARTMENT_OTHER): Payer: Self-pay | Admitting: *Deleted

## 2012-11-29 ENCOUNTER — Other Ambulatory Visit: Payer: Self-pay | Admitting: Orthopedic Surgery

## 2012-11-29 NOTE — Progress Notes (Signed)
Pt here 5/14 for rt ctr-did well-added on for tomorrow-will need istat

## 2012-11-29 NOTE — H&P (Signed)
[name] is an 57 y.o. female.   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 She has had a history of numbness in her hands and feet for eight months.  She has numbness in her hands and feet at night.  To date, she has used no splints.  She has no antecedent history of injury.  She has never had a fracture.  To date, she has not used any medication such as Lyrica or Neurontin.  She does have a history of diabetes dating back more than five years.  She is on metformin. She underwent right CTR and decompression of the right ulnar nerve at the cubital tunnel on 09/21/2012. She has done very well in the post-op period and now desires surgery to the left side.  Past Medical History  Diagnosis Date  . Hyperlipidemia   . GERD (gastroesophageal reflux disease)   . S/P radiation therapy 07/04/10 - 08/11/10    5040 cGy/28 Treatments to Left Breast  . Arthritis     knees  . Carpal tunnel syndrome of right wrist 08/2012  . Lesion of right ulnar nerve 08/2012  . History of breast cancer 04/2010    left  . Diabetes mellitus type 2, noninsulin dependent   . Hypertension     under control with med., has been on med. x 6-7 yr.  . Wears partial dentures     lower  . Wears dentures     upper    Past Surgical History  Procedure Laterality Date  . Cholecystectomy  09/16/2005  . Knee arthroscopy Left 08/11/2002  . Partial mastectomy with needle localization and axillary sentinel lymph node bx Left 05/21/2010    with re-exc. medial margin lumpectomy site  . Robotic assisted total hysterectomy with bilateral salpingo oopherectomy  09/17/2010  . Robotic assisted laparoscopic lysis of adhesion  09/17/2010  . Carpal tunnel release Right 09/21/2012    Procedure: CARPAL TUNNEL RELEASE;  Surgeon: Wyn Forster., MD;  Location: [facility];  Service: Orthopedics;   Laterality: Right;  . Ulnar nerve transposition Right 09/21/2012    Procedure: RIGHT IN SITU DECOMPRESSION ULNAR NERVE;  Surgeon: Wyn Forster., MD;  Location: [facility];  Service: Orthopedics;  Laterality: Right;    Family History  Problem Relation Age of Onset  . Heart disease Mother   . Heart disease Father   . Diabetes Father    Social History:  reports that she quit smoking about 2 years ago. She has never used smokeless tobacco. She reports that she does not drink alcohol or use illicit drugs.  Allergies:  Allergies  Allergen Reactions  . Adhesive (Tape) Other (See Comments)    BLISTERS  . Hydrocodone Nausea And Vomiting    No prescriptions prior to admission    No results found for this or any previous visit (from the past 48 hour(s)).  No results found.   Pertinent items are noted in HPI.  There were no vitals taken for this visit.  General appearance: alert Head: Normocephalic, without obvious abnormality Neck: supple, symmetrical, trachea midline Resp: RRR without rales/rhonchi Cardio: regular rate and rhythm GI: normal findings: bowel sounds normal Extremities:.  Inspection of her left hand reveals normal sweat patterns and dermatoglyphics.  She does not have thenar atrophy.   She has full range of motion of her fingers in flexion/extension.  She has positive wrist flexion test at 30 seconds bilaterally, she has a negative Tinel's sign. Isolated muscle testing reveals intact thenar muscle strength.   We asked Dr. Wadie Lessen to perform screening electrodiagnostic studies.  These are remarkable for carpal tunnel syndrome  moderate on the left.  Pulses: 2+ and symmetric Skin: normal Neurologic: Grossly normal    Assessment/Plan Impression: Left CTS  Plan: To the OR for left CTR. The procedure, risks,benefits and post-op course were discussed with the patient at length and they were in agreement with the plan.   [name],[name]  J 11/29/2012, 2:16 PM   H&P documentation: 11/30/2012  -History and Physical Reviewed  -Patient has been re-examined  -No change in the plan of care  Wyn Forster, MD

## 2012-11-30 ENCOUNTER — Ambulatory Visit (HOSPITAL_BASED_OUTPATIENT_CLINIC_OR_DEPARTMENT_OTHER)
Admission: RE | Admit: 2012-11-30 | Discharge: 2012-11-30 | Disposition: A | Discharge status: Home / Self Care | Payer: 59 | Source: Ambulatory Visit | Attending: Orthopedic Surgery | Admitting: Orthopedic Surgery

## 2012-11-30 ENCOUNTER — Ambulatory Visit (HOSPITAL_BASED_OUTPATIENT_CLINIC_OR_DEPARTMENT_OTHER): Payer: 59 | Admitting: *Deleted

## 2012-11-30 ENCOUNTER — Encounter (HOSPITAL_BASED_OUTPATIENT_CLINIC_OR_DEPARTMENT_OTHER): Payer: Self-pay | Admitting: *Deleted

## 2012-11-30 ENCOUNTER — Encounter (HOSPITAL_BASED_OUTPATIENT_CLINIC_OR_DEPARTMENT_OTHER)
Admission: RE | Disposition: A | Discharge status: Home / Self Care | Payer: Self-pay | Source: Ambulatory Visit | Attending: Orthopedic Surgery

## 2012-11-30 DIAGNOSIS — K219 Gastro-esophageal reflux disease without esophagitis: Secondary | ICD-10-CM | POA: Insufficient documentation

## 2012-11-30 DIAGNOSIS — Z853 Personal history of malignant neoplasm of breast: Secondary | ICD-10-CM | POA: Insufficient documentation

## 2012-11-30 DIAGNOSIS — Z79899 Other long term (current) drug therapy: Secondary | ICD-10-CM | POA: Insufficient documentation

## 2012-11-30 DIAGNOSIS — Z923 Personal history of irradiation: Secondary | ICD-10-CM | POA: Insufficient documentation

## 2012-11-30 DIAGNOSIS — G5600 Carpal tunnel syndrome, unspecified upper limb: Secondary | ICD-10-CM | POA: Insufficient documentation

## 2012-11-30 DIAGNOSIS — M1710 Unilateral primary osteoarthritis, unspecified knee: Secondary | ICD-10-CM | POA: Insufficient documentation

## 2012-11-30 DIAGNOSIS — Z9221 Personal history of antineoplastic chemotherapy: Secondary | ICD-10-CM | POA: Insufficient documentation

## 2012-11-30 DIAGNOSIS — E119 Type 2 diabetes mellitus without complications: Secondary | ICD-10-CM | POA: Insufficient documentation

## 2012-11-30 DIAGNOSIS — Z87891 Personal history of nicotine dependence: Secondary | ICD-10-CM | POA: Insufficient documentation

## 2012-11-30 DIAGNOSIS — G5620 Lesion of ulnar nerve, unspecified upper limb: Secondary | ICD-10-CM | POA: Insufficient documentation

## 2012-11-30 DIAGNOSIS — E785 Hyperlipidemia, unspecified: Secondary | ICD-10-CM | POA: Insufficient documentation

## 2012-11-30 DIAGNOSIS — I10 Essential (primary) hypertension: Secondary | ICD-10-CM | POA: Insufficient documentation

## 2012-11-30 HISTORY — PX: CARPAL TUNNEL RELEASE: SHX101

## 2012-11-30 LAB — POCT I-STAT, CHEM 8
Creatinine, Ser: 0.9 mg/dL (ref 0.50–1.10)
Glucose, Bld: 101 mg/dL — ABNORMAL HIGH (ref 70–99)
Hemoglobin: 13.3 g/dL (ref 12.0–15.0)
TCO2: 30 mmol/L (ref 0–100)

## 2012-11-30 SURGERY — CARPAL TUNNEL RELEASE
Anesthesia: General | Site: Hand | Laterality: Left | Wound class: Clean

## 2012-11-30 MED ORDER — CHLORHEXIDINE GLUCONATE 4 % EX LIQD: 60.0000 mL | Freq: Once | CUTANEOUS | Status: DC

## 2012-11-30 MED ORDER — FENTANYL CITRATE 0.05 MG/ML IJ SOLN
INTRAMUSCULAR | Status: DC | PRN
  Administered 2012-11-30 (×2): 50 ug via INTRAVENOUS

## 2012-11-30 MED ORDER — MIDAZOLAM HCL 5 MG/5ML IJ SOLN
INTRAMUSCULAR | Status: DC | PRN
  Administered 2012-11-30: 1 mg via INTRAVENOUS

## 2012-11-30 MED ORDER — OXYCODONE-ACETAMINOPHEN 5-325 MG PO TABS: ORAL_TABLET | ORAL | Status: DC

## 2012-11-30 MED ORDER — DEXAMETHASONE SODIUM PHOSPHATE 10 MG/ML IJ SOLN
INTRAMUSCULAR | Status: DC | PRN
  Administered 2012-11-30: 4 mg via INTRAVENOUS

## 2012-11-30 MED ORDER — LIDOCAINE HCL 2 % IJ SOLN
INTRAMUSCULAR | Status: DC | PRN
  Administered 2012-11-30: 5 mL

## 2012-11-30 MED ORDER — LACTATED RINGERS IV SOLN
INTRAVENOUS | Status: DC
  Administered 2012-11-30: 09:00:00 via INTRAVENOUS

## 2012-11-30 MED ORDER — PROPOFOL 10 MG/ML IV BOLUS
INTRAVENOUS | Status: DC | PRN
  Administered 2012-11-30: 140 mg via INTRAVENOUS

## 2012-11-30 MED ORDER — MIDAZOLAM HCL 2 MG/2ML IJ SOLN: 1.0000 mg | INTRAMUSCULAR | Status: DC | PRN

## 2012-11-30 MED ORDER — ONDANSETRON HCL 4 MG/2ML IJ SOLN
INTRAMUSCULAR | Status: DC | PRN
  Administered 2012-11-30: 4 mg via INTRAVENOUS

## 2012-11-30 MED ORDER — FENTANYL CITRATE 0.05 MG/ML IJ SOLN: 50.0000 ug | INTRAMUSCULAR | Status: DC | PRN

## 2012-11-30 MED ORDER — LIDOCAINE HCL (CARDIAC) 20 MG/ML IV SOLN
INTRAVENOUS | Status: DC | PRN
  Administered 2012-11-30: 40 mg via INTRAVENOUS

## 2012-11-30 MED ORDER — HYDROMORPHONE HCL PF 1 MG/ML IJ SOLN: 0.2500 mg | INTRAMUSCULAR | Status: DC | PRN

## 2012-11-30 SURGICAL SUPPLY — 40 items
BANDAGE ADHESIVE 1X3 (GAUZE/BANDAGES/DRESSINGS) IMPLANT
BANDAGE ELASTIC 3 VELCRO ST LF (GAUZE/BANDAGES/DRESSINGS) ×2 IMPLANT
BLADE SURG 15 STRL LF DISP TIS (BLADE) ×1 IMPLANT
BLADE SURG 15 STRL SS (BLADE) ×1
BNDG ESMARK 4X9 LF (GAUZE/BANDAGES/DRESSINGS) ×2 IMPLANT
BRUSH SCRUB EZ PLAIN DRY (MISCELLANEOUS) ×2 IMPLANT
CLOTH BEACON ORANGE TIMEOUT ST (SAFETY) ×2 IMPLANT
CORDS BIPOLAR (ELECTRODE) ×2 IMPLANT
COVER MAYO STAND STRL (DRAPES) ×2 IMPLANT
COVER TABLE BACK 60X90 (DRAPES) ×2 IMPLANT
CUFF TOURNIQUET SINGLE 18IN (TOURNIQUET CUFF) IMPLANT
CUFF TOURNIQUET SINGLE 24IN (TOURNIQUET CUFF) ×2 IMPLANT
DECANTER SPIKE VIAL GLASS SM (MISCELLANEOUS) ×2 IMPLANT
DRAPE EXTREMITY T 121X128X90 (DRAPE) ×2 IMPLANT
DRAPE SURG 17X23 STRL (DRAPES) ×2 IMPLANT
GLOVE BIOGEL M STRL SZ7.5 (GLOVE) ×2 IMPLANT
GLOVE BIOGEL PI IND STRL 7.0 (GLOVE) ×1 IMPLANT
GLOVE BIOGEL PI INDICATOR 7.0 (GLOVE) ×1
GLOVE ECLIPSE 6.5 STRL STRAW (GLOVE) ×2 IMPLANT
GLOVE EXAM NITRILE MD LF STRL (GLOVE) ×2 IMPLANT
GLOVE ORTHO TXT STRL SZ7.5 (GLOVE) ×2 IMPLANT
GOWN BRE IMP PREV XXLGXLNG (GOWN DISPOSABLE) ×4 IMPLANT
GOWN PREVENTION PLUS XLARGE (GOWN DISPOSABLE) ×4 IMPLANT
NEEDLE 27GAX1X1/2 (NEEDLE) ×2 IMPLANT
PACK BASIN DAY SURGERY FS (CUSTOM PROCEDURE TRAY) ×2 IMPLANT
PAD CAST 3X4 CTTN HI CHSV (CAST SUPPLIES) ×1 IMPLANT
PADDING CAST ABS 4INX4YD NS (CAST SUPPLIES) ×1
PADDING CAST ABS COTTON 4X4 ST (CAST SUPPLIES) ×1 IMPLANT
PADDING CAST COTTON 3X4 STRL (CAST SUPPLIES) ×1
SPLINT PLASTER CAST XFAST 3X15 (CAST SUPPLIES) ×5 IMPLANT
SPLINT PLASTER XTRA FASTSET 3X (CAST SUPPLIES) ×5
SPONGE GAUZE 4X4 12PLY (GAUZE/BANDAGES/DRESSINGS) ×2 IMPLANT
STOCKINETTE 4X48 STRL (DRAPES) ×2 IMPLANT
STRIP CLOSURE SKIN 1/2X4 (GAUZE/BANDAGES/DRESSINGS) ×2 IMPLANT
SUT PROLENE 3 0 PS 2 (SUTURE) ×2 IMPLANT
SYR 3ML 23GX1 SAFETY (SYRINGE) IMPLANT
SYR CONTROL 10ML LL (SYRINGE) ×2 IMPLANT
TOWEL OR 17X24 6PK STRL BLUE (TOWEL DISPOSABLE) ×2 IMPLANT
TRAY DSU PREP LF (CUSTOM PROCEDURE TRAY) ×2 IMPLANT
UNDERPAD 30X30 INCONTINENT (UNDERPADS AND DIAPERS) ×2 IMPLANT

## 2012-11-30 NOTE — Anesthesia Preprocedure Evaluation (Signed)
Anesthesia Evaluation  Patient identified by MRN, date of birth, ID band Patient awake    Reviewed: Allergy & Precautions, H&P , NPO status , Patient's Chart, lab work & pertinent test results, reviewed documented beta blocker date and time   Airway Mallampati: II TM Distance: >3 FB Neck ROM: Full    Dental no notable dental hx. (+) Teeth Intact and Dental Advisory Given   Pulmonary neg pulmonary ROS,  breath sounds clear to auscultation  Pulmonary exam normal       Cardiovascular hypertension, On Medications and On Home Beta Blockers Rhythm:Regular Rate:Normal     Neuro/Psych  Neuromuscular disease negative psych ROS   GI/Hepatic Neg liver ROS, GERD-  Medicated and Controlled,  Endo/Other  diabetes, Type 2, Oral Hypoglycemic AgentsMorbid obesity  Renal/GU negative Renal ROS  negative genitourinary   Musculoskeletal   Abdominal   Peds  Hematology negative hematology ROS (+)   Anesthesia Other Findings   Reproductive/Obstetrics negative OB ROS                           Anesthesia Physical Anesthesia Plan  ASA: III  Anesthesia Plan: General   Post-op Pain Management:    Induction: Intravenous  Airway Management Planned: LMA  Additional Equipment:   Intra-op Plan:   Post-operative Plan: Extubation in OR  Informed Consent: I have reviewed the patients History and Physical, chart, labs and discussed the procedure including the risks, benefits and alternatives for the proposed anesthesia with the patient or authorized representative who has indicated his/her understanding and acceptance.   Dental advisory given  Plan Discussed with: CRNA and Surgeon  Anesthesia Plan Comments:         Anesthesia Quick Evaluation

## 2012-11-30 NOTE — Anesthesia Postprocedure Evaluation (Signed)
  Anesthesia Post-op Note  Patient: [name]  Procedure(s) Performed: Procedure(s): CARPAL TUNNEL RELEASE (Left)  Patient Location: PACU  Anesthesia Type:General  Level of Consciousness: awake, alert  and oriented  Airway and Oxygen Therapy: Patient Spontanous Breathing  Post-op Pain: none  Post-op Assessment: Post-op Vital signs reviewed, Patient's Cardiovascular Status Stable, Respiratory Function Stable, Patent Airway and No signs of Nausea or vomiting  Post-op Vital Signs: Reviewed and stable  Complications: No apparent anesthesia complications

## 2012-11-30 NOTE — Op Note (Signed)
[id number]  

## 2012-11-30 NOTE — Transfer of Care (Signed)
Immediate Anesthesia Transfer of Care Note  Patient: [name]  Procedure(s) Performed: Procedure(s): CARPAL TUNNEL RELEASE (Left)  Patient Location: PACU  Anesthesia Type:General  Level of Consciousness: awake and alert   Airway & Oxygen Therapy: Patient Spontanous Breathing and Patient connected to face mask oxygen  Post-op Assessment: Report given to PACU RN and Post -op Vital signs reviewed and stable  Post vital signs: Reviewed and stable  Complications: No apparent anesthesia complications

## 2012-11-30 NOTE — Brief Op Note (Signed)
11/30/2012  9:34 AM  PATIENT:  [name]  57 y.o. female  PRE-OPERATIVE DIAGNOSIS:  LEFT CARPAL TUNNEL SYNDROME  POST-OPERATIVE DIAGNOSIS:  LEFT CARPAL TUNNEL SYNDROME  PROCEDURE:  Procedure(s): CARPAL TUNNEL RELEASE (Left)  SURGEON:  Surgeon(s) and Role:    * Wyn Forster., MD - Primary  PHYSICIAN ASSISTANT:   ASSISTANTS: Mallory Shirk.A-C   ANESTHESIA:   general  EBL:     BLOOD ADMINISTERED:none  DRAINS: none   LOCAL MEDICATIONS USED:  LIDOCAINE   SPECIMEN:  No Specimen  DISPOSITION OF SPECIMEN:  N/A  COUNTS:  YES  TOURNIQUET:  * Missing tourniquet times found for documented tourniquets in log:  [id number] *  DICTATION: .Other Dictation: Dictation Number [id number]  PLAN OF CARE: Discharge to home after PACU  PATIENT DISPOSITION:  PACU - hemodynamically stable.   Delay start of Pharmacological VTE agent (>24hrs) due to surgical blood loss or risk of bleeding: not applicable

## 2012-11-30 NOTE — Anesthesia Procedure Notes (Signed)
Procedure Name: LMA Insertion Date/Time: 11/30/2012 9:15 AM Performed by: Meyer Russel Pre-anesthesia Checklist: Patient identified, Emergency Drugs available, Suction available and Patient being monitored Patient Re-evaluated:Patient Re-evaluated prior to inductionOxygen Delivery Method: Circle System Utilized Preoxygenation: Pre-oxygenation with 100% oxygen Intubation Type: IV induction Ventilation: Mask ventilation without difficulty LMA: LMA inserted LMA Size: 4.0 Number of attempts: 1 Airway Equipment and Method: bite block Placement Confirmation: positive ETCO2 and breath sounds checked- equal and bilateral Tube secured with: Tape Dental Injury: Teeth and Oropharynx as per pre-operative assessment

## 2012-12-01 ENCOUNTER — Encounter (HOSPITAL_BASED_OUTPATIENT_CLINIC_OR_DEPARTMENT_OTHER): Payer: Self-pay | Admitting: Orthopedic Surgery

## 2012-12-01 NOTE — Op Note (Signed)
NAMECANDYCE, [name]                ACCOUNT NO.:  [id number]  MEDICAL RECORD NO.:  [id number]  LOCATION:                               FACILITY:  [facility]  PHYSICIAN:  Katy Fitch. Keshonda Monsour, M.D. 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  OPERATIONS:  Release of left transverse carpal ligament.  OPERATING SURGEON:  Katy Fitch. Dalicia Kisner, MD.  ASSISTANT:  Marveen Reeks [name], PA-C  ANESTHESIA:  General by LMA.  SUPERVISING ANESTHESIOLOGIST:  Dr. Autumn Patty.  INDICATIONS:  [name] is a 57 year old woman referred through the courtesy of Dr. Darnelle Catalan for evaluation and management of bilateral hand numbness.  Clinical examination revealed signs of ulnar and median neuropathy on the right and median neuropathy on the left.  Electrodiagnostic studies confirmed entrapped neuropathy, superimposed on chemotherapy-induced neuropathy of the ulnar nerve across the right elbow and across the carpal tunnels bilaterally.  [name] is status post decompression of the median and ulnar nerves on the right with a very satisfactory clinical result of improve sensibility.  She now returns for release of her left transverse carpal ligament on the left.  Preoperatively questions were invited and answered in detail.  She reported that she did tolerate Percocet quite well as a perioperative analgesic.  PROCEDURE IN DETAIL:  [name] was interviewed by Dr. Sampson Goon of Anesthesia in the holding area and after detailed anesthesia informed consent, selected general anesthesia by LMA technique.  Her proper surgical site was identified per protocol the marking pen. She was transferred to  room 2 of the [facility].  She was placed in supine position on the operating table.  Under Dr. Jarrett Ables direct supervision, general anesthesia by LMA technique was induced followed by routine Betadine scrub and paint of the left upper extremity.  Following a routine surgical time-out, the procedure commenced with exsanguination of left arm with Esmarch bandage, inflation of arterial tourniquet to 220 mmHg.  Procedure commenced with a short incision in the line of the ring finger in the palm.  Subcutaneous tissues were carefully divided along the palmar fascia.  This was split longitudinally to the common sensory branch of median nerve.  These were followed back to the median nerve proper, which was gently isolated from the transverse carpal ligament with the aid of a Penfield 4 Engineer, structural.  The ligaments were sequentially released with scissors extending into the distal forearm.  The volar forearm fascia was released subcutaneously.  This widely opened carpal canal.  No mass or other predicaments were noted.  Bleeding points along the margin of the released ligament were electrocauterized with bipolar current followed by repair of the skin with intradermal 3-0 Prolene suture.  A compressive dressing was applied with a volar plaster splint maintaining the wrist in 15 degrees of dorsiflexion.  For aftercare, [name] was provided prescription for Percocet 5 mg 1 p.o. q.4-6 hours p.r.n. pain, 20 tabs refill.     Katy Fitch  Zael Shuman, M.D.     RVS/MEDQ  D:  11/30/2012  T:  11/30/2012  Job:  [id number]

## 2012-12-07 LAB — POCT I-STAT, CHEM 8
Creatinine, Ser: 0.9 mg/dL (ref 0.50–1.10)
Glucose, Bld: 101 mg/dL — ABNORMAL HIGH (ref 70–99)
Hemoglobin: 13.6 g/dL (ref 12.0–15.0)
TCO2: 27 mmol/L (ref 0–100)

## 2012-12-30 ENCOUNTER — Encounter: Payer: Self-pay | Admitting: Oncology

## 2013-01-17 ENCOUNTER — Other Ambulatory Visit (HOSPITAL_BASED_OUTPATIENT_CLINIC_OR_DEPARTMENT_OTHER): Payer: 59

## 2013-01-17 ENCOUNTER — Other Ambulatory Visit: Payer: Self-pay | Admitting: *Deleted

## 2013-01-17 DIAGNOSIS — C50912 Malignant neoplasm of unspecified site of left female breast: Secondary | ICD-10-CM

## 2013-01-17 DIAGNOSIS — C50919 Malignant neoplasm of unspecified site of unspecified female breast: Secondary | ICD-10-CM

## 2013-01-17 LAB — CBC WITH DIFFERENTIAL/PLATELET
Eosinophils Absolute: 0.3 10*3/uL (ref 0.0–0.5)
HCT: 36.5 % (ref 34.8–46.6)
LYMPH%: 22.5 % (ref 14.0–49.7)
MCHC: 33.1 g/dL (ref 31.5–36.0)
MCV: 93.4 fL (ref 79.5–101.0)
MONO%: 5.9 % (ref 0.0–14.0)
NEUT#: 5.1 10*3/uL (ref 1.5–6.5)
NEUT%: 66.3 % (ref 38.4–76.8)
Platelets: 206 10*3/uL (ref 145–400)
RBC: 3.9 10*6/uL (ref 3.70–5.45)

## 2013-01-17 LAB — COMPREHENSIVE METABOLIC PANEL (CC13)
Alkaline Phosphatase: 136 U/L (ref 40–150)
Creatinine: 0.9 mg/dL (ref 0.6–1.1)
Glucose: 88 mg/dl (ref 70–140)
Sodium: 143 mEq/L (ref 136–145)
Total Bilirubin: 0.88 mg/dL (ref 0.20–1.20)
Total Protein: 6.8 g/dL (ref 6.4–8.3)

## 2013-01-24 ENCOUNTER — Ambulatory Visit (HOSPITAL_BASED_OUTPATIENT_CLINIC_OR_DEPARTMENT_OTHER): Payer: 59 | Admitting: Oncology

## 2013-01-24 VITALS — BP 154/86 | HR 103 | Temp 98.3°F | Resp 20 | Ht 62.0 in | Wt 198.0 lb

## 2013-01-24 DIAGNOSIS — G4700 Insomnia, unspecified: Secondary | ICD-10-CM

## 2013-01-24 DIAGNOSIS — Z170 Estrogen receptor positive status [ER+]: Secondary | ICD-10-CM

## 2013-01-24 DIAGNOSIS — C50412 Malignant neoplasm of upper-outer quadrant of left female breast: Secondary | ICD-10-CM

## 2013-01-24 DIAGNOSIS — C50919 Malignant neoplasm of unspecified site of unspecified female breast: Secondary | ICD-10-CM

## 2013-01-24 MED ORDER — LETROZOLE 2.5 MG PO TABS: ORAL_TABLET | ORAL | Status: DC

## 2013-01-24 NOTE — Progress Notes (Signed)
[name]  DOB: 01/30/1956  MR#: [id number]  CSN#: [id number]    History of present illness:   [name] is a 57 year old [place] woman referred by Dr. Derrell Lolling for evaluation and treatment of newly diagnosed breast cancer.  Khalani felt fine when she underwent routine mammographic screening June 12, 2009 at [place].  Dr. Tilda Burrow noted a potential abnormality on the left breast so on the April 17, 2010 the patient was recalled for diagnostic left mammography and ultrasonography.  He found a persistent 7 mm mass with irregular margins, which by ultrasound measured 7 mm and was irregular and hypoechoic.  A 4 mm benign appearing cyst was incidentally noted anterior to this lesion.  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  With this information the patient was referred to Dr. Derrell Lolling and bilateral breast MRIs were obtained May 01, 2009.  This showed a 1.3 cm enhancing mass in the upper central portion of the left breast associated with the marker clip from the prior biopsy.  There were no other areas of concern in either breast and there were no suspicious internal mammary or axillary lymph nodes.  Accordingly on May 21, 2010 the patient underwent left lumpectomy and axillary lymph node sampling.  The results of this procedure ([id number]) confirmed an invasive ductal carcinoma, grade 2 with clear and ample margins and no evidence of lymphovascular invasion.  Both sentinel lymph nodes were clear.  HER-2/neu was repeated and was again negative.  Interval history: [name] returns today for followup of her breast cancer. Since her last visit here she met with Dr. Teressa Senter, had bilateral carpal tunnel surgery, and  removal of some elbow spurs, with significant symptomatic improvement. Otherwise she and her husband walk [address] every morning. 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  Review of systems:   The various opportunity pains that she was having previously pretty much resolved. She is having a little bit more discomfort now in the upper right arm and she already has an appointment with Dr. Dora Sims for regarding that. Otherwise, she feels a little bit forgetful. She has problems with insomnia. Her hot flashes are improved with the gabapentin. She is tolerating the letrozole with no other side effects that she is aware of at this point. A detailed review of systems was otherwise noncontributory  Past medical history:      Past Medical History  Diagnosis Date  . Hyperlipidemia   . GERD (gastroesophageal reflux disease)   . S/P radiation therapy 07/04/10 - 08/11/10    5040 cGy/28 Treatments to Left Breast  . Arthritis     knees  . Carpal tunnel syndrome of right wrist 08/2012  . Lesion of right ulnar nerve 08/2012  . History of breast cancer 04/2010    left  . Diabetes mellitus type 2, noninsulin dependent   . Hypertension     under control with med., has been on med. x 6-7 yr.  . Wears partial dentures     lower  . Wears dentures     upper  60 P/Y tobacco abuse, discontinued JAN 2012 Diabetes mellitus Remote migraines osteoarthritis  Past surgical history:      Past Surgical History  Procedure Laterality Date  .  Cholecystectomy  09/16/2005  . Knee arthroscopy Left 08/11/2002  . Partial mastectomy with [name] localization and axillary sentinel lymph node bx Left 05/21/2010    with re-exc. medial margin lumpectomy site  . Robotic assisted total hysterectomy with bilateral salpingo oopherectomy  09/17/2010  . Robotic assisted laparoscopic lysis of adhesion  09/17/2010  . Carpal tunnel release Right 09/21/2012    Procedure: CARPAL TUNNEL RELEASE;  Surgeon: Wyn Forster., MD;   Location: [facility];  Service: Orthopedics;  Laterality: Right;  . Ulnar nerve transposition Right 09/21/2012    Procedure: RIGHT IN SITU DECOMPRESSION ULNAR NERVE;  Surgeon: Wyn Forster., MD;  Location: [facility];  Service: Orthopedics;  Laterality: Right;  . Carpal tunnel release Left 11/30/2012    Procedure: CARPAL TUNNEL RELEASE;  Surgeon: Wyn Forster., MD;  Location: [facility];  Service: Orthopedics;  Laterality: Left;    Family history:     Family History  Problem Relation Age of Onset  . Heart disease Mother   . Heart disease Father   . Diabetes Father   The patient's father died at the age of 50 from heart problems.  The patient's mother died at the age of 29 with heart problems.  There is one aunt and one uncle on each side with history of lung cancer.  There is no history of breast or ovarian cancer in the family to her knowledge.  Gynecologic history: She is GX, P0, menarche age 37.  Last menstrual period about 10 years ago.  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  Her husband of 64 years [name] is a "househusband."  They have about four acres and keep chickens, goats, and other animals.  The patient is not a church attender.     ADVANCED DIRECTIVES:Not in place  Health maintenance:       History  Substance Use Topics  . Smoking status: Former Smoker    Quit date: 05/07/2010  . Smokeless tobacco: Never Used  . Alcohol Use: No      Colonoscopy:  Due in 2014  PAP: Due now  Bone density: repeat due  January 2015;   Cholesterol: Followed by PCP    Allergies:     Allergies  Allergen Reactions  . Adhesive [Tape] Other (See Comments)    BLISTERS  . Hydrocodone Nausea And Vomiting    Medications:      Current Outpatient Prescriptions  Medication Sig Dispense Refill  .  atenolol (TENORMIN) 100 MG tablet Take 100 mg by mouth daily.       Marland Kitchen buPROPion (WELLBUTRIN SR) 150 MG 12 hr tablet Take 150 mg by mouth 2 (two) times daily.       . calcium carbonate (OS-CAL) 600 MG TABS Take 600 mg by mouth 2 (two) times daily with a meal.       . gabapentin (NEURONTIN) 300 MG capsule MAY TAKE 2 CAPSULES BY MOUTH AT BEDTIME  60 capsule  3  . letrozole (FEMARA) 2.5 MG tablet TAKE ONE (1) TABLET EACH DAY  90 tablet  0  . losartan (COZAAR) 100 MG tablet Take 50 mg by mouth Daily.       Marland Kitchen lovastatin (MEVACOR) 10 MG tablet Take 10 mg by mouth at bedtime.      . metFORMIN (GLUCOPHAGE) 1000 MG  tablet Take by mouth 2 (two) times daily with a meal. 1000mg  po QAM and 500 mg PO QHS      . naproxen sodium (ANAPROX) 220 MG tablet Take 220 mg by mouth 2 (two) times daily with a meal.      . ONE TOUCH ULTRA TEST test strip Daily.      Marland Kitchen oxyCODONE-acetaminophen (PERCOCET/ROXICET) 5-325 MG per tablet Take one or 2 tablets every 4-6 hours as needed for surgical pain.  30 tablet  0  . oxyCODONE-acetaminophen (PERCOCET/ROXICET) 5-325 MG per tablet 1 or 2 tabs every 4 hours as needed for pain  20 tablet  0  . ranitidine (ZANTAC) 300 MG tablet Take 300 mg by mouth at bedtime.        No current facility-administered medications for this visit.    Physical exam:     Middle-aged white female  who appears stated age  37 Vitals:   01/24/13 0908  BP: 154/86  Pulse: 103  Temp: 98.3 F (36.8 C)  Resp: 20     Body mass index is 36.21 kg/(m^2).  ECOG PS: 0  Filed Weights   01/24/13 0908  Weight: 198 lb (89.812 kg)   Sclerae unicteric, pupils equal round and reactive  Oropharynx clear No cervical or supraclavicular adenopathy Lungs no rales or rhonchi Heart regular rate and rhythm Abd soft, obese, nontender, positive bowel sounds MSK no focal spinal tenderness, no peripheral edema; Neuro: nonfocal, well oriented, pleasant affect Breasts: The right breast is unremarkable. The left breast  is status post lumpectomy and radiation. There is no evidence of local recurrence.  The left axilla is benign  Lab results:    CBC    Component Value Date/Time   WBC 7.6 01/17/2013 1113   WBC 16.9* 09/18/2010 0640   RBC 3.90 01/17/2013 1113   RBC 3.94 09/18/2010 0640   HGB 12.1 01/17/2013 1113   HGB 13.3 11/30/2012 0900   HCT 36.5 01/17/2013 1113   HCT 39.0 11/30/2012 0900   PLT 206 01/17/2013 1113   PLT 216 09/18/2010 0640   MCV 93.4 01/17/2013 1113   MCV 97.5 09/18/2010 0640   MCH 30.9 01/17/2013 1113   MCH 31.0 09/18/2010 0640   MCHC 33.1 01/17/2013 1113   MCHC 31.8 09/18/2010 0640   RDW 13.3 01/17/2013 1113   RDW 13.8 09/18/2010 0640   LYMPHSABS 1.7 01/17/2013 1113   LYMPHSABS 3.6 05/17/2010 1139   MONOABS 0.5 01/17/2013 1113   MONOABS 0.7 05/17/2010 1139   EOSABS 0.3 01/17/2013 1113   EOSABS 0.3 05/17/2010 1139   BASOSABS 0.1 01/17/2013 1113   BASOSABS 0.1 05/17/2010 1139   BMET    Component Value Date/Time   NA 143 01/17/2013 1113   NA 142 11/30/2012 0900   K 4.7 01/17/2013 1113   K 4.5 11/30/2012 0900   CL 104 11/30/2012 0900   CL 104 06/01/2012 0850   CO2 25 01/17/2013 1113   CO2 25 09/17/2012 1200   GLUCOSE 88 01/17/2013 1113   GLUCOSE 101* 11/30/2012 0900   GLUCOSE 84 06/01/2012 0850   BUN 19.1 01/17/2013 1113   BUN 27* 11/30/2012 0900   CREATININE 0.9 01/17/2013 1113   CREATININE 0.90 11/30/2012 0900   CALCIUM 9.8 01/17/2013 1113   CALCIUM 9.6 09/17/2012 1200   GFRNONAA 73* 09/17/2012 1200   GFRAA 85* 09/17/2012 1200     Studies:    Mammography at [facility] 2012-11-09 showed only post surgical scarring. She will resume yearly bilateral mammography in January of 2015  Bone density at [facility] 04/29/2012 was normal.  Assessment: 57 y.o.  [place] woman   (1) status post left lumpectomy and sentinel lymph node sampling in January 2012 for a T1C N0, Stage IA  invasive ductal carcinoma, grade 2, strongly estrogen and progesterone receptor positive, HER2/neu negative, with a borderline/low MIB-1.   (2)  Oncotype DX score in the low range, predicting an 8% risk of recurrence within 10 years if she took tamoxifen for 5 years.   (3) Status post radiation, completed April 2012.   (4) On letrozole 2.5 mg daily since May 2012 with good tolerance.    (5) Status post hysterectomy/bilateral salpingo-oophorectomy in May 2012   Plan:  Christee is doing terrific as far as her breast cancer is concerned she is going to have her next mammogram in January and I have added a bone density at the same time. She will see me in February. If the bone density remains in the normal range, the plan will be to continue letrozole for a total of 5 years. Otherwise we will consider switching to tamoxifen. Aanyah is a good understanding of the overall plan. She knows to call for any problems that may develop before her next visit here.  Ikran Patman C 01/24/2013

## 2013-01-26 ENCOUNTER — Telehealth: Payer: Self-pay | Admitting: *Deleted

## 2013-01-26 NOTE — Telephone Encounter (Signed)
Lm gave appt for [facility] on 05/11/13@ 9am. i also gv appts for 06/13/12 w/ labs@ 9am and ov@ 9:30am. Made pt aware that I will mail a letter/avs...td

## 2013-01-28 ENCOUNTER — Other Ambulatory Visit: Payer: Self-pay | Admitting: *Deleted

## 2013-02-04 ENCOUNTER — Other Ambulatory Visit: Payer: Self-pay | Admitting: Orthopedic Surgery

## 2013-02-04 DIAGNOSIS — M25511 Pain in right shoulder: Secondary | ICD-10-CM

## 2013-02-11 ENCOUNTER — Ambulatory Visit
Admission: RE | Admit: 2013-02-11 | Discharge: 2013-02-11 | Disposition: A | Discharge status: Home / Self Care | Payer: 59 | Source: Ambulatory Visit | Attending: Orthopedic Surgery | Admitting: Orthopedic Surgery

## 2013-02-11 DIAGNOSIS — M25511 Pain in right shoulder: Secondary | ICD-10-CM

## 2013-03-21 ENCOUNTER — Encounter (HOSPITAL_BASED_OUTPATIENT_CLINIC_OR_DEPARTMENT_OTHER): Payer: Self-pay | Admitting: *Deleted

## 2013-03-21 NOTE — Progress Notes (Signed)
Pt has been here several times-to come in for bmet-bring all meds and overnight bag

## 2013-03-22 ENCOUNTER — Encounter (HOSPITAL_BASED_OUTPATIENT_CLINIC_OR_DEPARTMENT_OTHER)
Admission: RE | Admit: 2013-03-22 | Discharge: 2013-03-22 | Disposition: A | Discharge status: Home / Self Care | Payer: 59 | Source: Ambulatory Visit | Attending: Orthopedic Surgery | Admitting: Orthopedic Surgery

## 2013-03-22 ENCOUNTER — Other Ambulatory Visit: Payer: Self-pay | Admitting: Orthopedic Surgery

## 2013-03-22 DIAGNOSIS — Z01812 Encounter for preprocedural laboratory examination: Secondary | ICD-10-CM | POA: Insufficient documentation

## 2013-03-22 DIAGNOSIS — Z01818 Encounter for other preprocedural examination: Secondary | ICD-10-CM | POA: Insufficient documentation

## 2013-03-22 LAB — BASIC METABOLIC PANEL
BUN: 20 mg/dL (ref 6–23)
Calcium: 9.6 mg/dL (ref 8.4–10.5)
Creatinine, Ser: 0.83 mg/dL (ref 0.50–1.10)
GFR calc non Af Amer: 76 mL/min — ABNORMAL LOW (ref >90)
Glucose, Bld: 79 mg/dL (ref 70–99)
Sodium: 140 mEq/L (ref 135–145)

## 2013-03-28 NOTE — H&P (Signed)
[name] is an 57 y.o. female.   Chief Complaint: c/o chronic and progressive right shoulder pain. HPI: [name] has a new problem today which is severe pain in the right shoulder.  This began spontaneously several months ago.  She cannot abduct, flex or extend her shoulder.    Past Medical History  Diagnosis Date  . Hyperlipidemia   . GERD (gastroesophageal reflux disease)   . S/P radiation therapy 07/04/10 - 08/11/10    5040 cGy/28 Treatments to Left Breast  . Arthritis     knees  . Carpal tunnel syndrome of right wrist 08/2012  . Lesion of right ulnar nerve 08/2012  . History of breast cancer 04/2010    left  . Diabetes mellitus type 2, noninsulin dependent   . Hypertension     under control with med., has been on med. x 6-7 yr.  . Wears partial dentures     lower  . Wears dentures     upper    Past Surgical History  Procedure Laterality Date  . Cholecystectomy  09/16/2005  . Knee arthroscopy Left 08/11/2002  . Partial mastectomy with needle localization and axillary sentinel lymph node bx Left 05/21/2010    with re-exc. medial margin lumpectomy site  . Robotic assisted total hysterectomy with bilateral salpingo oopherectomy  09/17/2010  . Robotic assisted laparoscopic lysis of adhesion  09/17/2010  . Carpal tunnel release Right 09/21/2012    Procedure: CARPAL TUNNEL RELEASE;  Surgeon: Wyn Forster., MD;  Location: [facility];  Service: Orthopedics;  Laterality: Right;  . Ulnar nerve transposition Right 09/21/2012    Procedure: RIGHT IN SITU DECOMPRESSION ULNAR NERVE;  Surgeon: Wyn Forster., MD;  Location: [facility];  Service: Orthopedics;  Laterality: Right;  . Carpal tunnel release Left 11/30/2012    Procedure: CARPAL TUNNEL RELEASE;  Surgeon: Wyn Forster., MD;  Location: [facility];  Service: Orthopedics;  Laterality: Left;    Family History  Problem Relation Age of Onset  . Heart disease Mother   . Heart  disease Father   . Diabetes Father    Social History:  reports that she quit smoking about 2 years ago. She has never used smokeless tobacco. She reports that she does not drink alcohol or use illicit drugs.  Allergies:  Allergies  Allergen Reactions  . Adhesive [Tape] Other (See Comments)    BLISTERS  . Hydrocodone Nausea And Vomiting    No prescriptions prior to admission    No results found for this or any previous visit (from the past 48 hour(s)).  No results found.   Pertinent items are noted in HPI.  Height 5\' 2"  (1.575 m), weight 89.812 kg (198 lb).  General appearance: alert Head: Normocephalic, without obvious abnormality Neck: supple, symmetrical, trachea midline Resp: clear to auscultation bilaterally Cardio: regular rate and rhythm GI: normal findings: bowel sounds normal Extremities:   Her examination is compatible with acute bursitis and/or acute rotator cuff tendinopathy.   Plain films of her shoulder demonstrate irregularity of the greater tuberosity on the right consistent with significant rotator cuff disease.  She has unfavorable acromial and AC anatomy. The MRI documents a substantial rotator cuff tear of the supraspinatus and mild fatty atrophy of some of the rotator cuff musculature. She has partial tearing of the subscapularis and a complete tear of the long head of the biceps.   Pulses: 2+ and symmetric Skin: normal Neurologic: Grossly normal  Assessment/Plan Impression:  Right shoulder impingement with RC tea.r  Plan:To the OR for right SA with SAD/DCR and RC repair as needed.The procedure, risks,benefits and post-op course were discussed with the patient at length and they were in agreement with the plan.  [name],Leonarda Leis J 03/28/2013, 3:15 PM  H&P documentation: 03/29/2013  -History and Physical Reviewed  -Patient has been re-examined  -No change in the plan of care  Wyn Forster, MD

## 2013-03-29 ENCOUNTER — Ambulatory Visit (HOSPITAL_BASED_OUTPATIENT_CLINIC_OR_DEPARTMENT_OTHER)
Admission: RE | Admit: 2013-03-29 | Discharge: 2013-03-30 | Disposition: A | Discharge status: Home / Self Care | Payer: 59 | Source: Ambulatory Visit | Attending: Orthopedic Surgery | Admitting: Orthopedic Surgery

## 2013-03-29 ENCOUNTER — Encounter (HOSPITAL_BASED_OUTPATIENT_CLINIC_OR_DEPARTMENT_OTHER): Payer: 59 | Admitting: Anesthesiology

## 2013-03-29 ENCOUNTER — Encounter (HOSPITAL_BASED_OUTPATIENT_CLINIC_OR_DEPARTMENT_OTHER): Payer: Self-pay | Admitting: Orthopedic Surgery

## 2013-03-29 ENCOUNTER — Ambulatory Visit (HOSPITAL_BASED_OUTPATIENT_CLINIC_OR_DEPARTMENT_OTHER): Payer: 59 | Admitting: Anesthesiology

## 2013-03-29 ENCOUNTER — Encounter (HOSPITAL_BASED_OUTPATIENT_CLINIC_OR_DEPARTMENT_OTHER)
Admission: RE | Disposition: A | Discharge status: Home / Self Care | Payer: Self-pay | Source: Ambulatory Visit | Attending: Orthopedic Surgery

## 2013-03-29 DIAGNOSIS — Z853 Personal history of malignant neoplasm of breast: Secondary | ICD-10-CM | POA: Insufficient documentation

## 2013-03-29 DIAGNOSIS — Z87891 Personal history of nicotine dependence: Secondary | ICD-10-CM | POA: Insufficient documentation

## 2013-03-29 DIAGNOSIS — M75120 Complete rotator cuff tear or rupture of unspecified shoulder, not specified as traumatic: Secondary | ICD-10-CM | POA: Insufficient documentation

## 2013-03-29 DIAGNOSIS — M19019 Primary osteoarthritis, unspecified shoulder: Secondary | ICD-10-CM | POA: Insufficient documentation

## 2013-03-29 DIAGNOSIS — I10 Essential (primary) hypertension: Secondary | ICD-10-CM | POA: Insufficient documentation

## 2013-03-29 DIAGNOSIS — E119 Type 2 diabetes mellitus without complications: Secondary | ICD-10-CM | POA: Insufficient documentation

## 2013-03-29 DIAGNOSIS — M66329 Spontaneous rupture of flexor tendons, unspecified upper arm: Secondary | ICD-10-CM | POA: Insufficient documentation

## 2013-03-29 HISTORY — PX: SHOULDER OPEN ROTATOR CUFF REPAIR: SHX2407

## 2013-03-29 LAB — GLUCOSE, CAPILLARY: Glucose-Capillary: 162 mg/dL — ABNORMAL HIGH (ref 70–99)

## 2013-03-29 LAB — POCT HEMOGLOBIN-HEMACUE: Hemoglobin: 13.1 g/dL (ref 12.0–15.0)

## 2013-03-29 SURGERY — REPAIR, ROTATOR CUFF, OPEN
Anesthesia: General | Site: Shoulder | Laterality: Right

## 2013-03-29 MED ORDER — METOCLOPRAMIDE HCL 5 MG PO TABS: 5.0000 mg | ORAL_TABLET | Freq: Three times a day (TID) | ORAL | Status: DC | PRN

## 2013-03-29 MED ORDER — CEFAZOLIN SODIUM-DEXTROSE 2-3 GM-% IV SOLR
2.0000 g | Freq: Once | INTRAVENOUS | Status: AC
  Administered 2013-03-29: 2 g via INTRAVENOUS

## 2013-03-29 MED ORDER — ONDANSETRON HCL 4 MG/2ML IJ SOLN
INTRAMUSCULAR | Status: DC | PRN
  Administered 2013-03-29: 4 mg via INTRAVENOUS

## 2013-03-29 MED ORDER — CEFAZOLIN SODIUM 1-5 GM-% IV SOLN
INTRAVENOUS | Status: AC
  Filled 2013-03-29: qty 300

## 2013-03-29 MED ORDER — FENTANYL CITRATE 0.05 MG/ML IJ SOLN: 50.0000 ug | INTRAMUSCULAR | Status: DC | PRN

## 2013-03-29 MED ORDER — METFORMIN HCL 500 MG PO TABS
1000.0000 mg | ORAL_TABLET | Freq: Two times a day (BID) | ORAL | Status: DC
  Administered 2013-03-29: 500 mg via ORAL

## 2013-03-29 MED ORDER — SODIUM CHLORIDE 0.9 % IR SOLN
Status: DC | PRN
  Administered 2013-03-29: 7500 mL

## 2013-03-29 MED ORDER — PROMETHAZINE HCL 25 MG/ML IJ SOLN: 6.2500 mg | INTRAMUSCULAR | Status: DC | PRN

## 2013-03-29 MED ORDER — LIDOCAINE HCL (CARDIAC) 10 MG/ML IV SOLN
INTRAVENOUS | Status: DC | PRN
  Administered 2013-03-29: 100 mg via INTRAVENOUS

## 2013-03-29 MED ORDER — METHOCARBAMOL 100 MG/ML IJ SOLN: 500.0000 mg | Freq: Four times a day (QID) | INTRAVENOUS | Status: DC | PRN

## 2013-03-29 MED ORDER — HYDROMORPHONE HCL 2 MG PO TABS: ORAL_TABLET | ORAL | Status: DC

## 2013-03-29 MED ORDER — CEFAZOLIN SODIUM 1-5 GM-% IV SOLN: 1.0000 g | Freq: Once | INTRAVENOUS | Status: DC

## 2013-03-29 MED ORDER — SODIUM CHLORIDE 0.9 % IJ SOLN
INTRAMUSCULAR | Status: AC
  Filled 2013-03-29: qty 10

## 2013-03-29 MED ORDER — CEFAZOLIN SODIUM 1 G IJ SOLR
INTRAMUSCULAR | Status: AC
  Filled 2013-03-29: qty 10

## 2013-03-29 MED ORDER — FENTANYL CITRATE 0.05 MG/ML IJ SOLN
INTRAMUSCULAR | Status: AC
  Filled 2013-03-29: qty 4

## 2013-03-29 MED ORDER — ONDANSETRON HCL 4 MG/2ML IJ SOLN: 4.0000 mg | Freq: Four times a day (QID) | INTRAMUSCULAR | Status: DC | PRN

## 2013-03-29 MED ORDER — CHLORHEXIDINE GLUCONATE 4 % EX LIQD: 60.0000 mL | Freq: Once | CUTANEOUS | Status: DC

## 2013-03-29 MED ORDER — DEXTROSE 5 % IV SOLN
INTRAVENOUS | Status: DC | PRN
  Administered 2013-03-29: 11:00:00 via INTRAVENOUS

## 2013-03-29 MED ORDER — PROPOFOL 10 MG/ML IV BOLUS
INTRAVENOUS | Status: DC | PRN
  Administered 2013-03-29: 180 mg via INTRAVENOUS

## 2013-03-29 MED ORDER — METOCLOPRAMIDE HCL 5 MG/ML IJ SOLN: 5.0000 mg | Freq: Three times a day (TID) | INTRAMUSCULAR | Status: DC | PRN

## 2013-03-29 MED ORDER — SUCCINYLCHOLINE CHLORIDE 20 MG/ML IJ SOLN
INTRAMUSCULAR | Status: DC | PRN
  Administered 2013-03-29: 100 mg via INTRAVENOUS

## 2013-03-29 MED ORDER — HYDROMORPHONE HCL 2 MG PO TABS
ORAL_TABLET | ORAL | Status: AC
  Filled 2013-03-29: qty 1

## 2013-03-29 MED ORDER — HYDROMORPHONE HCL 2 MG PO TABS
2.0000 mg | ORAL_TABLET | ORAL | Status: DC | PRN
  Administered 2013-03-29 – 2013-03-30 (×3): 2 mg via ORAL

## 2013-03-29 MED ORDER — MIDAZOLAM HCL 2 MG/2ML IJ SOLN
INTRAMUSCULAR | Status: AC
  Filled 2013-03-29: qty 2

## 2013-03-29 MED ORDER — DEXAMETHASONE SODIUM PHOSPHATE 10 MG/ML IJ SOLN
INTRAMUSCULAR | Status: DC | PRN
  Administered 2013-03-29: 4 mg

## 2013-03-29 MED ORDER — METHOCARBAMOL 500 MG PO TABS: 500.0000 mg | ORAL_TABLET | Freq: Four times a day (QID) | ORAL | Status: DC | PRN

## 2013-03-29 MED ORDER — HYDROMORPHONE HCL PF 1 MG/ML IJ SOLN: 0.5000 mg | INTRAMUSCULAR | Status: DC | PRN

## 2013-03-29 MED ORDER — MIDAZOLAM HCL 2 MG/2ML IJ SOLN
1.0000 mg | INTRAMUSCULAR | Status: DC | PRN
  Administered 2013-03-29: 2 mg via INTRAVENOUS

## 2013-03-29 MED ORDER — FENTANYL CITRATE 0.05 MG/ML IJ SOLN
INTRAMUSCULAR | Status: AC
  Filled 2013-03-29: qty 2

## 2013-03-29 MED ORDER — SODIUM CHLORIDE 0.9 % IV SOLN
INTRAVENOUS | Status: DC
  Administered 2013-03-29: 15:00:00 via INTRAVENOUS

## 2013-03-29 MED ORDER — ONDANSETRON HCL 4 MG PO TABS: 4.0000 mg | ORAL_TABLET | Freq: Four times a day (QID) | ORAL | Status: DC | PRN

## 2013-03-29 MED ORDER — CEPHALEXIN 500 MG PO CAPS: 500.0000 mg | ORAL_CAPSULE | Freq: Three times a day (TID) | ORAL | Status: DC

## 2013-03-29 MED ORDER — LOSARTAN POTASSIUM 50 MG PO TABS: 50.0000 mg | ORAL_TABLET | Freq: Every day | ORAL | Status: DC

## 2013-03-29 MED ORDER — FENTANYL CITRATE 0.05 MG/ML IJ SOLN
50.0000 ug | Freq: Once | INTRAMUSCULAR | Status: AC
  Administered 2013-03-29: 100 ug via INTRAVENOUS

## 2013-03-29 MED ORDER — MIDAZOLAM HCL 2 MG/2ML IJ SOLN: 1.0000 mg | INTRAMUSCULAR | Status: DC | PRN

## 2013-03-29 MED ORDER — LACTATED RINGERS IV SOLN
INTRAVENOUS | Status: DC
Start: 2013-03-29 — End: 2013-03-29
  Administered 2013-03-29 (×3): via INTRAVENOUS

## 2013-03-29 MED ORDER — GABAPENTIN 300 MG PO CAPS
300.0000 mg | ORAL_CAPSULE | Freq: Two times a day (BID) | ORAL | Status: DC
  Administered 2013-03-29: 300 mg via ORAL

## 2013-03-29 MED ORDER — BUPIVACAINE-EPINEPHRINE PF 0.5-1:200000 % IJ SOLN
INTRAMUSCULAR | Status: DC | PRN
  Administered 2013-03-29: 25 mL

## 2013-03-29 MED ORDER — FAMOTIDINE 20 MG PO TABS
20.0000 mg | ORAL_TABLET | Freq: Every day | ORAL | Status: DC
  Administered 2013-03-29: 20 mg via ORAL

## 2013-03-29 MED ORDER — CEFAZOLIN SODIUM-DEXTROSE 2-3 GM-% IV SOLR
INTRAVENOUS | Status: AC
  Filled 2013-03-29: qty 50

## 2013-03-29 MED ORDER — PROPOFOL 10 MG/ML IV BOLUS
INTRAVENOUS | Status: AC
  Filled 2013-03-29: qty 20

## 2013-03-29 MED ORDER — BUPROPION HCL ER (SR) 150 MG PO TB12
150.0000 mg | ORAL_TABLET | Freq: Two times a day (BID) | ORAL | Status: DC
  Administered 2013-03-29: 150 mg via ORAL

## 2013-03-29 MED ORDER — FENTANYL CITRATE 0.05 MG/ML IJ SOLN: 25.0000 ug | INTRAMUSCULAR | Status: DC | PRN

## 2013-03-29 MED ORDER — GLUCOSE BLOOD VI STRP: 1.0000 | ORAL_STRIP | Status: DC | PRN

## 2013-03-29 MED ORDER — CEFAZOLIN SODIUM-DEXTROSE 2-3 GM-% IV SOLR
2.0000 g | Freq: Four times a day (QID) | INTRAVENOUS | Status: AC
  Administered 2013-03-29 – 2013-03-30 (×3): 2 g via INTRAVENOUS

## 2013-03-29 SURGICAL SUPPLY — 78 items
ANCHOR PEEK 4.75X19.1 SWLK C (Anchor) ×6 IMPLANT
ANCHOR SUT BIO SW 4.75 W/FIB (Anchor) ×2 IMPLANT
BANDAGE ADHESIVE 1X3 (GAUZE/BANDAGES/DRESSINGS) IMPLANT
BLADE AVERAGE 25X9 (BLADE) IMPLANT
BLADE CUTTER MENIS 5.5 (BLADE) ×2 IMPLANT
BLADE SURG 15 STRL LF DISP TIS (BLADE) ×2 IMPLANT
BLADE SURG 15 STRL SS (BLADE) ×2
BUR EGG 3PK/BX (BURR) ×2 IMPLANT
BUR OVAL 6.0 (BURR) ×2 IMPLANT
CANISTER SUCT 3000ML (MISCELLANEOUS) IMPLANT
CANISTER SUCT LVC 12 LTR MEDI- (MISCELLANEOUS) ×4 IMPLANT
CANNULA TWIST IN 8.25X7CM (CANNULA) IMPLANT
CLEANER CAUTERY TIP 5X5 PAD (MISCELLANEOUS) IMPLANT
CUTTER MENISCUS  4.2MM (BLADE) ×1
CUTTER MENISCUS 4.2MM (BLADE) ×1 IMPLANT
DECANTER SPIKE VIAL GLASS SM (MISCELLANEOUS) IMPLANT
DRAPE INCISE IOBAN 66X45 STRL (DRAPES) ×2 IMPLANT
DRAPE STERI 35X30 U-POUCH (DRAPES) ×2 IMPLANT
DRAPE SURG 17X23 STRL (DRAPES) IMPLANT
DRAPE U-SHAPE 47X51 STRL (DRAPES) ×2 IMPLANT
DRAPE U-SHAPE 76X120 STRL (DRAPES) ×4 IMPLANT
DRSG PAD ABDOMINAL 8X10 ST (GAUZE/BANDAGES/DRESSINGS) ×2 IMPLANT
DURAPREP 26ML APPLICATOR (WOUND CARE) ×2 IMPLANT
ELECT REM PT RETURN 9FT ADLT (ELECTROSURGICAL) ×2
ELECTRODE REM PT RTRN 9FT ADLT (ELECTROSURGICAL) ×1 IMPLANT
GLOVE BIOGEL M STRL SZ7.5 (GLOVE) ×2 IMPLANT
GLOVE BIOGEL PI IND STRL 7.0 (GLOVE) ×2 IMPLANT
GLOVE BIOGEL PI IND STRL 8 (GLOVE) ×2 IMPLANT
GLOVE BIOGEL PI INDICATOR 7.0 (GLOVE) ×2
GLOVE BIOGEL PI INDICATOR 8 (GLOVE) ×2
GLOVE ECLIPSE 6.5 STRL STRAW (GLOVE) ×4 IMPLANT
GLOVE ORTHO TXT STRL SZ7.5 (GLOVE) ×2 IMPLANT
GOWN BRE IMP PREV XXLGXLNG (GOWN DISPOSABLE) ×2 IMPLANT
GOWN PREVENTION PLUS XLARGE (GOWN DISPOSABLE) ×2 IMPLANT
GOWN STRL REIN 2XL XLG LVL4 (GOWN DISPOSABLE) ×2 IMPLANT
NDL SUT 6 .5 CRC .975X.05 MAYO (NEEDLE) IMPLANT
NEEDLE MAYO TAPER (NEEDLE)
NEEDLE MINI RC 24MM (NEEDLE) IMPLANT
NEEDLE SCORPION (NEEDLE) ×2 IMPLANT
PACK ARTHROSCOPY DSU (CUSTOM PROCEDURE TRAY) ×2 IMPLANT
PACK BASIN DAY SURGERY FS (CUSTOM PROCEDURE TRAY) ×2 IMPLANT
PAD CLEANER CAUTERY TIP 5X5 (MISCELLANEOUS)
PASSER SUT SWANSON 36MM LOOP (INSTRUMENTS) IMPLANT
PENCIL BUTTON HOLSTER BLD 10FT (ELECTRODE) ×2 IMPLANT
SLEEVE SCD COMPRESS KNEE MED (MISCELLANEOUS) ×2 IMPLANT
SLING ARM FOAM STRAP LRG (SOFTGOODS) ×2 IMPLANT
SLING ARM FOAM STRAP MED (SOFTGOODS) IMPLANT
SPONGE GAUZE 4X4 12PLY (GAUZE/BANDAGES/DRESSINGS) ×2 IMPLANT
SPONGE LAP 4X18 X RAY DECT (DISPOSABLE) ×2 IMPLANT
STRIP CLOSURE SKIN 1/2X4 (GAUZE/BANDAGES/DRESSINGS) ×2 IMPLANT
SUCTION FRAZIER TIP 10 FR DISP (SUCTIONS) IMPLANT
SUT ETHIBOND 2 OS 4 DA (SUTURE) IMPLANT
SUT ETHILON 4 0 PS 2 18 (SUTURE) IMPLANT
SUT FIBERWIRE #2 38 T-5 BLUE (SUTURE)
SUT FIBERWIRE 3-0 18 TAPR NDL (SUTURE)
SUT PROLENE 1 CT (SUTURE) IMPLANT
SUT PROLENE 3 0 PS 2 (SUTURE) ×2 IMPLANT
SUT TIGER TAPE 7 IN WHITE (SUTURE) ×4 IMPLANT
SUT VIC AB 0 CT1 27 (SUTURE)
SUT VIC AB 0 CT1 27XBRD ANBCTR (SUTURE) IMPLANT
SUT VIC AB 0 SH 27 (SUTURE) ×2 IMPLANT
SUT VIC AB 2-0 SH 27 (SUTURE) ×1
SUT VIC AB 2-0 SH 27XBRD (SUTURE) ×1 IMPLANT
SUT VIC AB 3-0 SH 27 (SUTURE)
SUT VIC AB 3-0 SH 27X BRD (SUTURE) IMPLANT
SUT VIC AB 3-0 X1 27 (SUTURE) IMPLANT
SUTURE FIBERWR #2 38 T-5 BLUE (SUTURE) IMPLANT
SUTURE FIBERWR 3-0 18 TAPR NDL (SUTURE) IMPLANT
SYR 3ML 23GX1 SAFETY (SYRINGE) IMPLANT
SYR BULB 3OZ (MISCELLANEOUS) IMPLANT
TAPE FIBER 2MM 7IN #2 BLUE (SUTURE) ×2 IMPLANT
TAPE PAPER 3X10 WHT MICROPORE (GAUZE/BANDAGES/DRESSINGS) ×2 IMPLANT
TOWEL OR 17X24 6PK STRL BLUE (TOWEL DISPOSABLE) ×2 IMPLANT
TUBE CONNECTING 20X1/4 (TUBING) ×6 IMPLANT
TUBING ARTHROSCOPY IRRIG 16FT (MISCELLANEOUS) ×2 IMPLANT
WAND STAR VAC 90 (SURGICAL WAND) ×2 IMPLANT
WATER STERILE IRR 1000ML POUR (IV SOLUTION) ×2 IMPLANT
YANKAUER SUCT BULB TIP NO VENT (SUCTIONS) IMPLANT

## 2013-03-29 NOTE — Transfer of Care (Signed)
Immediate Anesthesia Transfer of Care Note  Patient: ELAYSHA BEVARD  Procedure(s) Performed: Procedure(s): ROTATOR CUFF REPAIR SHOULDER OPEN WITH SUBACROMIAL DECOMPRESSION AND DISTAL CLAVICLE RESECTION (Right)  Patient Location: PACU  Anesthesia Type:GA combined with regional for post-op pain  Level of Consciousness: awake, sedated and patient cooperative  Airway & Oxygen Therapy: Patient Spontanous Breathing and Patient connected to face mask oxygen  Post-op Assessment: Report given to PACU RN and Post -op Vital signs reviewed and stable  Post vital signs: Reviewed and stable  Complications: No apparent anesthesia complications

## 2013-03-29 NOTE — Op Note (Signed)
[id number]

## 2013-03-29 NOTE — Anesthesia Procedure Notes (Addendum)
Anesthesia Regional Block:  Interscalene brachial plexus block  Pre-Anesthetic Checklist: ,, timeout performed, Correct Patient, Correct Site, Correct Laterality, Correct Procedure, Correct Position, site marked, Risks and benefits discussed,  Surgical consent,  Pre-op evaluation,  At surgeon's request and post-op pain management  Laterality: Right  Prep: chloraprep       Needles:  Injection technique: Single-shot  Needle Type: Echogenic Stimulator Needle     Needle Length: 5cm 5 cm Needle Gauge: 22 and 22 G    Additional Needles:  Procedures: ultrasound guided (picture in chart) Interscalene brachial plexus block Narrative:  Start time: 03/29/2013 10:10 AM End time: 03/29/2013 10:18 AM Injection made incrementally with aspirations every 3 mL. Anesthesiologist: Dr Gypsy Balsam  Additional Notes: 1010-1018 R ISB POP CHG prep, sterile tech #22 echo needle with good [place] visualization-PIX in chart Vernia Buff .5% w/epi 1:200000 total 25cc+decadron 4mg  infiltrated Multiple neg asp No compl Dr Gypsy Balsam   Procedure Name: Intubation Date/Time: 03/29/2013 10:34 AM Performed by: Gar Gibbon Pre-anesthesia Checklist: Patient identified, Emergency Drugs available, Suction available and Patient being monitored Patient Re-evaluated:Patient Re-evaluated prior to inductionOxygen Delivery Method: Circle System Utilized Preoxygenation: Pre-oxygenation with 100% oxygen Intubation Type: IV induction Ventilation: Mask ventilation without difficulty Laryngoscope Size: Mac and 3 Grade View: Grade III Tube type: Oral Number of attempts: 1 Airway Equipment and Method: stylet and oral airway Placement Confirmation: ETT inserted through vocal cords under direct vision,  positive ETCO2 and breath sounds checked- equal and bilateral Tube secured with: Tape Dental Injury: Teeth and Oropharynx as per pre-operative assessment

## 2013-03-29 NOTE — Brief Op Note (Signed)
03/29/2013  12:18 PM  PATIENT:  [name]  57 y.o. female  PRE-OPERATIVE DIAGNOSIS:  RIGHT ROTATOR CUFF TEAR, ACROMIAL DEGENERATIVE JOINT DISEASE  POST-OPERATIVE DIAGNOSIS:  RIGHT ROTATOR CUFF TEAR, ACROMIAL DEGENERATIVE JOINT DISEASE, BICEPS TENDON TEAR  PROCEDURE:  Procedure(s): ROTATOR CUFF REPAIR SHOULDER OPEN WITH SUBACROMIAL DECOMPRESSION AND DISTAL CLAVICLE RESECTION (Right)  SURGEON:  Surgeon(s) and Role:    * Wyn Forster., MD - Primary  PHYSICIAN ASSISTANT:   ASSISTANTS: Mallory Shirk.A-C   ANESTHESIA:   general  EBL:  Total I/O In: 1250 [I.V.:1250] Out: -   BLOOD ADMINISTERED:none  DRAINS: none   LOCAL MEDICATIONS USED:  PLEXUS BLOCK PREOPERATIVELY  SPECIMEN:  No Specimen  DISPOSITION OF SPECIMEN:  N/A  COUNTS:  YES  TOURNIQUET:  * No tourniquets in log *  DICTATION: .Other Dictation: Dictation Number [id number]  PLAN OF CARE: Admit for overnight observation  PATIENT DISPOSITION:  PACU - hemodynamically stable.   Delay start of Pharmacological VTE agent (>24hrs) due to surgical blood loss or risk of bleeding: not applicable

## 2013-03-29 NOTE — Anesthesia Preprocedure Evaluation (Signed)
Anesthesia Evaluation  Patient identified by MRN, date of birth, ID band Patient awake    Reviewed: Allergy & Precautions, H&P , NPO status , Patient's Chart, lab work & pertinent test results  Airway Mallampati: II TM Distance: >3 FB Neck ROM: Full    Dental no notable dental hx. (+) Teeth Intact and Dental Advisory Given   Pulmonary neg pulmonary ROS, former smoker,  breath sounds clear to auscultation  Pulmonary exam normal       Cardiovascular hypertension, On Medications and On Home Beta Blockers Rhythm:Regular Rate:Normal     Neuro/Psych  Neuromuscular disease negative psych ROS   GI/Hepatic Neg liver ROS, GERD-  Medicated and Controlled,  Endo/Other  diabetes, Type 2, Oral Hypoglycemic AgentsMorbid obesity  Renal/GU negative Renal ROS  negative genitourinary   Musculoskeletal   Abdominal (+) + obese,   Peds  Hematology negative hematology ROS (+)   Anesthesia Other Findings   Reproductive/Obstetrics negative OB ROS                           Anesthesia Physical Anesthesia Plan  ASA: III  Anesthesia Plan: General   Post-op Pain Management:    Induction: Intravenous  Airway Management Planned: Oral ETT  Additional Equipment:   Intra-op Plan:   Post-operative Plan: Extubation in OR  Informed Consent: I have reviewed the patients History and Physical, chart, labs and discussed the procedure including the risks, benefits and alternatives for the proposed anesthesia with the patient or authorized representative who has indicated his/her understanding and acceptance.     Plan Discussed with: CRNA and Surgeon  Anesthesia Plan Comments:         Anesthesia Quick Evaluation

## 2013-03-29 NOTE — Progress Notes (Signed)
Assisted Dr. Kasik with right, ultrasound guided, interscalene  block. Side rails up, monitors on throughout procedure. See vital signs in flow sheet. Tolerated Procedure well. 

## 2013-03-29 NOTE — Anesthesia Postprocedure Evaluation (Signed)
  Anesthesia Post-op Note  Patient: [name]  Procedure(s) Performed: Procedure(s): ROTATOR CUFF REPAIR SHOULDER OPEN WITH SUBACROMIAL DECOMPRESSION AND DISTAL CLAVICLE RESECTION (Right)  Patient Location: PACU  Anesthesia Type:GA combined with regional for post-op pain  Level of Consciousness: awake and alert   Airway and Oxygen Therapy: Patient Spontanous Breathing  Post-op Pain: none  Post-op Assessment: Post-op Vital signs reviewed, Patient's Cardiovascular Status Stable, Respiratory Function Stable, Patent Airway, No signs of Nausea or vomiting and Pain level controlled  Post-op Vital Signs: Reviewed and stable  Complications: No apparent anesthesia complications

## 2013-03-30 LAB — GLUCOSE, CAPILLARY: Glucose-Capillary: 113 mg/dL — ABNORMAL HIGH (ref 70–99)

## 2013-03-30 MED ORDER — CEFAZOLIN SODIUM 1 G IJ SOLR
INTRAMUSCULAR | Status: AC
  Filled 2013-03-30: qty 10

## 2013-03-30 MED ORDER — HYDROMORPHONE HCL 2 MG PO TABS
ORAL_TABLET | ORAL | Status: AC
  Filled 2013-03-30: qty 1

## 2013-03-30 MED ORDER — SODIUM CHLORIDE 0.9 % IJ SOLN
INTRAMUSCULAR | Status: AC
  Filled 2013-03-30: qty 10

## 2013-03-30 NOTE — Op Note (Signed)
NAMEZAMIRAH, [name]                ACCOUNT NO.:  [id number]  MEDICAL RECORD NO.:  [id number]  LOCATION:                                 FACILITY:  PHYSICIAN:  Katy Fitch. Vernica Wachtel, M.D.      DATE OF BIRTH:  DATE OF PROCEDURE:  03/29/2013 DATE OF DISCHARGE:                              OPERATIVE REPORT   PREOPERATIVE DIAGNOSES:  MRI-documented retracted 2 or 3 tendon rotator cuff tear with rupture of long head of biceps, very unfavorable acromial anatomy, and significant osteophyte at distal clavicle.  POSTOPERATIVE DIAGNOSES:  Grade 3 tear of subscapularis, avulsion of supraspinatus and infraspinatus rotator cuff tendons with reactive osteophyte formation in greater tuberosity, rupture of long head of biceps at bicipital groove, and moderate labral degenerative changes with acromioclavicular degenerative arthritis and unfavorable acromial morphology.  OPERATIONS: 1. Diagnostic arthroscopy of right glenohumeral joint. 2. Arthroscopic debridement of biceps tendon stump, labrum and rotator     cuff tear fragments. 3. Arthroscopic subacromial decompression with acromioplasty,     bursectomy, coracoacromial ligament partial release. 4. Arthroscopic distal clavicle section. 5. Arthroscopic reconstruction of subscapularis and open hybrid     reconstruction of supraspinatus and infraspinatus rotator cuff     tendons utilizing a medial swivel lock, a mattress stitch in the     subscapularis, and 2 mattress sutures in the supraspinatus and     infraspinatus with a medial footprint suture at the anterior     supraspinatus, total of 3 lateral swivel locks were utilized and 1     medial swivel lock.  OPERATING SURGEON:  Katy Fitch. Charlann Wayne, M.D.  ASSISTANT:  Jonni Sanger, P.A.  ANESTHESIA:  General endotracheal supplemented by a right plexus block, supervising anesthesiologist Dr. Gypsy Balsam.  INDICATIONS:  [name] is a 57 year old woman referred through the courtesy of Dr. Ruthann Cancer, Oncology for evaluation and management of shoulder pain and carpal tunnel syndrome.  [name] has completed treatment for breast cancer under Dr. Darnelle Catalan supervision.  She was noted to have significant right shoulder pain and evidence of carpal tunnel syndrome.  She was treated for her hand elements and then underwent an evaluation of her shoulder including MRI imaging.  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 Her long head of biceps was ruptured and retracted within the joint.  We advised [name] when she was cleared by her medical doctors to proceed with arthroscopic evaluation of her shoulder, anticipating subacromial decompression, distal clavicle resection, reconstruction of rotator cuff and possible biceps tenodesis.  DESCRIPTION OF PROCEDURE:  After informed consent, she was brought to the operating room at this time.  Preoperatively, she was interviewed by Dr. Gypsy Balsam of Anesthesia.  General anesthesia by endotracheal technique was recommended and accepted.  Dr. Gypsy Balsam placed a plexus block with a long-acting local anesthetic in the holding area under ultrasound guidance without complication.  After a detailed anesthesia informed consent, and questions being invited and answered in the holding area, I marked her right arm with proper surgical site per protocol and marking pen, and transferred [name]  to room 2 of the [facility].  In room 2 under Dr. Burnett Corrente direct supervision, general anesthesia by endotracheal technique was induced followed by careful positioning in the beach-chair position with the aid of a torso and head holder designed for shoulder arthroscopy.  Passive compression devices were applied to her calves at entry to the OR.  The entire right upper extremity and forequarter were prepped with DuraPrep  and draped with impervious arthroscopy drapes.  A 2 g of Ancef administered as an IV prophylactic antibiotic preoperatively.  Following routine surgical time-out, the shoulder was instrumented with a switching stick anteriorly with placement of the scope through the standard posterior viewing portal.  Diagnostic arthroscopy revealed complete avulsion of the supraspinatus and infraspinatus rotator cuff tendons, significant reactive bone formation at the greater tuberosity and a rupture long head of the biceps with the entire proximal stump retracted within the joint.  I could not identify the distal portion of the tendon.  The subscapularis had a grade 3 tear noted on posterior shift test.  The tendons were rather degenerative, and anterior-superior lateral port was created and a suction shaver was used to debride the rotator cuff tendons.  The greater tuberosity, the subscapularis, and to decorticate the lesser tuberosity to bleeding bone surface.  An anterior portal was created followed by debridement of the long head of biceps stump to a stable base at the superior labrum and debridement of labrum and reactive synovitis.  After hemostasis was achieved, the scope was removed from glenohumeral joint and placed in subacromial space.  A thorough bursectomy was accomplished followed by partial release of coracoacromial ligament.  The acromion was leveled to a type 1 morphology with a suction shaver followed by use of electrocautery to take down the capsule of the [facility] joint.  The distal clavicle was very prominent.  It was documented with digital camera.  The distal cm clavicle was removed with the arthroscopic burr.  Hemostasis was achieved with bipolar cautery.  Attention was then directed to the rotator cuff.  We replaced the scope in the joint and I placed a fiber tape in the subscapularis to the anterior, superior, lateral portal with the use of a scorpion suture passer.  We then  performed an anterior-middle deltoid splitting incision, and remove redundant bursa.  There was a very hypertrophic sleeve of pseudotendon and bursa surrounding the long head of the biceps that had ruptured.  This was used an arthroscopic burr to lower the profile of the greater tuberosity to a smooth bleeding bone surface, followed by placement of a mattress stitch in the anterior infraspinatus and a mattress stitch in the central aspect of the supraspinatus with a scorpion suture passer.  I then placed a swivel lock at the anterior supraspinatus footprint followed by use of the scorpion suture passer to pass the fiber tape in a proper position through the supraspinatus and the FiberWire more anteriorly to create a mattress stitch.  We then reconstructed the anatomic footprint of the rotator cuff by using the stitch in the infraspinatus and the subscapularis to a lateral swivel lock, readvancing all tendons to an anatomic footprint.  I then used 2 lateral swivel locks with a double Diamondback technique to compress the anatomic footprint of the rotator cuff with anatomic reconstruction of the infraspinatus, supraspinatus, and subscapularis.  The scope was placed in the joint and photographic documentation of the anatomic footprint was accomplished.  The glenohumeral joint was debrided with a suction shaver followed by thorough irrigation.  The subacromial space was likewise debrided.  I used a hand rasp to feather the edge of the lateral and anterior acromion.  The deltoid split was then repaired with figure-of-eight suture of 0 Vicryl followed by repair of the skin with subcutaneous 2-0 Vicryl and intradermal 3-0 Prolene. The portals were repaired with intradermal 3-0 Prolene.  An anatomic reconstruction of [name]'s rotator cuff was accomplished with debridement of the biceps.  There were no apparent complications.  Due to the magnitude of this procedure, this would be consider  rotator cuff reconstruction rather than repair.  [name] was placed in a sling, awakened in general anesthesia and transferred to the recovery room in stable signs.  Note that [name] will be admitted to [facility] for monitoring of her glucose to the type 2 by diabetes, her vital signs including pulse and blood pressure as well as O2 saturation.     Katy Fitch Josian Lanese, M.D.     RVS/MEDQ  D:  03/29/2013  T:  03/30/2013  Job:  [id number]  cc:   Lowella Dell, M.D.

## 2013-04-04 ENCOUNTER — Encounter (HOSPITAL_BASED_OUTPATIENT_CLINIC_OR_DEPARTMENT_OTHER): Payer: Self-pay | Admitting: Orthopedic Surgery

## 2013-04-19 ENCOUNTER — Telehealth: Payer: Self-pay | Admitting: *Deleted

## 2013-04-19 NOTE — Telephone Encounter (Signed)
Lm informed the pt that GCM will be on pal 06/13/13. gv appt for 06/02/13 w/labs@ 2p and ov@ 2:30p. Made pt aware that i will mail a letter/avs...td

## 2013-05-02 ENCOUNTER — Other Ambulatory Visit: Payer: Self-pay | Admitting: Oncology

## 2013-05-02 ENCOUNTER — Telehealth: Payer: Self-pay | Admitting: *Deleted

## 2013-05-02 NOTE — Telephone Encounter (Signed)
Pt called to cancel her appts for 06/02/13 and to rs. gv appts for 07/07/13 w/ labs@9am  and ov@ 9:30am. Pt is aware...td

## 2013-05-03 ENCOUNTER — Ambulatory Visit (INDEPENDENT_AMBULATORY_CARE_PROVIDER_SITE_OTHER): Payer: 59 | Admitting: General Surgery

## 2013-06-02 ENCOUNTER — Other Ambulatory Visit: Payer: 59

## 2013-06-02 ENCOUNTER — Ambulatory Visit: Payer: 59 | Admitting: Oncology

## 2013-06-13 ENCOUNTER — Ambulatory Visit: Payer: 59 | Admitting: Oncology

## 2013-06-13 ENCOUNTER — Other Ambulatory Visit: Payer: 59

## 2013-07-06 ENCOUNTER — Other Ambulatory Visit: Payer: Self-pay | Admitting: *Deleted

## 2013-07-06 DIAGNOSIS — C50412 Malignant neoplasm of upper-outer quadrant of left female breast: Secondary | ICD-10-CM

## 2013-07-07 ENCOUNTER — Ambulatory Visit (HOSPITAL_BASED_OUTPATIENT_CLINIC_OR_DEPARTMENT_OTHER): Payer: 59 | Admitting: Oncology

## 2013-07-07 ENCOUNTER — Other Ambulatory Visit (HOSPITAL_BASED_OUTPATIENT_CLINIC_OR_DEPARTMENT_OTHER): Payer: 59

## 2013-07-07 ENCOUNTER — Telehealth: Payer: Self-pay | Admitting: *Deleted

## 2013-07-07 VITALS — BP 152/83 | HR 109 | Temp 98.4°F | Resp 20 | Ht 62.0 in | Wt 210.4 lb

## 2013-07-07 DIAGNOSIS — C50412 Malignant neoplasm of upper-outer quadrant of left female breast: Secondary | ICD-10-CM

## 2013-07-07 DIAGNOSIS — Z170 Estrogen receptor positive status [ER+]: Secondary | ICD-10-CM

## 2013-07-07 DIAGNOSIS — C50919 Malignant neoplasm of unspecified site of unspecified female breast: Secondary | ICD-10-CM

## 2013-07-07 LAB — COMPREHENSIVE METABOLIC PANEL (CC13)
ALT: 28 U/L (ref 0–55)
ANION GAP: 15 meq/L — AB (ref 3–11)
AST: 20 U/L (ref 5–34)
Albumin: 3.9 g/dL (ref 3.5–5.0)
Alkaline Phosphatase: 151 U/L — ABNORMAL HIGH (ref 40–150)
BUN: 24.8 mg/dL (ref 7.0–26.0)
CALCIUM: 9.5 mg/dL (ref 8.4–10.4)
CHLORIDE: 104 meq/L (ref 98–109)
CO2: 25 meq/L (ref 22–29)
CREATININE: 0.9 mg/dL (ref 0.6–1.1)
GLUCOSE: 81 mg/dL (ref 70–140)
Potassium: 4.3 mEq/L (ref 3.5–5.1)
Sodium: 143 mEq/L (ref 136–145)
Total Bilirubin: 0.53 mg/dL (ref 0.20–1.20)
Total Protein: 7.1 g/dL (ref 6.4–8.3)

## 2013-07-07 LAB — CBC WITH DIFFERENTIAL/PLATELET
BASO%: 0.7 % (ref 0.0–2.0)
BASOS ABS: 0.1 10*3/uL (ref 0.0–0.1)
EOS%: 5.6 % (ref 0.0–7.0)
Eosinophils Absolute: 0.5 10*3/uL (ref 0.0–0.5)
HEMATOCRIT: 38.5 % (ref 34.8–46.6)
HEMOGLOBIN: 12.3 g/dL (ref 11.6–15.9)
LYMPH#: 1.9 10*3/uL (ref 0.9–3.3)
LYMPH%: 21.2 % (ref 14.0–49.7)
MCH: 29.9 pg (ref 25.1–34.0)
MCHC: 31.9 g/dL (ref 31.5–36.0)
MCV: 93.4 fL (ref 79.5–101.0)
MONO#: 0.6 10*3/uL (ref 0.1–0.9)
MONO%: 6.5 % (ref 0.0–14.0)
NEUT#: 5.7 10*3/uL (ref 1.5–6.5)
NEUT%: 66 % (ref 38.4–76.8)
PLATELETS: 234 10*3/uL (ref 145–400)
RBC: 4.12 10*6/uL (ref 3.70–5.45)
RDW: 14.1 % (ref 11.2–14.5)
WBC: 8.7 10*3/uL (ref 3.9–10.3)

## 2013-07-07 NOTE — Progress Notes (Signed)
[name]  DOB: 07-21-1955  MR#: [id number]  CSN#: [id number]    PCP: Crystal Downing, MD GYN: SU:  OTHER MD: [name]   History of breast cancer:   [name] is a 58 year old [facility] woman referred by Dr. Dalbert [name] for evaluation and treatment of newly diagnosed breast cancer.  [name] felt fine when she underwent routine mammographic screening June 12, 2009 at [place].  Dr. Marcelo [name] noted a potential abnormality on the left breast so on the April 17, 2010 the patient was recalled for diagnostic left mammography and ultrasonography.  He found a persistent 7 mm mass with irregular margins, which by ultrasound measured 7 mm and was irregular and hypoechoic.  A 4 mm benign appearing cyst was incidentally noted anterior to this lesion.  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  With this information the patient was referred to Dr. Dalbert [name] and bilateral breast MRIs were obtained May 01, 2009.  This showed a 1.3 cm enhancing mass in the upper central portion of the left breast associated with the marker clip from the prior biopsy.  There were no other areas of concern in either breast and there were no suspicious internal mammary or axillary lymph nodes.  Accordingly on May 21, 2010 the patient underwent left lumpectomy and axillary lymph node sampling.  The results of this procedure ([id number]) confirmed an invasive ductal carcinoma, grade 2 with clear and ample margins and no evidence of lymphovascular invasion.  Both sentinel lymph nodes were clear.  HER-2/neu was repeated and was again negative.  Interval history: [name] returns today for followup of her breast cancer. Since her last visit here  she "has been keeping [name] busy," with multiple surgeries including a right rotator cuff, all of which have gone really well, she tells me. She has been out of work since May 2014. She just had her mammogram and bone density both very favorable. She is tolerating the letrozole quite well.  Review of systems:   She still has some discomfort in her surgical shoulder but that is getting better. Her dentures don't fit well. She sleeps on 3 pillows. Her heartburn is moderate. Her hot flashes are well-controlled. Overall a detailed review of systems today was entirely stable  Past medical history:      Past Medical History  Diagnosis Date  . Hyperlipidemia   . GERD (gastroesophageal reflux disease)   . S/P radiation therapy 07/04/10 - 08/11/10    5040 cGy/28 Treatments to Left Breast  . Arthritis     knees  . Carpal tunnel syndrome of right wrist 08/2012  . Lesion of right ulnar nerve 08/2012  . History of breast cancer 04/2010    left  . Diabetes mellitus type 2, noninsulin dependent   . Hypertension     under control with med., has been on med. x 6-7 yr.  . Wears partial dentures     lower  . Wears dentures     upper  60 P/Y tobacco abuse, discontinued JAN 2012 Diabetes mellitus Remote migraines osteoarthritis  Past surgical history:      Past Surgical History  Procedure Laterality Date  . Cholecystectomy  09/16/2005  . Knee arthroscopy Left 08/11/2002  . Partial mastectomy with needle localization and  axillary sentinel lymph node bx Left 05/21/2010    with re-exc. medial margin lumpectomy site  . Robotic assisted total hysterectomy with bilateral salpingo oopherectomy  09/17/2010  . Robotic assisted laparoscopic lysis of adhesion  09/17/2010  . Carpal tunnel release Right 09/21/2012    Procedure: CARPAL TUNNEL RELEASE;  Surgeon: Cammie Sickle., MD;  Location: [place];  Service: Orthopedics;  Laterality: Right;  . Ulnar nerve transposition Right 09/21/2012     Procedure: RIGHT IN SITU DECOMPRESSION ULNAR NERVE;  Surgeon: Cammie Sickle., MD;  Location: [place];  Service: Orthopedics;  Laterality: Right;  . Carpal tunnel release Left 11/30/2012    Procedure: CARPAL TUNNEL RELEASE;  Surgeon: Cammie Sickle., MD;  Location: [place];  Service: Orthopedics;  Laterality: Left;  . Shoulder open rotator cuff repair Right 03/29/2013    Procedure: ROTATOR CUFF REPAIR SHOULDER OPEN WITH SUBACROMIAL DECOMPRESSION AND DISTAL CLAVICLE RESECTION;  Surgeon: Cammie Sickle., MD;  Location: [place];  Service: Orthopedics;  Laterality: Right;    Family history:     Family History  Problem Relation Age of Onset  . Heart disease Mother   . Heart disease Father   . Diabetes Father   The patient's father died at the age of 36 from heart problems.  The patient's mother died at the age of 67 with heart problems.  There is one aunt and one uncle on each side with history of lung cancer.  There is no history of breast or ovarian cancer in the family to her knowledge.  Gynecologic history: She is GX, P0, menarche age 68.  Last menstrual period about 10 years ago.  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  Her husband of 53 years [name] is a "househusband."  They have about four acres and keep chickens, goats, and other animals.  The patient is not a church attender.     ADVANCED DIRECTIVES:Not in place  Health maintenance:       History  Substance Use Topics  . Smoking status: Former Smoker    Quit date: 05/07/2010  . Smokeless tobacco: Never Used  . Alcohol Use: No      Colonoscopy:  Overdue?  PAP: Due now  B[facility]: March 2015 [facility]  Cholesterol: Followed by PCP    Allergies:     Allergies  Allergen Reactions  . Adhesive [Tape] Other (See  Comments)    BLISTERS  . Hydrocodone Nausea And Vomiting    Medications:      Current Outpatient Prescriptions  Medication Sig Dispense Refill  . acetaminophen-codeine (TYLENOL #3) 300-30 MG per tablet Take 1 tablet by mouth every 4 (four) hours as needed for moderate pain.      Marland Kitchen atorvastatin (LIPITOR) 40 MG tablet       . buPROPion (WELLBUTRIN SR) 150 MG 12 hr tablet Take 150 mg by mouth 2 (two) times daily.       . cephALEXin (KEFLEX) 500 MG capsule Take 1 capsule (500 mg total) by mouth 3 (three) times daily.  12 capsule  0  . gabapentin (NEURONTIN) 300 MG capsule 300 mg at bedtime. MAY TAKE 2 CAPSULES BY MOUTH AT BEDTIME      . HYDROmorphone (DILAUDID) 2 MG tablet 1 or 2 tabs every 4 hours  as needed for pain  30 tablet  0  . letrozole (FEMARA) 2.5 MG tablet TAKE 1 TABLET EVERY DAY  90 tablet  3  . losartan (COZAAR) 100 MG tablet Take 50 mg by mouth Daily.       Marland Kitchen lovastatin (MEVACOR) 10 MG tablet Take 10 mg by mouth at bedtime.      . metFORMIN (GLUCOPHAGE) 1000 MG tablet Take 1,000 mg by mouth 2 (two) times daily with a meal. 1067m po QAM and 500 mg PO QHS      . naproxen sodium (ANAPROX) 220 MG tablet Take 220 mg by mouth 2 (two) times daily with a meal.      . ONE TOUCH ULTRA TEST test strip Daily.      . ranitidine (ZANTAC) 300 MG tablet Take 300 mg by mouth at bedtime.       . traMADol (ULTRAM) 50 MG tablet       . zolpidem (AMBIEN) 5 MG tablet        No current facility-administered medications for this visit.    Physical exam:     Middle-aged white female using a walking stick Filed Vitals:   07/07/13 0852  BP: 152/83  Pulse: 109  Temp: 98.4 F (36.9 C)  Resp: 20     Body mass index is 38.47 kg/(m^2).  ECOG PS: 1  Filed Weights   07/07/13 0852  Weight: 210 lb 6.4 oz (95.437 kg)   Sclerae unicteric, pupils round and equal Oropharynx clear and moist No cervical or supraclavicular adenopathy Lungs no rales or rhonchi Heart regular rate and rhythm Abd soft,  obese, nontender, positive bowel sounds MSK no focal spinal tenderness, no upper extremity lymphedema Neuro: nonfocal, well oriented, pleasant affect Breasts: The right breast is unremarkable. The left breast is status post lumpectomy and radiation. The cosmetic result is good. There is no evidence of local recurrence.  The left axilla is benign  Lab results:    CBC    Component Value Date/Time   WBC 8.7 07/07/2013 0840   WBC 16.9* 09/18/2010 0640   RBC 4.12 07/07/2013 0840   RBC 3.94 09/18/2010 0640   HGB 12.3 07/07/2013 0840   HGB 13.1 03/29/2013 0958   HCT 38.5 07/07/2013 0840   HCT 39.0 11/30/2012 0900   PLT 234 07/07/2013 0840   PLT 216 09/18/2010 0640   MCV 93.4 07/07/2013 0840   MCV 97.5 09/18/2010 0640   MCH 29.9 07/07/2013 0840   MCH 31.0 09/18/2010 0640   MCHC 31.9 07/07/2013 0840   MCHC 31.8 09/18/2010 0640   RDW 14.1 07/07/2013 0840   RDW 13.8 09/18/2010 0640   LYMPHSABS 1.9 07/07/2013 0840   LYMPHSABS 3.6 05/17/2010 1139   MONOABS 0.6 07/07/2013 0840   MONOABS 0.7 05/17/2010 1139   EOSABS 0.5 07/07/2013 0840   EOSABS 0.3 05/17/2010 1139   BASOSABS 0.1 07/07/2013 0840   BASOSABS 0.1 05/17/2010 1139   BMET    Component Value Date/Time   NA 140 03/22/2013 1200   NA 143 01/17/2013 1113   K 4.8 03/22/2013 1200   K 4.7 01/17/2013 1113   CL 103 03/22/2013 1200   CL 104 06/01/2012 0850   CO2 27 03/22/2013 1200   CO2 25 01/17/2013 1113   GLUCOSE 79 03/22/2013 1200   GLUCOSE 88 01/17/2013 1113   GLUCOSE 84 06/01/2012 0850   BUN 20 03/22/2013 1200   BUN 19.1 01/17/2013 1113   CREATININE 0.83 03/22/2013 1200   CREATININE 0.9 01/17/2013 1113  CALCIUM 9.6 03/22/2013 1200   CALCIUM 9.8 01/17/2013 1113   GFRNONAA 76* 03/22/2013 1200   GFRAA 88* 03/22/2013 1200     Studies:    DEXA scan 06/24/2013 was normal with the lowest reading at -0.7. Mammography 06-2013 was unremarkable  Assessment: 58 y.o.  [name] woman   (1) status post left lumpectomy and sentinel lymph node sampling in January  2012 for a T1C N0, Stage IA  invasive ductal carcinoma, grade 2, strongly estrogen and progesterone receptor positive, HER2/neu negative, with a borderline/low MIB-1.   (2) Oncotype DX score in the low range, predicting an 8% risk of recurrence within 10 years if she took tamoxifen for 5 years.   (3) Status post radiation, completed April 2012.   (4) On letrozole 2.5 mg daily since May 2012 with good tolerance. Bone density March 2015 normal    (5) Status post hysterectomy/bilateral salpingo-oophorectomy in May 2012   Plan:  Keishawna is doing terrific as far as her breast cancer is concerned she is going to have her next mammogram in January and I have added a bone density at the same time. She will see me in February. If the bone density remains in the normal range, the plan will be to continue letrozole for a total of 5 years. Otherwise we will consider switching to tamoxifen. Samhita is a good understanding of the overall plan. She knows to call for any problems that may develop before her next visit here.  Jenesys Casseus C 07/07/2013

## 2013-07-07 NOTE — Telephone Encounter (Signed)
appts made and printed...td 

## 2014-04-29 ENCOUNTER — Other Ambulatory Visit: Payer: Self-pay | Admitting: Oncology

## 2014-04-29 DIAGNOSIS — C50412 Malignant neoplasm of upper-outer quadrant of left female breast: Secondary | ICD-10-CM

## 2014-05-15 ENCOUNTER — Telehealth: Payer: Self-pay | Admitting: Oncology

## 2014-05-15 NOTE — Telephone Encounter (Signed)
pt cld left vm wanting appt time *dtae-cld & left vm and left pt time & date of appt in April

## 2014-07-12 ENCOUNTER — Other Ambulatory Visit (HOSPITAL_COMMUNITY): Payer: Self-pay | Admitting: Family Medicine

## 2014-07-12 DIAGNOSIS — M79671 Pain in right foot: Secondary | ICD-10-CM

## 2014-07-12 DIAGNOSIS — M79662 Pain in left lower leg: Secondary | ICD-10-CM

## 2014-07-12 DIAGNOSIS — R0989 Other specified symptoms and signs involving the circulatory and respiratory systems: Secondary | ICD-10-CM

## 2014-07-12 DIAGNOSIS — M79672 Pain in left foot: Principal | ICD-10-CM

## 2014-07-12 DIAGNOSIS — M79661 Pain in right lower leg: Secondary | ICD-10-CM

## 2014-07-13 ENCOUNTER — Ambulatory Visit (HOSPITAL_COMMUNITY)
Admission: RE | Admit: 2014-07-13 | Discharge: 2014-07-13 | Disposition: A | Discharge status: Home / Self Care | Payer: 59 | Source: Ambulatory Visit | Attending: Family Medicine | Admitting: Family Medicine

## 2014-07-13 DIAGNOSIS — M79662 Pain in left lower leg: Secondary | ICD-10-CM | POA: Insufficient documentation

## 2014-07-13 DIAGNOSIS — M79671 Pain in right foot: Secondary | ICD-10-CM | POA: Insufficient documentation

## 2014-07-13 DIAGNOSIS — M79661 Pain in right lower leg: Secondary | ICD-10-CM | POA: Diagnosis not present

## 2014-07-13 DIAGNOSIS — M79672 Pain in left foot: Secondary | ICD-10-CM

## 2014-07-13 NOTE — Progress Notes (Signed)
ABI completed, no evidence for arterial insufficiency. [place]

## 2014-08-03 ENCOUNTER — Ambulatory Visit (HOSPITAL_BASED_OUTPATIENT_CLINIC_OR_DEPARTMENT_OTHER): Payer: 59 | Admitting: Nurse Practitioner

## 2014-08-03 ENCOUNTER — Telehealth: Payer: Self-pay | Admitting: Nurse Practitioner

## 2014-08-03 ENCOUNTER — Encounter: Payer: Self-pay | Admitting: Nurse Practitioner

## 2014-08-03 ENCOUNTER — Telehealth: Payer: Self-pay | Admitting: General Practice

## 2014-08-03 ENCOUNTER — Other Ambulatory Visit (HOSPITAL_BASED_OUTPATIENT_CLINIC_OR_DEPARTMENT_OTHER): Payer: 59

## 2014-08-03 VITALS — BP 143/66 | HR 81 | Temp 97.5°F | Resp 18 | Ht 62.0 in | Wt 223.7 lb

## 2014-08-03 DIAGNOSIS — C50412 Malignant neoplasm of upper-outer quadrant of left female breast: Secondary | ICD-10-CM

## 2014-08-03 DIAGNOSIS — Z170 Estrogen receptor positive status [ER+]: Secondary | ICD-10-CM | POA: Diagnosis not present

## 2014-08-03 DIAGNOSIS — C50112 Malignant neoplasm of central portion of left female breast: Secondary | ICD-10-CM

## 2014-08-03 LAB — CBC WITH DIFFERENTIAL/PLATELET
BASO%: 0.9 % (ref 0.0–2.0)
Basophils Absolute: 0.1 10*3/uL (ref 0.0–0.1)
EOS%: 5.4 % (ref 0.0–7.0)
Eosinophils Absolute: 0.4 10*3/uL (ref 0.0–0.5)
HCT: 39.5 % (ref 34.8–46.6)
HGB: 12.5 g/dL (ref 11.6–15.9)
LYMPH#: 1.8 10*3/uL (ref 0.9–3.3)
LYMPH%: 24.1 % (ref 14.0–49.7)
MCH: 29.8 pg (ref 25.1–34.0)
MCHC: 31.6 g/dL (ref 31.5–36.0)
MCV: 94 fL (ref 79.5–101.0)
MONO#: 0.6 10*3/uL (ref 0.1–0.9)
MONO%: 7.8 % (ref 0.0–14.0)
NEUT#: 4.7 10*3/uL (ref 1.5–6.5)
NEUT%: 61.8 % (ref 38.4–76.8)
Platelets: 244 10*3/uL (ref 145–400)
RBC: 4.2 10*6/uL (ref 3.70–5.45)
RDW: 14.2 % (ref 11.2–14.5)
WBC: 7.5 10*3/uL (ref 3.9–10.3)

## 2014-08-03 LAB — COMPREHENSIVE METABOLIC PANEL (CC13)
ALK PHOS: 138 U/L (ref 40–150)
ALT: 24 U/L (ref 0–55)
ANION GAP: 10 meq/L (ref 3–11)
AST: 18 U/L (ref 5–34)
Albumin: 3.7 g/dL (ref 3.5–5.0)
BILIRUBIN TOTAL: 0.81 mg/dL (ref 0.20–1.20)
BUN: 11.8 mg/dL (ref 7.0–26.0)
CALCIUM: 9.5 mg/dL (ref 8.4–10.4)
CO2: 27 mEq/L (ref 22–29)
CREATININE: 0.8 mg/dL (ref 0.6–1.1)
Chloride: 107 mEq/L (ref 98–109)
EGFR: 79 mL/min/{1.73_m2} — ABNORMAL LOW (ref >90)
Glucose: 101 mg/dl (ref 70–140)
Potassium: 4.4 mEq/L (ref 3.5–5.1)
Sodium: 143 mEq/L (ref 136–145)
Total Protein: 7.1 g/dL (ref 6.4–8.3)

## 2014-08-03 NOTE — Telephone Encounter (Signed)
Appointments made and avs printed for patient °

## 2014-08-03 NOTE — Progress Notes (Signed)
[place]  DOB: 05-01-1955  MR#: [id number]  CSN#: [id number]    PCP: Leonard Downing, MD GYN: SU:  OTHER MD: Theodis Sato  CHIEF COMPLAINT: left breast cancer CURRENT TREATMENT: letrozole daily  BREAST CANCER HISTORY:   Saramarie Stinger is a 59 year old [place] woman referred by Dr. Dalbert Batman for evaluation and treatment of newly diagnosed breast cancer.  Keyna felt fine when she underwent routine mammographic screening June 12, 2009 at [facility].  Dr. Marcelo Baldy noted a potential abnormality on the left breast so on the April 17, 2010 the patient was recalled for diagnostic left mammography and ultrasonography.  He found a persistent 7 mm mass with irregular margins, which by ultrasound measured 7 mm and was irregular and hypoechoic.  A 4 mm benign appearing cyst was incidentally noted anterior to this lesion.  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  With this information the patient was referred to Dr. Dalbert Batman and bilateral breast MRIs were obtained May 01, 2009.  This showed a 1.3 cm enhancing mass in the upper central portion of the left breast associated with the marker clip from the prior biopsy.  There were no other areas of concern in either breast and there were no suspicious internal mammary or axillary lymph nodes.  Accordingly on May 21, 2010 the patient underwent left lumpectomy and axillary lymph node sampling.  The results of this procedure ([id number]) confirmed an invasive ductal carcinoma, grade 2 with clear and ample margins and no evidence of lymphovascular invasion.  Both sentinel lymph nodes were clear.  HER-2/neu was repeated and was again negative.  INTERVAL HISTORY: Anu returns  today for follow up of her breast cancer. She has been on letrozole since May 2012 and is tolerating this drug well. Her hot flashes are controlled with gabapentin, and she denies vaginal changes. She has chronic hand cramps and occasional numbness, secondary to a history of multiple carpal tunnel surgeries. She uses a walking stick to ambulate because of significant leg pain, which her PCP contributes to diabetic neuropathy. Her gabapentin was increased to a total of 3674m daily, but she is not sure she notices a difference while on this medication.  REVIEW OF SYSTEMS:   PSinceritydenies fevers, chills nausea, vomiting, or changes in bowel or bladder habits. She is sure to consume lots of fiber daily to keep regular. She has moderate heartburn managed with ranitidine daily. She has shortness of breath with exertion, but denies chest pain, cough, or palpitations. She has insomnia a few nights a week. Her blood sugars are controlled relatively well. She denies headaches or dizziness, but does have vision changes in the form of cataracts. A detailed review of systems is otherwise stable.  PAST MEDICAL HISTORY:     Past Medical History  Diagnosis Date  . Hyperlipidemia   . GERD (gastroesophageal reflux disease)   . S/P radiation therapy 07/04/10 - 08/11/10    5040 cGy/28 Treatments to Left Breast  . Arthritis     knees  . Carpal tunnel syndrome of right wrist 08/2012  . Lesion of right ulnar nerve 08/2012  . History of breast cancer 04/2010    left  . Diabetes mellitus type 2, noninsulin dependent   . Hypertension  under control with med., has been on med. x 6-7 yr.  . Wears partial dentures     lower  . Wears dentures     upper  60 P/Y tobacco abuse, discontinued JAN 2012 Diabetes mellitus Remote migraines osteoarthritis  PAST SURGICAL HISTORY:      Past Surgical History  Procedure Laterality Date  . Cholecystectomy  09/16/2005  . Knee arthroscopy Left 08/11/2002  . Partial mastectomy  with needle localization and axillary sentinel lymph node bx Left 05/21/2010    with re-exc. medial margin lumpectomy site  . Robotic assisted total hysterectomy with bilateral salpingo oopherectomy  09/17/2010  . Robotic assisted laparoscopic lysis of adhesion  09/17/2010  . Carpal tunnel release Right 09/21/2012    Procedure: CARPAL TUNNEL RELEASE;  Surgeon: Cammie Sickle., MD;  Location: [facility];  Service: Orthopedics;  Laterality: Right;  . Ulnar nerve transposition Right 09/21/2012    Procedure: RIGHT IN SITU DECOMPRESSION ULNAR NERVE;  Surgeon: Cammie Sickle., MD;  Location: [place];  Service: Orthopedics;  Laterality: Right;  . Carpal tunnel release Left 11/30/2012    Procedure: CARPAL TUNNEL RELEASE;  Surgeon: Cammie Sickle., MD;  Location: [place];  Service: Orthopedics;  Laterality: Left;  . Shoulder open rotator cuff repair Right 03/29/2013    Procedure: ROTATOR CUFF REPAIR SHOULDER OPEN WITH SUBACROMIAL DECOMPRESSION AND DISTAL CLAVICLE RESECTION;  Surgeon: Cammie Sickle., MD;  Location: [place];  Service: Orthopedics;  Laterality: Right;    FAMILY HISTORY:     Family History  Problem Relation Age of Onset  . Heart disease Mother   . Heart disease Father   . Diabetes Father   The patient's father died at the age of 36 from heart problems.  The patient's mother died at the age of 57 with heart problems.  There is one aunt and one uncle on each side with history of lung cancer.  There is no history of breast or ovarian cancer in the family to her knowledge.  GYNECOLOGIC HISTORY: She is GX, P0, menarche age 73.  Last menstrual period about 10 years ago.  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  Her husband of 59 years Legrand Como is a "househusband."  They have about  four acres and keep chickens, goats, and other animals.  The patient is not a church attender.     ADVANCED DIRECTIVES:Not in place  HEALTH MAINENANCE:       History  Substance Use Topics  . Smoking status: Former Smoker    Quit date: 05/07/2010  . Smokeless tobacco: Never Used  . Alcohol Use: No      Colonoscopy:  Overdue?  PAP: Due now  Bone density: March 2015 at [facility]  Cholesterol: Followed by PCP    ALLERGIES:     Allergies  Allergen Reactions  . Adhesive [Tape] Other (See Comments)    BLISTERS  . Hydrocodone Nausea And Vomiting    MEDICATIONS:      Current Outpatient Prescriptions  Medication Sig Dispense Refill  . gabapentin (NEURONTIN) 600 MG tablet 600 mg 3 (three) times daily. Pt takes( 2) 677m tablets 3 times a day. Total of 3600 mg daily.    .Marland Kitchenletrozole (FEMARA) 2.5 MG tablet TAKE 1 TABLET EVERY DAY 90 tablet 1  .  losartan (COZAAR) 100 MG tablet Take 50 mg by mouth Daily.     . metFORMIN (GLUCOPHAGE) 1000 MG tablet Take 1,000 mg by mouth 2 (two) times daily with a meal. 1034m po QAM and 500 mg PO QHS    . ONE TOUCH ULTRA TEST test strip Daily.    . ranitidine (ZANTAC) 300 MG tablet Take 300 mg by mouth at bedtime.      No current facility-administered medications for this visit.    PHYSICAL EXAM:     Middle-aged white female using a walking stick Filed Vitals:   08/03/14 0842  BP: 143/66  Pulse: 81  Temp: 97.5 F (36.4 C)  Resp: 18     Body mass index is 40.91 kg/(m^2).  ECOG PS: 1  Filed Weights   08/03/14 0842  Weight: 223 lb 11.2 oz (101.47 kg)   Skin: warm, dry  HEENT: sclerae anicteric, conjunctivae pink, oropharynx clear. No thrush or mucositis.  Lymph Nodes: No cervical or supraclavicular lymphadenopathy  Lungs: clear to auscultation bilaterally, no rales, wheezes, or rhonci  Heart: regular rate and rhythm  Abdomen: round, soft, non tender, positive bowel sounds  Musculoskeletal: No focal spinal tenderness, no peripheral  edema  Neuro: non focal, well oriented, positive affect  Breasts: left breast status post lumpectomy and radiation. No evidence of recurrent disease. Left axilla benign. Right breast unremarkable.   LAB RESULTS:    CBC    Component Value Date/Time   WBC 7.5 08/03/2014 0755   WBC 16.9* 09/18/2010 0640   RBC 4.20 08/03/2014 0755   RBC 3.94 09/18/2010 0640   HGB 12.5 08/03/2014 0755   HGB 13.1 03/29/2013 0958   HCT 39.5 08/03/2014 0755   HCT 39.0 11/30/2012 0900   PLT 244 08/03/2014 0755   PLT 216 09/18/2010 0640   MCV 94.0 08/03/2014 0755   MCV 97.5 09/18/2010 0640   MCH 29.8 08/03/2014 0755   MCH 31.0 09/18/2010 0640   MCHC 31.6 08/03/2014 0755   MCHC 31.8 09/18/2010 0640   RDW 14.2 08/03/2014 0755   RDW 13.8 09/18/2010 0640   LYMPHSABS 1.8 08/03/2014 0755   LYMPHSABS 3.6 05/17/2010 1139   MONOABS 0.6 08/03/2014 0755   MONOABS 0.7 05/17/2010 1139   EOSABS 0.4 08/03/2014 0755   EOSABS 0.3 05/17/2010 1139   BASOSABS 0.1 08/03/2014 0755   BASOSABS 0.1 05/17/2010 1139   BMET    Component Value Date/Time   NA 143 08/03/2014 0755   NA 140 03/22/2013 1200   K 4.4 08/03/2014 0755   K 4.8 03/22/2013 1200   CL 103 03/22/2013 1200   CL 104 06/01/2012 0850   CO2 27 08/03/2014 0755   CO2 27 03/22/2013 1200   GLUCOSE 101 08/03/2014 0755   GLUCOSE 79 03/22/2013 1200   GLUCOSE 84 06/01/2012 0850   BUN 11.8 08/03/2014 0755   BUN 20 03/22/2013 1200   CREATININE 0.8 08/03/2014 0755   CREATININE 0.83 03/22/2013 1200   CALCIUM 9.5 08/03/2014 0755   CALCIUM 9.6 03/22/2013 1200   GFRNONAA 76* 03/22/2013 1200   GFRAA 88* 03/22/2013 1200     STUDIES:    [facility]  [facility] showed a t-score of -0.9 (normal).  ASSESSMENT: 59y.o.  [place] woman   (1) status post left lumpectomy and sentinel lymph node sampling in January 2012 for a T1C N0, Stage IA  invasive ductal carcinoma, grade 2,  strongly estrogen and progesterone  receptor positive, HER2/neu negative, with a borderline/low MIB-1.   (2) Oncotype DX score in the low range, predicting an 8% risk of recurrence within 10 years if she took tamoxifen for 5 years.   (3) Status post radiation, completed April 2012.   (4) On letrozole 2.5 mg daily since May 2012 with good tolerance. Bone density March 2016 normal    (5) Status post hysterectomy/bilateral salpingo-oophorectomy in May 2012   PLAN:  Estel continues to do well as far as her breast cancer is concerned. She is now 4 years out from her definitive surgery with no evidence of recurrent disease. She is tolerating the letrozole well and will continue this drug for 1 more year to complete 5 total years of antiestrogen therapy.   Skarlet will return in 1 year for labs and a follow up visit. At this time she will be eligible to "graduate" from further follow up visits. She understands and agrees with this plan. She knows the goal of treatment in her case is cure. She has been encouraged to call with any issues that might arise before her next visit here.  Genelle Gather Emerly Prak 08/03/2014

## 2014-08-04 NOTE — Addendum Note (Signed)
Addended by: Marcelino Duster on: 08/04/2014 10:02 AM   Modules accepted: Orders

## 2014-08-18 ENCOUNTER — Ambulatory Visit (INDEPENDENT_AMBULATORY_CARE_PROVIDER_SITE_OTHER): Payer: 59 | Admitting: Endocrinology

## 2014-08-18 ENCOUNTER — Encounter: Payer: Self-pay | Admitting: Endocrinology

## 2014-08-18 VITALS — BP 134/88 | HR 99 | Temp 98.1°F | Ht 62.0 in | Wt 227.0 lb

## 2014-08-18 DIAGNOSIS — R200 Anesthesia of skin: Secondary | ICD-10-CM

## 2014-08-18 DIAGNOSIS — R252 Cramp and spasm: Secondary | ICD-10-CM | POA: Insufficient documentation

## 2014-08-18 DIAGNOSIS — M792 Neuralgia and neuritis, unspecified: Secondary | ICD-10-CM

## 2014-08-18 LAB — VITAMIN D 25 HYDROXY (VIT D DEFICIENCY, FRACTURES): VITD: 33.37 ng/mL (ref 30.00–100.00)

## 2014-08-18 LAB — VITAMIN B12: Vitamin B-12: 631 pg/mL (ref 211–911)

## 2014-08-18 NOTE — Patient Instructions (Addendum)
good diet and exercise significantly improve the control of your diabetes.  please let me know if you wish to be referred to a dietician.  high blood sugar is very risky to your health.  you should see an eye doctor and dentist every year.  It is very important to get all recommended vaccinations. controlling your blood pressure and cholesterol drastically reduces the damage diabetes does to your body.  Those who smoke should quit.  please discuss these with your doctor.  check your blood sugar once a day.  vary the time of day when you check, between before the 3 meals, and at bedtime.  also check if you have symptoms of your blood sugar being too high or too low.  please keep a record of the readings and bring it to your next appointment here.  You can write it on any piece of paper.  please call us sooner if your blood sugar goes below 70, or if you have a lot of readings over 200.   Please continue the same metformin.  Please come back for a follow-up appointment in 3 months.   blood tests are requested for you today.  We'll let you know about the results. Please consider having weight loss surgery.  It is good for your health.  Here is some information about it.  If you decide to consider further, please call the phone number in the papers, and register for a free informational meeting.   Some specialist believe that taking a high amount of folic acid (4 grams per day, in addition to the gabapentin) helps the neuropathy.  It is worth a try.  Please see a pain specialist.  you will receive a phone call, about a day and time for an appointment

## 2014-08-18 NOTE — Progress Notes (Signed)
Subjective:    Patient ID: [name], female    DOB: 11/20/1955, 59 y.o.   MRN: [id number]  HPI pt states DM was dx'ed in 2006; she has moderate painful neuropathy of the lower extremities; she is unaware of any associated chronic complications; she has never been on insulin; pt says her diet and exercise are good; he has never had GDM, pancreatitis, severe hypoglycemia or DKA.  She has been on metformin x approx 5 years.  She says cbg's in am are in the low-100's.   Past Medical History  Diagnosis Date  . Hyperlipidemia   . GERD (gastroesophageal reflux disease)   . S/P radiation therapy 07/04/10 - 08/11/10    5040 cGy/28 Treatments to Left Breast  . Arthritis     knees  . Carpal tunnel syndrome of right wrist 08/2012  . Lesion of right ulnar nerve 08/2012  . History of breast cancer 04/2010    left  . Diabetes mellitus type 2, noninsulin dependent   . Hypertension     under control with med., has been on med. x 6-7 yr.  . Wears partial dentures     lower  . Wears dentures     upper    Past Surgical History  Procedure Laterality Date  . Cholecystectomy  09/16/2005  . Knee arthroscopy Left 08/11/2002  . Partial mastectomy with needle localization and axillary sentinel lymph node bx Left 05/21/2010    with re-exc. medial margin lumpectomy site  . Robotic assisted total hysterectomy with bilateral salpingo oopherectomy  09/17/2010  . Robotic assisted laparoscopic lysis of adhesion  09/17/2010  . Carpal tunnel release Right 09/21/2012    Procedure: CARPAL TUNNEL RELEASE;  Surgeon: Cammie Sickle., MD;  Location: [place];  Service: Orthopedics;  Laterality: Right;  . Ulnar nerve transposition Right 09/21/2012    Procedure: RIGHT IN SITU DECOMPRESSION ULNAR NERVE;  Surgeon: Cammie Sickle., MD;  Location: [place];  Service: Orthopedics;  Laterality: Right;  . Carpal tunnel release Left 11/30/2012    Procedure: CARPAL TUNNEL RELEASE;  Surgeon:  Cammie Sickle., MD;  Location: [place];  Service: Orthopedics;  Laterality: Left;  . Shoulder open rotator cuff repair Right 03/29/2013    Procedure: ROTATOR CUFF REPAIR SHOULDER OPEN WITH SUBACROMIAL DECOMPRESSION AND DISTAL CLAVICLE RESECTION;  Surgeon: Cammie Sickle., MD;  Location: [place];  Service: Orthopedics;  Laterality: Right;    History   Social History  . Marital Status: Married    Spouse Name: N/A  . Number of Children: N/A  . Years of Education: N/A   Occupational History  . Not on file.   Social History Main Topics  . Smoking status: Former Smoker    Quit date: 05/07/2010  . Smokeless tobacco: Never Used  . Alcohol Use: No  . Drug Use: No  . Sexual Activity: Not on file   Other Topics Concern  . 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 gabapentin (NEURONTIN) 600 MG tablet 600 mg 3 (three) times daily. Pt takes( 2) 600mg  tablets 3 times a day. Total of 3600 mg daily.    Marland Kitchen letrozole (FEMARA) 2.5 MG tablet TAKE 1 TABLET EVERY DAY 90 tablet 1  .  losartan (COZAAR) 100 MG tablet Take 50 mg by mouth Daily.     . metFORMIN (GLUCOPHAGE) 1000 MG tablet Take 1,000 mg by mouth 2 (two) times daily with a meal. 1000mg  po QAM and 500 mg PO QHS    . ONE TOUCH ULTRA TEST test strip Daily.     No current facility-administered medications on file prior to visit.    Allergies  Allergen Reactions  . Adhesive [Tape] Other (See Comments)    BLISTERS  . Hydrocodone Nausea And Vomiting    Family History  Problem Relation Age of Onset  . Heart disease Mother   . Heart disease Father   . Diabetes Father     BP 134/88 mmHg  Pulse 99  Temp(Src) 98.1 F (36.7 C) (Oral)  Ht 5\' 2"  (1.575 m)  Wt 227 lb (102.967 kg)  BMI 41.51 kg/m2  SpO2 95%   Review of  Systems denies weight loss, blurry vision, headache, chest pain, sob, n/v, urinary frequency, excessive diaphoresis, memory loss, cold intolerance, and rhinorrhea.  She has chronic numbness and pain of the feet.  She has nocturnal leg cramps and easy bruising.    Objective:   Physical Exam VS: see vs page GEN: no distress.  Morbid obesity HEAD: head: no deformity.  Severe alopecia. eyes: no periorbital swelling, no proptosis external nose and ears are normal mouth: no lesion seen NECK: supple, thyroid is not enlarged CHEST WALL: no deformity LUNGS:  Clear to auscultation CV: reg rate and rhythm, no murmur ABD: abdomen is soft, nontender.  no hepatosplenomegaly.  not distended.  no hernia MUSCULOSKELETAL: muscle bulk and strength are grossly normal.  no obvious joint swelling.  gait is normal and steady EXTEMITIES: no deformity.  no ulcer on the feet.  feet are of normal color and temp.  1+ bilat leg edema.   PULSES: dorsalis pedis intact bilat.  no carotid bruit.  NEURO:  cn 2-12 grossly intact.   readily moves all 4's.  sensation is intact to touch on the feet, but decreased from normal.  SKIN:  Normal texture and temperature.  No rash or suspicious lesion is visible.   NODES:  None palpable at the neck PSYCH: alert, well-oriented.  Does not appear anxious nor depressed.   outside test results are reviewed: 6.4% i have reviewed outside records: ABI were normal     Assessment & Plan:  DM: well-controlled Neuropathic pain, severe, new Obesity, severe exacerbation.  Patient is advised the following: Patient Instructions  good diet and exercise significantly improve the control of your diabetes.  please let me know if you wish to be referred to a dietician.  high blood sugar is very risky to your health.  you should see an eye doctor and dentist every year.  It is very important to get all recommended vaccinations. controlling your blood pressure and cholesterol drastically reduces  the damage diabetes does to your body.  Those who smoke should quit.  please discuss these with your doctor.  check your blood sugar once a day.  vary the time of day when you check, between before the 3 meals, and at bedtime.  also check if you have symptoms of your blood sugar being too high or too low.  please keep a record of the readings and bring it to your next appointment here.  You can write it on any piece of paper.  please call us sooner if your blood sugar goes below 70, or if you have a lot of  readings over 200.   Please continue the same metformin.  Please come back for a follow-up appointment in 3 months.   blood tests are requested for you today.  We'll let you know about the results. Please consider having weight loss surgery.  It is good for your health.  Here is some information about it.  If you decide to consider further, please call the phone number in the papers, and register for a free informational meeting.   Some specialist believe that taking a high amount of folic acid (4 grams per day, in addition to the gabapentin) helps the neuropathy.  It is worth a try.  Please see a pain specialist.  you will receive a phone call, about a day and time for an appointment

## 2014-08-21 LAB — PTH, INTACT AND CALCIUM
CALCIUM: 9.3 mg/dL (ref 8.4–10.5)
PTH: 36 pg/mL (ref 14–64)

## 2014-11-15 ENCOUNTER — Other Ambulatory Visit: Payer: Self-pay | Admitting: Oncology

## 2014-11-17 ENCOUNTER — Ambulatory Visit (INDEPENDENT_AMBULATORY_CARE_PROVIDER_SITE_OTHER): Payer: 59 | Admitting: Endocrinology

## 2014-11-17 ENCOUNTER — Encounter: Payer: Self-pay | Admitting: Endocrinology

## 2014-11-17 VITALS — BP 134/86 | HR 100 | Ht 62.0 in | Wt 220.0 lb

## 2014-11-17 DIAGNOSIS — E119 Type 2 diabetes mellitus without complications: Secondary | ICD-10-CM | POA: Insufficient documentation

## 2014-11-17 DIAGNOSIS — E1142 Type 2 diabetes mellitus with diabetic polyneuropathy: Secondary | ICD-10-CM

## 2014-11-17 DIAGNOSIS — E118 Type 2 diabetes mellitus with unspecified complications: Secondary | ICD-10-CM | POA: Insufficient documentation

## 2014-11-17 LAB — POCT GLYCOSYLATED HEMOGLOBIN (HGB A1C): Hemoglobin A1C: 6.3

## 2014-11-17 NOTE — Progress Notes (Signed)
Subjective:    Patient ID: [name], female    DOB: [birth year], 59 y.o.   MRN: [id number]  HPI  Pt returns for f/u of diabetes mellitus: DM type: 2 Dx'ed: 0737 Complications: painful polyneuropathy Therapy: metformin GDM: never DKA: never Severe hypoglycemia: never Pancreatitis: never Other: she has never been on insulin Interval history: cbg's are well-controlled Past Medical History  Diagnosis Date  . Hyperlipidemia   . GERD (gastroesophageal reflux disease)   . S/P radiation therapy 07/04/10 - 08/11/10    5040 cGy/28 Treatments to Left Breast  . Arthritis     knees  . Carpal tunnel syndrome of right wrist 08/2012  . Lesion of right ulnar nerve 08/2012  . History of breast cancer 04/2010    left  . Diabetes mellitus type 2, noninsulin dependent   . Hypertension     under control with med., has been on med. x 6-7 yr.  . Wears partial dentures     lower  . Wears dentures     upper    Past Surgical History  Procedure Laterality Date  . Cholecystectomy  09/16/2005  . Knee arthroscopy Left 08/11/2002  . Partial mastectomy with needle localization and axillary sentinel lymph node bx Left 05/21/2010    with re-exc. medial margin lumpectomy site  . Robotic assisted total hysterectomy with bilateral salpingo oopherectomy  09/17/2010  . Robotic assisted laparoscopic lysis of adhesion  09/17/2010  . Carpal tunnel release Right 09/21/2012    Procedure: CARPAL TUNNEL RELEASE;  Surgeon: Cammie Sickle., MD;  Location: [place];  Service: Orthopedics;  Laterality: Right;  . Ulnar nerve transposition Right 09/21/2012    Procedure: RIGHT IN SITU DECOMPRESSION ULNAR NERVE;  Surgeon: Cammie Sickle., MD;  Location: [place];  Service: Orthopedics;  Laterality: Right;  . Carpal tunnel release Left 11/30/2012    Procedure: CARPAL TUNNEL RELEASE;  Surgeon: Cammie Sickle., MD;  Location: [place];  Service: Orthopedics;   Laterality: Left;  . Shoulder open rotator cuff repair Right 03/29/2013    Procedure: ROTATOR CUFF REPAIR SHOULDER OPEN WITH SUBACROMIAL DECOMPRESSION AND DISTAL CLAVICLE RESECTION;  Surgeon: Cammie Sickle., MD;  Location: [place];  Service: Orthopedics;  Laterality: Right;    History   Social History  . Marital Status: Married    Spouse Name: N/A  . Number of Children: N/A  . Years of Education: N/A   Occupational History  . Not on file.   Social History Main Topics  . Smoking status: Former Smoker    Quit date: 05/07/2010  . Smokeless tobacco: Never Used  . Alcohol Use: No  . Drug Use: No  . Sexual Activity: Not on file   Other Topics Concern  . 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 gabapentin (NEURONTIN) 600 MG tablet 600 mg 3 (three) times daily. Pt takes( 2) 600mg  tablets 3 times a day. Total of 3600 mg daily.    Marland Kitchen letrozole (FEMARA) 2.5 MG tablet TAKE 1 TABLET EVERY DAY 90 tablet 1  . losartan (COZAAR) 100 MG tablet Take 50 mg by mouth Daily.     . metFORMIN (GLUCOPHAGE) 1000 MG tablet Take 1,000 mg by mouth 2 (two) times daily with  a meal. 1000mg  po QAM and 500 mg PO QHS    . ONE TOUCH ULTRA TEST test strip Daily.     No current facility-administered medications on file prior to visit.    Allergies  Allergen Reactions  . Adhesive [Tape] Other (See Comments)    BLISTERS  . Hydrocodone Nausea And Vomiting    Family History  Problem Relation Age of Onset  . Heart disease Mother   . Heart disease Father   . Diabetes Father     BP 134/86 mmHg  Pulse 100  Ht 5\' 2"  (1.575 m)  Wt 220 lb (99.791 kg)  BMI 40.23 kg/m2  SpO2 98%  Review of Systems Denies weight change    Objective:   Physical Exam VITAL SIGNS:  See vs page GENERAL: no distress Pulses: dorsalis  pedis intact bilat.   MSK: no deformity of the feet CV: 1+ bilat leg edema. Skin:  no ulcer on the feet.  normal color and temp on the feet. Neuro: sensation is intact to touch on the feet, but decreased from normal.   A1c=6.3%     Assessment & Plan:  DM: well-controlled    Patient is advised the following: Patient Instructions  check your blood sugar once a day.  vary the time of day when you check, between before the 3 meals, and at bedtime.  also check if you have symptoms of your blood sugar being too high or too low.  please keep a record of the readings and bring it to your next appointment here.  You can write it on any piece of paper.  please call us sooner if your blood sugar goes below 70, or if you have a lot of readings over 200.   Please continue the same metformin.  Please see a dietician specialist.   Please come back for a follow-up appointment in 3-6 months.

## 2014-11-17 NOTE — Patient Instructions (Addendum)
check your blood sugar once a day.  vary the time of day when you check, between before the 3 meals, and at bedtime.  also check if you have symptoms of your blood sugar being too high or too low.  please keep a record of the readings and bring it to your next appointment here.  You can write it on any piece of paper.  please call us sooner if your blood sugar goes below 70, or if you have a lot of readings over 200.   Please continue the same metformin.  Please see a dietician specialist.   Please come back for a follow-up appointment in 3-6 months.

## 2014-11-24 ENCOUNTER — Encounter: Payer: 59 | Attending: Endocrinology | Admitting: Dietician

## 2014-11-24 ENCOUNTER — Encounter: Payer: Self-pay | Admitting: Dietician

## 2014-11-24 VITALS — Ht 62.0 in | Wt 222.0 lb

## 2014-11-24 DIAGNOSIS — Z713 Dietary counseling and surveillance: Secondary | ICD-10-CM | POA: Diagnosis not present

## 2014-11-24 DIAGNOSIS — E1142 Type 2 diabetes mellitus with diabetic polyneuropathy: Secondary | ICD-10-CM | POA: Diagnosis present

## 2014-11-24 NOTE — Patient Instructions (Signed)
Keep up the exercise every morning.  This is a great habit! Your meal choices are very healthy.   Small amount of protein with each meal and snack (1/2 cup pintoes, 1 oz cheese or walnuts, 1 egg, yogurt as examples) Avoid eating in front of the TV. Aim for 3-4 Carb Choices per meal (45-60 grams) +/- 1 either way  Aim for 0-30 Carbs per snack if hungry.  Before eating a snack ask, "Am I hungry or bored?" Avoid or limit the amount of apple cider.

## 2014-11-24 NOTE — Progress Notes (Signed)
Diabetes Self-Management Education  Visit Type: First/Initial  Appt. Start Time: 0800 Appt. End Time: 0930  11/24/2014  [name], identified by name and date of birth, is a 59 y.o. female with a diagnosis of Diabetes: Type 2 (for about 10 years).  Other people present during visit:  Patient  Other hx includes hyperlipidemia, GERD, HTN.  Weight loss surgery discussed with patient at her last MD visit but patient would try to lose weight on her own.    Patient lives with her husband.  Both cook and shop.  Husband has lost 100 lbs in the past year due to dietary changes (reducing fat) and pancreatitis.  Both are retired.  She walks 1 1/2 miles every morning while it is cool and enjoys gardening and painting.  HgbA1C 11/17/14 of 6.3%.  Vitamin D 33 (08/18/14) which is low end of normal.  She is not taking a supplement at this time.  Discussed the benefit closer to winter.  She has been reading the food labels for sugar and fat and has cut all of the sugar out of her diet and uses Splenda instead.  She has lost from 240 lbs 1 year ago to 222 lbs today but is frustrated that she has not lost the same amount of weight as her husband despite similar eating habits.  She is concerned about the fluid in her ankles/feet.  Discussed reducing added salt and processed foods.  Upon evaluation of diet hx, patient often eats upwards of 190 grams of Carbohydrates and >1000 calories often with her bedtime snacks.  Meals are overall balanced and healthy but recommend that she eat a little more earlier in the day (adding protein to lunch and breakfast) to avoid craving later in the day.   ASSESSMENT  Height 5\' 2"  (1.575 m), weight 222 lb (100.699 kg). Body mass index is 40.59 kg/(m^2).  Initial Visit Information:  Are you currently following a meal plan?: No   Are you taking your medications as prescribed?: Yes Are you checking your feet?: Yes   How often do you need to have someone help you when you read  instructions, pamphlets, or other written materials from your doctor or pharmacy?: 2 - Rarely What is the last grade level you completed in school?: 12th grade HS  Psychosocial:     Patient Belief/Attitude about Diabetes: Motivated to manage diabetes Self-care barriers: None Self-management support: Doctor's office, Family Other persons present: Patient Patient Concerns: Nutrition/Meal planning, Healthy Lifestyle, Weight Control Special Needs: None Preferred Learning Style: No preference indicated Learning Readiness: Ready  Complications:   Last HgB A1C per patient/outside source: 6.3 mg/dL (10/28/14) How often do you check your blood sugar?: 1-2 times/day Fasting Blood glucose range (mg/dL): 130-179 Postprandial Blood glucose range (mg/dL): 70-129 Number of hypoglycemic episodes per month: 0 Number of hyperglycemic episodes per week: 0 Have you had a dilated eye exam in the past 12 months?: Yes Have you had a dental exam in the past 12 months?: Yes  Diet Intake:  Breakfast: low sugar instant oatmeal, banana or yogurt Snack (morning): none Lunch: sandwich (tomato and mayo on [employer] bread), LF potato chips at times, occasional apple or banana Snack (afternoon): occasional yogurt or handful of walnuts Dinner: baked fish or other meat, slaw, hush puppies, 1/2 baked potato with light margarine Snack (evening): fruit OR 1 1/2 cups low carb ice cream and 2 1/2 cups Honey bunches of oats with added walnuts and almond milk Beverage(s): water, black coffee, almond milk, diet  gingerale occasionally, sugar free apple cider1 cup each night  Exercise:  Exercise: Light (walking / raking leaves) (walks 1 1/2 miles before breakfast, and exercises recommended by PT) Light Exercise amount of time (min / week): 150  Individualized Plan for Diabetes Self-Management Training:   Learning Objective:  Patient will have a greater understanding of diabetes self-management.  Patient education plan per  assessed needs and concerns is to attend individual sessions for     Education Topics Reviewed with Patient Today:  Definition of diabetes, type 1 and 2, and the diagnosis of diabetes Role of diet in the treatment of diabetes and the relationship between the three main macronutrients and blood glucose level, Food label reading, portion sizes and measuring food., Carbohydrate counting, Meal options for control of blood glucose level and chronic complications. Role of exercise on diabetes management, blood pressure control and cardiac health. Reviewed patients medication for diabetes, action, purpose, timing of dose and side effects. Daily foot exams, Yearly dilated eye exam   Relationship between chronic complications and blood glucose control, Retinopathy and reason for yearly dilated eye exams Identified and addressed patients feelings and concerns about diabetes   Lifestyle issues that need to be addressed for better diabetes care  PATIENTS GOALS/Plan (Developed by the patient):  Nutrition: General guidelines for healthy choices and portions discussed, Follow meal plan discussed Physical Activity: Exercise 5-7 days per week, 30 minutes per day Medications: take my medication as prescribed Monitoring : test my blood glucose as discussed (note x per day with comment) (per MD recs) Reducing Risk: do foot checks daily  Plan:   Patient Instructions  Keep up the exercise every morning.  This is a great habit! Your meal choices are very healthy.   Small amount of protein with each meal and snack (1/2 cup pintoes, 1 oz cheese or walnuts, 1 egg, yogurt as examples) Avoid eating in front of the TV. Aim for 3-4 Carb Choices per meal (45-60 grams) +/- 1 either way  Aim for 0-30 Carbs per snack if hungry.  Before eating a snack ask, "Am I hungry or bored?" Avoid or limit the amount of apple cider.   Expected Outcomes:  Demonstrated interest in learning. Expect positive outcomes  Education  material provided: Living Well with Diabetes, Food label handouts, Meal plan card, My Plate and Snack sheet  If problems or questions, patient to contact team via:  Phone  Future DSME appointment: 4-6 wks

## 2015-01-05 ENCOUNTER — Ambulatory Visit: Payer: 59 | Admitting: Dietician

## 2015-04-13 ENCOUNTER — Encounter: Payer: Self-pay | Admitting: Endocrinology

## 2015-04-13 ENCOUNTER — Ambulatory Visit (INDEPENDENT_AMBULATORY_CARE_PROVIDER_SITE_OTHER): Payer: 59 | Admitting: Endocrinology

## 2015-04-13 VITALS — BP 140/87 | HR 86 | Temp 97.7°F | Ht 62.0 in | Wt 217.0 lb

## 2015-04-13 DIAGNOSIS — E1142 Type 2 diabetes mellitus with diabetic polyneuropathy: Secondary | ICD-10-CM | POA: Diagnosis not present

## 2015-04-13 LAB — MICROALBUMIN / CREATININE URINE RATIO
Creatinine,U: 19.2 mg/dL
Microalb Creat Ratio: 3.6 mg/g (ref 0.0–30.0)
Microalb, Ur: <0.7 mg/dL (ref 0.0–1.9)

## 2015-04-13 LAB — POCT GLYCOSYLATED HEMOGLOBIN (HGB A1C): HEMOGLOBIN A1C: 5.9

## 2015-04-13 NOTE — Progress Notes (Signed)
Subjective:    Patient ID: [name], female    DOB: May 19, 1955, 59 y.o.   MRN: [id number]  HPI Pt returns for f/u of diabetes mellitus: DM type: 2 Dx'ed: 123456 Complications: painful polyneuropathy Therapy: metformin GDM: never DKA: never Severe hypoglycemia: never Pancreatitis: never Other: she has never been on insulin.  Interval history: cbg's are well-controlled.  pt states she feels well in general, except for chronic pain of the feet.  Past Medical History  Diagnosis Date  . Hyperlipidemia   . GERD (gastroesophageal reflux disease)   . S/P radiation therapy 07/04/10 - 08/11/10    5040 cGy/28 Treatments to Left Breast  . Arthritis     knees  . Carpal tunnel syndrome of right wrist 08/2012  . Lesion of right ulnar nerve 08/2012  . History of breast cancer 04/2010    left  . Diabetes mellitus type 2, noninsulin dependent ([place])   . Hypertension     under control with med., has been on med. x 6-7 yr.  . Wears partial dentures     lower  . Wears dentures     upper    Past Surgical History  Procedure Laterality Date  . Cholecystectomy  09/16/2005  . Knee arthroscopy Left 08/11/2002  . Partial mastectomy with needle localization and axillary sentinel lymph node bx Left 05/21/2010    with re-exc. medial margin lumpectomy site  . Robotic assisted total hysterectomy with bilateral salpingo oopherectomy  09/17/2010  . Robotic assisted laparoscopic lysis of adhesion  09/17/2010  . Carpal tunnel release Right 09/21/2012    Procedure: CARPAL TUNNEL RELEASE;  Surgeon: Cammie Sickle., MD;  Location: [place];  Service: Orthopedics;  Laterality: Right;  . Ulnar nerve transposition Right 09/21/2012    Procedure: RIGHT IN SITU DECOMPRESSION ULNAR NERVE;  Surgeon: Cammie Sickle., MD;  Location: [place];  Service: Orthopedics;  Laterality: Right;  . Carpal tunnel release Left 11/30/2012    Procedure: CARPAL TUNNEL RELEASE;  Surgeon: Cammie Sickle., MD;  Location: [place];  Service: Orthopedics;  Laterality: Left;  . Shoulder open rotator cuff repair Right 03/29/2013    Procedure: ROTATOR CUFF REPAIR SHOULDER OPEN WITH SUBACROMIAL DECOMPRESSION AND DISTAL CLAVICLE RESECTION;  Surgeon: Cammie Sickle., MD;  Location: [place];  Service: Orthopedics;  Laterality: Right;    Social History   Social History  . Marital Status: Married    Spouse Name: N/A  . Number of Children: N/A  . Years of Education: N/A   Occupational History  . Not on file.   Social History Main Topics  . Smoking status: Former Smoker    Quit date: 05/07/2010  . Smokeless tobacco: Never Used  . Alcohol Use: No  . Drug Use: No  . Sexual Activity: Not on file   Other Topics Concern  . 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 gabapentin (NEURONTIN) 600 MG tablet 1,200 mg daily. Pt takes( 2) 600mg  tablets 3 times a day. Total of 3600 mg daily.    Marland Kitchen letrozole (FEMARA) 2.5 MG tablet TAKE 1 TABLET EVERY DAY 90 tablet 1  . losartan (COZAAR) 100 MG tablet Take 50 mg by mouth Daily.     Marland Kitchen  metFORMIN (GLUCOPHAGE) 1000 MG tablet Take 1,000 mg by mouth 2 (two) times daily with a meal. 1000mg  po QAM and 500 mg PO QHS    . ONE TOUCH ULTRA TEST test strip Daily.    . ranitidine (ZANTAC) 300 MG capsule Take 300 mg by mouth every evening.     No current facility-administered medications on file prior to visit.    Allergies  Allergen Reactions  . Adhesive [Tape] Other (See Comments)    BLISTERS  . Hydrocodone Nausea And Vomiting    Family History  Problem Relation Age of Onset  . Heart disease Mother   . Heart disease Father   . Diabetes Father     BP 140/87 mmHg  Pulse 86  Temp(Src) 97.7 F (36.5 C) (Oral)  Ht 5\' 2"  (1.575 m)  Wt 217 lb  (98.431 kg)  BMI 39.68 kg/m2  SpO2 97%  Review of Systems No weight change.     Objective:   Physical Exam VITAL SIGNS:  See vs page GENERAL: no distress Pulses: dorsalis pedis intact bilat.   MSK: no deformity of the feet CV: trace bilat leg edema. Skin:  no ulcer on the feet.  normal color and temp on the feet. Neuro: sensation is intact to touch on the feet, but decreased from normal.    A1c=5.9%    Assessment & Plan:  DM: well-controlled HTN: needs recheck.    Patient is advised the following: Patient Instructions  check your blood sugar once a day.  vary the time of day when you check, between before the 3 meals, and at bedtime.  also check if you have symptoms of your blood sugar being too high or too low.  please keep a record of the readings and bring it to your next appointment here.  You can write it on any piece of paper.  please call us sooner if your blood sugar goes below 70, or if you have a lot of readings over 200.   Please continue the same metformin.  A diabetes urine test is requested for you today.  We'll let you know about the results. Please continue to see Dr Arelia Sneddon to recheck your blood pressure.   Please come back for a follow-up appointment in 6 months.

## 2015-04-13 NOTE — Patient Instructions (Addendum)
check your blood sugar once a day.  vary the time of day when you check, between before the 3 meals, and at bedtime.  also check if you have symptoms of your blood sugar being too high or too low.  please keep a record of the readings and bring it to your next appointment here.  You can write it on any piece of paper.  please call us sooner if your blood sugar goes below 70, or if you have a lot of readings over 200.   Please continue the same metformin.  A diabetes urine test is requested for you today.  We'll let you know about the results. Please continue to see Dr Arelia Sneddon to recheck your blood pressure.   Please come back for a follow-up appointment in 6 months.

## 2015-04-29 HISTORY — PX: CATARACT EXTRACTION: SUR2

## 2015-04-29 HISTORY — PX: COLONOSCOPY: SHX174

## 2015-05-10 LAB — HM DIABETES EYE EXAM

## 2015-05-23 ENCOUNTER — Other Ambulatory Visit: Payer: Self-pay | Admitting: *Deleted

## 2015-05-23 MED ORDER — LETROZOLE 2.5 MG PO TABS: 2.5000 mg | ORAL_TABLET | Freq: Every day | ORAL | Status: DC

## 2015-08-02 ENCOUNTER — Other Ambulatory Visit (HOSPITAL_BASED_OUTPATIENT_CLINIC_OR_DEPARTMENT_OTHER): Payer: 59

## 2015-08-02 ENCOUNTER — Ambulatory Visit (HOSPITAL_BASED_OUTPATIENT_CLINIC_OR_DEPARTMENT_OTHER): Payer: 59 | Admitting: Oncology

## 2015-08-02 VITALS — BP 130/56 | HR 80 | Temp 97.8°F | Resp 18 | Ht 62.0 in | Wt 212.2 lb

## 2015-08-02 DIAGNOSIS — Z170 Estrogen receptor positive status [ER+]: Secondary | ICD-10-CM | POA: Diagnosis not present

## 2015-08-02 DIAGNOSIS — C50112 Malignant neoplasm of central portion of left female breast: Secondary | ICD-10-CM

## 2015-08-02 DIAGNOSIS — C50412 Malignant neoplasm of upper-outer quadrant of left female breast: Secondary | ICD-10-CM

## 2015-08-02 DIAGNOSIS — Z79811 Long term (current) use of aromatase inhibitors: Secondary | ICD-10-CM

## 2015-08-02 LAB — CBC WITH DIFFERENTIAL/PLATELET
BASO%: 0.9 % (ref 0.0–2.0)
BASOS ABS: 0.1 10*3/uL (ref 0.0–0.1)
EOS ABS: 0.3 10*3/uL (ref 0.0–0.5)
EOS%: 3.5 % (ref 0.0–7.0)
HEMATOCRIT: 39.2 % (ref 34.8–46.6)
HGB: 12.5 g/dL (ref 11.6–15.9)
LYMPH#: 2.3 10*3/uL (ref 0.9–3.3)
LYMPH%: 26.1 % (ref 14.0–49.7)
MCH: 28.8 pg (ref 25.1–34.0)
MCHC: 31.8 g/dL (ref 31.5–36.0)
MCV: 90.7 fL (ref 79.5–101.0)
MONO#: 0.5 10*3/uL (ref 0.1–0.9)
MONO%: 6 % (ref 0.0–14.0)
NEUT#: 5.6 10*3/uL (ref 1.5–6.5)
NEUT%: 63.5 % (ref 38.4–76.8)
Platelets: 235 10*3/uL (ref 145–400)
RBC: 4.33 10*6/uL (ref 3.70–5.45)
RDW: 13.6 % (ref 11.2–14.5)
WBC: 8.8 10*3/uL (ref 3.9–10.3)

## 2015-08-02 LAB — COMPREHENSIVE METABOLIC PANEL
ALT: 25 U/L (ref 0–55)
AST: 19 U/L (ref 5–34)
Albumin: 3.7 g/dL (ref 3.5–5.0)
Alkaline Phosphatase: 126 U/L (ref 40–150)
Anion Gap: 8 mEq/L (ref 3–11)
BUN: 16.4 mg/dL (ref 7.0–26.0)
CHLORIDE: 107 meq/L (ref 98–109)
CO2: 27 mEq/L (ref 22–29)
Calcium: 9.3 mg/dL (ref 8.4–10.4)
Creatinine: 0.9 mg/dL (ref 0.6–1.1)
EGFR: 72 mL/min/{1.73_m2} — ABNORMAL LOW (ref >90)
GLUCOSE: 93 mg/dL (ref 70–140)
POTASSIUM: 4.7 meq/L (ref 3.5–5.1)
SODIUM: 141 meq/L (ref 136–145)
Total Bilirubin: 0.53 mg/dL (ref 0.20–1.20)
Total Protein: 7.2 g/dL (ref 6.4–8.3)

## 2015-08-02 NOTE — Progress Notes (Signed)
[name]  DOB: 1955-08-23  MR#: [id number]  CSN#: [id number]    PCP: Leonard Downing, MD GYN: Sebastian Ache SU:  OTHER MD: Renato Shin  CHIEF COMPLAINT: left breast cancer  CURRENT TREATMENT: Completing 5 years of letrozole  BREAST CANCER HISTORY:   [name] is a 60 year old [place] woman referred by Dr. Dalbert Batman for evaluation and treatment of newly diagnosed breast cancer.  [name] felt fine when she underwent routine mammographic screening June 12, 2009 at [place].  Dr. Marcelo Baldy noted a potential abnormality on the left breast so on the April 17, 2010 the patient was recalled for diagnostic left mammography and ultrasonography.  He found a persistent 7 mm mass with irregular margins, which by ultrasound measured 7 mm and was irregular and hypoechoic.  A 4 mm benign appearing cyst was incidentally noted anterior to this lesion.  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  With this information the patient was referred to Dr. Dalbert Batman and bilateral breast MRIs were obtained May 01, 2009.  This showed a 1.3 cm enhancing mass in the upper central portion of the left breast associated with the marker clip from the prior biopsy.  There were no other areas of concern in either breast and there were no suspicious internal mammary or axillary lymph nodes.  Accordingly on May 21, 2010 the patient underwent left lumpectomy and axillary lymph node sampling.  The results of this procedure ([id number]) confirmed an invasive ductal carcinoma, grade 2 with clear and ample margins and no evidence of lymphovascular invasion.  Both sentinel lymph nodes were clear.  HER-2/neu was repeated and was again  negative.  Her subsequent history is as detailed below  INTERVAL HISTORY:  [name] returns today for follow-up of her breast cancer. She continues on letrozole, generally with good tolerance. She does have some flashes, and still has some vaginal dryness problems. She uses a lubricant for that. She tried black cohosh for the hot flashes and it worked initially but now is no longer working. Otherwise she tolerates it well and has been able to obtain it at a reasonable price.  REVIEW OF SYSTEMS:   [name] tells me she has developed cataracts and needs to have surgery, which is scheduled for the next couple of months. She also has worsening neuropathy in her feet. Dr. Arelia Sneddon has increased her gabapentin and that is helping. She has been offered bariatric surgery but declined it. She walks maybe 2 miles a day and is trying to change her diet to bring her diabetes under better control. She sleeps on 2 pillows, has some joint pain here and there which is not more persistent or intense than before, but otherwise a detailed review of systems today was stable.  PAST MEDICAL HISTORY:     Past Medical History  Diagnosis Date  . Hyperlipidemia   . GERD (gastroesophageal reflux disease)   . S/P radiation therapy 07/04/10 - 08/11/10    5040 cGy/28 Treatments to Left Breast  . Arthritis     knees  . Carpal tunnel syndrome of right wrist 08/2012  . Lesion of right ulnar nerve 08/2012  . History of breast cancer 04/2010    left  . Diabetes mellitus type 2, noninsulin dependent ([place])   .  Hypertension     under control with med., has been on med. x 6-7 yr.  . Wears partial dentures     lower  . Wears dentures     upper  60 P/Y tobacco abuse, discontinued JAN 2012 Diabetes mellitus Remote migraines osteoarthritis  PAST SURGICAL HISTORY:      Past Surgical History  Procedure Laterality Date  . Cholecystectomy  09/16/2005  . Knee arthroscopy Left 08/11/2002  . Partial mastectomy with needle localization  and axillary sentinel lymph node bx Left 05/21/2010    with re-exc. medial margin lumpectomy site  . Robotic assisted total hysterectomy with bilateral salpingo oopherectomy  09/17/2010  . Robotic assisted laparoscopic lysis of adhesion  09/17/2010  . Carpal tunnel release Right 09/21/2012    Procedure: CARPAL TUNNEL RELEASE;  Surgeon: Cammie Sickle., MD;  Location: [place];  Service: Orthopedics;  Laterality: Right;  . Ulnar nerve transposition Right 09/21/2012    Procedure: RIGHT IN SITU DECOMPRESSION ULNAR NERVE;  Surgeon: Cammie Sickle., MD;  Location: [place];  Service: Orthopedics;  Laterality: Right;  . Carpal tunnel release Left 11/30/2012    Procedure: CARPAL TUNNEL RELEASE;  Surgeon: Cammie Sickle., MD;  Location: [place];  Service: Orthopedics;  Laterality: Left;  . Shoulder open rotator cuff repair Right 03/29/2013    Procedure: ROTATOR CUFF REPAIR SHOULDER OPEN WITH SUBACROMIAL DECOMPRESSION AND DISTAL CLAVICLE RESECTION;  Surgeon: Cammie Sickle., MD;  Location: [place];  Service: Orthopedics;  Laterality: Right;    FAMILY HISTORY:     Family History  Problem Relation Age of Onset  . Heart disease Mother   . Heart disease Father   . Diabetes Father   The patient's father died at the age of 4 from heart problems.  The patient's mother died at the age of 88 with heart problems.  There is one aunt and one uncle on each side with history of lung cancer.  There is no history of breast or ovarian cancer in the family to her knowledge.  GYNECOLOGIC HISTORY: She is GX, P0, menarche age 44.  Last menstrual period about 10 years ago.  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  Her husband of 70 years [name] is a "househusband."  They have about four acres and keep  chickens, goats, and other animals.  The patient is not a church attender.      ADVANCED DIRECTIVES:Not in place  [place]:       Social History  Substance Use Topics  . Smoking status: Former Smoker    Quit date: 05/07/2010  . Smokeless tobacco: Never Used  . Alcohol Use: No      Colonoscopy:  Overdue?  PAP: Due now  Bone density: March 2015 at [facility]  Cholesterol: Followed by PCP    ALLERGIES:     Allergies  Allergen Reactions  . Adhesive [Tape] Other (See Comments)    BLISTERS  . Hydrocodone Nausea And Vomiting    MEDICATIONS:      Current Outpatient Prescriptions  Medication Sig Dispense Refill  . Black Cohosh 40 MG CAPS Take by mouth.    . Cinnamon 500 MG TABS Take by mouth.    . gabapentin (NEURONTIN) 600 MG tablet 1,200 mg daily. Pt takes( 2) 615m tablets 3  times a day. Total of 3600 mg daily.    Marland Kitchen letrozole (FEMARA) 2.5 MG tablet Take 1 tablet (2.5 mg total) by mouth daily. 90 tablet 0  . losartan (COZAAR) 100 MG tablet Take 50 mg by mouth Daily.     Marland Kitchen lovastatin (MEVACOR) 10 MG tablet Take 10 mg by mouth at bedtime.    . metFORMIN (GLUCOPHAGE) 1000 MG tablet Take 1,000 mg by mouth 2 (two) times daily with a meal. 1024m po QAM and 500 mg PO QHS    . ONE TOUCH ULTRA TEST test strip Daily.    . ranitidine (ZANTAC) 300 MG capsule Take 300 mg by mouth every evening.    . TURMERIC PO Take by mouth.     No current facility-administered medications for this visit.    PHYSICAL EXAM:     Middle-aged white Woman who has an impressive walking staff Filed Vitals:   08/02/15 1048  BP: 130/56  Pulse: 80  Temp: 97.8 F (36.6 C)  Resp: 18     Body mass index is 38.8 kg/(m^2).  ECOG PS: 1  Filed Weights   08/02/15 1048  Weight: 212 lb 3.2 oz (96.253 kg)   Sclerae unicteric, EOMs intact Oropharynx clear, dentition in good repair No cervical or supraclavicular adenopathy Lungs no rales or rhonchi Heart regular rate and rhythm Abd soft, obese,  nontender, positive bowel sounds MSK no focal spinal tenderness, no upper extremity lymphedema Neuro: nonfocal, well oriented, appropriate affect Breasts: The right breast is unremarkable. The left breast is status post lumpectomy and radiation. There is no evidence of local recurrence. The left axilla is benign    LAB RESULTS:    CBC    Component Value Date/Time   WBC 8.8 08/02/2015 1035   WBC 16.9* 09/18/2010 0640   RBC 4.33 08/02/2015 1035   RBC 3.94 09/18/2010 0640   HGB 12.5 08/02/2015 1035   HGB 13.1 03/29/2013 0958   HCT 39.2 08/02/2015 1035   HCT 39.0 11/30/2012 0900   PLT 235 08/02/2015 1035   PLT 216 09/18/2010 0640   MCV 90.7 08/02/2015 1035   MCV 97.5 09/18/2010 0640   MCH 28.8 08/02/2015 1035   MCH 31.0 09/18/2010 0640   MCHC 31.8 08/02/2015 1035   MCHC 31.8 09/18/2010 0640   RDW 13.6 08/02/2015 1035   RDW 13.8 09/18/2010 0640   LYMPHSABS 2.3 08/02/2015 1035   LYMPHSABS 3.6 05/17/2010 1139   MONOABS 0.5 08/02/2015 1035   MONOABS 0.7 05/17/2010 1139   EOSABS 0.3 08/02/2015 1035   EOSABS 0.3 05/17/2010 1139   BASOSABS 0.1 08/02/2015 1035   BASOSABS 0.1 05/17/2010 1139   BMET    Component Value Date/Time   NA 143 08/03/2014 0755   NA 140 03/22/2013 1200   K 4.4 08/03/2014 0755   K 4.8 03/22/2013 1200   CL 103 03/22/2013 1200   CL 104 06/01/2012 0850   CO2 27 08/03/2014 0755   CO2 27 03/22/2013 1200   GLUCOSE 101 08/03/2014 0755   GLUCOSE 79 03/22/2013 1200   GLUCOSE 84 06/01/2012 0850   BUN 11.8 08/03/2014 0755   BUN 20 03/22/2013 1200   CREATININE 0.8 08/03/2014 0755   CREATININE 0.83 03/22/2013 1200   CALCIUM 9.3 08/18/2014 1441   CALCIUM 9.5 08/03/2014 0755   GFRNONAA 76* 03/22/2013 1200   GFRAA 88* 03/22/2013 1200     STUDIES:    [facility]  [facility]  a t-score of -0.9 (normal).  ASSESSMENT: 60 y.o.  [name] woman   (1) status post left lumpectomy and  sentinel lymph node sampling in January 2012 for a T1C N0, Stage IA  invasive ductal carcinoma, grade 2, strongly estrogen and progesterone receptor positive, HER2/neu negative, with a borderline/low MIB-1.   (2) Oncotype DX score in the low range, predicting an 8% risk of recurrence within 10 years if she took tamoxifen for 5 years.   (3) Status post radiation, completed April 2012.   (4) On letrozole 2.5 mg daily since May 2012 with good tolerance. Bone density March 2016 normal    (5) Status post hysterectomy/bilateral salpingo-oophorectomy in May 2012   PLAN:  [name] will complete 5 years on letrozole when she runs out of her current supply. We have data that continuing letrozole an additional 5 years does reduce the risk of recurrence, but the risk of systemic recurrence is reduced by only 1%. Since letrozole can have other side effects including worsening osteopenia and osteoporosis problems I am comfortable with [name] stopping at this point.  Given her original Oncotype results and the fact that she took letrozole instead of tamoxifen, with letrozole further reducing the risk of recurrence, I would calculate she has a 97 or 98% chance of this breast cancer not recurring within the next 5 years.  As far as breast cancer follow-up is concerned all she will need is her yearly mammography and a yearly physician breast exam.  I will be glad to see [name] again at any point in the future if the need arises, but as of now were making no further routine appointment for her here.  MAGRINAT,GUSTAV C 08/02/2015

## 2015-08-16 ENCOUNTER — Telehealth: Payer: Self-pay

## 2015-08-16 NOTE — Telephone Encounter (Signed)
Per Dr. Virgie Dad note, pt to discontinue letrozole when her current supply depleted.  Refill for letrozole declined - fax sent to [facility] and sent to scan.

## 2015-08-23 ENCOUNTER — Encounter: Payer: Self-pay | Admitting: Gastroenterology

## 2015-10-12 ENCOUNTER — Ambulatory Visit: Payer: 59 | Admitting: Endocrinology

## 2015-12-18 ENCOUNTER — Ambulatory Visit (AMBULATORY_SURGERY_CENTER): Payer: Self-pay

## 2015-12-18 VITALS — Ht 60.0 in | Wt 209.6 lb

## 2015-12-18 DIAGNOSIS — Z800 Family history of malignant neoplasm of digestive organs: Secondary | ICD-10-CM

## 2015-12-18 MED ORDER — NA SULFATE-K SULFATE-MG SULF 17.5-3.13-1.6 GM/177ML PO SOLN: ORAL | 0 refills | Status: DC

## 2015-12-18 NOTE — Progress Notes (Signed)
Per pt, no allergies to soy or egg products.Pt not taking any weight loss meds or using  O2 at home. 

## 2015-12-20 ENCOUNTER — Encounter: Payer: Self-pay | Admitting: Gastroenterology

## 2016-01-01 ENCOUNTER — Encounter: Payer: Self-pay | Admitting: Gastroenterology

## 2016-01-01 ENCOUNTER — Ambulatory Visit (AMBULATORY_SURGERY_CENTER): Payer: 59 | Admitting: Gastroenterology

## 2016-01-01 VITALS — BP 138/69 | HR 94 | Temp 97.5°F | Resp 13 | Ht 60.0 in | Wt 209.0 lb

## 2016-01-01 DIAGNOSIS — Z1211 Encounter for screening for malignant neoplasm of colon: Secondary | ICD-10-CM | POA: Diagnosis present

## 2016-01-01 DIAGNOSIS — Z800 Family history of malignant neoplasm of digestive organs: Secondary | ICD-10-CM | POA: Diagnosis not present

## 2016-01-01 LAB — GLUCOSE, CAPILLARY
GLUCOSE-CAPILLARY: 78 mg/dL (ref 65–99)
Glucose-Capillary: 86 mg/dL (ref 65–99)

## 2016-01-01 MED ORDER — SODIUM CHLORIDE 0.9 % IV SOLN: 500.0000 mL | INTRAVENOUS | Status: DC

## 2016-01-01 NOTE — Op Note (Signed)
[place] Patient Name: [name] Procedure Date: 01/01/2016 8:18 AM MRN: [id number] Endoscopist: Remo Lipps P. Havery Moros , MD Age: 60 Referring MD:  Date of Birth: 04/21/1956 Gender: Female Account #: [id number] Procedure:                Colonoscopy Indications:              Screening in patient at increased risk: sister with                            colorectal cancer at age 47s Medicines:                Monitored Anesthesia Care Procedure:                Pre-Anesthesia Assessment:                           - Prior to the procedure, a History and Physical                            was performed, and patient medications and                            allergies were reviewed. The patient's tolerance of                            previous anesthesia was also reviewed. The risks                            and benefits of the procedure and the sedation                            options and risks were discussed with the patient.                            All questions were answered, and informed consent                            was obtained. Prior Anticoagulants: The patient has                            taken aspirin, last dose was 1 day prior to                            procedure. ASA Grade Assessment: II - A patient                            with mild systemic disease. After reviewing the                            risks and benefits, the patient was deemed in                            satisfactory condition to undergo the procedure.  After obtaining informed consent, the colonoscope                            was passed under direct vision. Throughout the                            procedure, the patient's blood pressure, pulse, and                            oxygen saturations were monitored continuously. The                            Model CF-HQ190L [id number]) scope was introduced                            through the anus and  advanced to the the cecum,                            identified by appendiceal orifice and ileocecal                            valve. The colonoscopy was performed without                            difficulty. The patient tolerated the procedure                            well. The quality of the bowel preparation was                            good. The ileocecal valve, appendiceal orifice, and                            rectum were photographed. Scope In: 8:24:54 AM Scope Out: 8:38:31 AM Scope Withdrawal Time: 0 hours 12 minutes 17 seconds  Total Procedure Duration: 0 hours 13 minutes 37 seconds  Findings:                 The perianal and digital rectal examinations were                            normal.                           A few small-mouthed diverticula were found in the                            left colon.                           Non-bleeding internal hemorrhoids were found during                            retroflexion. The hemorrhoids were small.  The exam was otherwise without abnormality. No                            polyps Complications:            No immediate complications. Estimated blood loss:                            None. Estimated Blood Loss:     Estimated blood loss: none. Impression:               - Diverticulosis in the left colon.                           - Non-bleeding internal hemorrhoids.                           - The examination was otherwise normal. Recommendation:           - Patient has a contact number available for                            emergencies. The signs and symptoms of potential                            delayed complications were discussed with the                            patient. Return to normal activities tomorrow.                            Written discharge instructions were provided to the                            patient.                           - Resume previous diet.                            - Continue present medications.                           - Repeat colonoscopy in 5 years for screening                            purposes given strong family history of colon cancer Steven P. Armbruster, MD 01/01/2016 8:43:03 AM This report has been signed electronically.

## 2016-01-01 NOTE — Patient Instructions (Signed)
YOU HAD AN ENDOSCOPIC PROCEDURE TODAY AT [facility]:   Refer to the procedure report that was given to you for any specific questions about what was found during the examination.  If the procedure report does not answer your questions, please call your gastroenterologist to clarify.  If you requested that your care partner not be given the details of your procedure findings, then the procedure report has been included in a sealed envelope for you to review at your convenience later.  YOU SHOULD EXPECT: Some feelings of bloating in the abdomen. Passage of more gas than usual.  Walking can help get rid of the air that was put into your GI tract during the procedure and reduce the bloating. If you had a lower endoscopy (such as a colonoscopy or flexible sigmoidoscopy) you may notice spotting of blood in your stool or on the toilet paper. If you underwent a bowel prep for your procedure, you may not have a normal bowel movement for a few days.  Please Note:  You might notice some irritation and congestion in your nose or some drainage.  This is from the oxygen used during your procedure.  There is no need for concern and it should clear up in a day or so.  SYMPTOMS TO REPORT IMMEDIATELY:   Following lower endoscopy (colonoscopy or flexible sigmoidoscopy):  Excessive amounts of blood in the stool  Significant tenderness or worsening of abdominal pains  Swelling of the abdomen that is new, acute  Fever of 100F or higher   For urgent or emergent issues, a gastroenterologist can be reached at any hour by calling [phone].   DIET:  We do recommend a small meal at first, but then you may proceed to your regular diet.  Drink plenty of fluids but you should avoid alcoholic beverages for 24 hours. Try to increase the fiber in your diet.  ACTIVITY:  You should plan to take it easy for the rest of today and you should NOT DRIVE or use heavy machinery until tomorrow (because of the  sedation medicines used during the test).    FOLLOW UP: Our staff will call the number listed on your records the next business day following your procedure to check on you and address any questions or concerns that you may have regarding the information given to you following your procedure. If we do not reach you, we will leave a message.  However, if you are feeling well and you are not experiencing any problems, there is no need to return our call.  We will assume that you have returned to your regular daily activities without incident.    SIGNATURES/CONFIDENTIALITY: You and/or your care partner have signed paperwork which will be entered into your electronic medical record.  These signatures attest to the fact that that the information above on your After Visit Summary has been reviewed and is understood.  Full responsibility of the confidentiality of this discharge information lies with you and/or your care-partner.  Read all of the handouts given to you by your recovery room nurse.  Thank-you for choosing [place] for your healthcare needs today.

## 2016-01-02 ENCOUNTER — Telehealth: Payer: Self-pay | Admitting: *Deleted

## 2016-01-02 NOTE — Telephone Encounter (Signed)
  Follow up Call-  Call back number 01/01/2016  Post procedure Call Back phone  # [phone]  Permission to leave phone message Yes  Some recent data might be hidden     Patient questions:  Do you have a fever, pain , or abdominal swelling? No. Pain Score  0 *  Have you tolerated food without any problems? Yes.    Have you been able to return to your normal activities? Yes.    Do you have any questions about your discharge instructions: Diet   No. Medications  No. Follow up visit  No.  Do you have questions or concerns about your Care? No.  Actions: * If pain score is 4 or above: No action needed, pain <4.

## 2016-02-19 ENCOUNTER — Ambulatory Visit: Payer: 59 | Admitting: Nurse Practitioner

## 2016-03-17 ENCOUNTER — Ambulatory Visit: Payer: 59 | Admitting: Nurse Practitioner

## 2016-07-17 ENCOUNTER — Encounter: Payer: Self-pay | Admitting: Oncology

## 2016-07-24 ENCOUNTER — Other Ambulatory Visit: Payer: Self-pay | Admitting: Orthopedic Surgery

## 2016-07-24 DIAGNOSIS — M25562 Pain in left knee: Principal | ICD-10-CM

## 2016-07-24 DIAGNOSIS — M25561 Pain in right knee: Principal | ICD-10-CM

## 2016-07-24 DIAGNOSIS — G8929 Other chronic pain: Secondary | ICD-10-CM

## 2016-08-05 ENCOUNTER — Other Ambulatory Visit: Payer: 59

## 2016-08-05 ENCOUNTER — Ambulatory Visit
Admission: RE | Admit: 2016-08-05 | Discharge: 2016-08-05 | Disposition: A | Discharge status: Home / Self Care | Payer: 59 | Source: Ambulatory Visit | Attending: Orthopedic Surgery | Admitting: Orthopedic Surgery

## 2016-08-05 DIAGNOSIS — M25561 Pain in right knee: Principal | ICD-10-CM

## 2016-08-05 DIAGNOSIS — M25562 Pain in left knee: Principal | ICD-10-CM

## 2016-08-05 DIAGNOSIS — G8929 Other chronic pain: Secondary | ICD-10-CM

## 2016-11-06 LAB — HM DIABETES EYE EXAM

## 2017-07-16 ENCOUNTER — Encounter: Payer: Self-pay | Admitting: Obstetrics & Gynecology

## 2017-08-18 ENCOUNTER — Ambulatory Visit (INDEPENDENT_AMBULATORY_CARE_PROVIDER_SITE_OTHER): Payer: 59 | Admitting: Obstetrics & Gynecology

## 2017-08-18 ENCOUNTER — Encounter: Payer: Self-pay | Admitting: Obstetrics & Gynecology

## 2017-08-18 VITALS — BP 136/80 | Ht 61.0 in | Wt 191.0 lb

## 2017-08-18 DIAGNOSIS — Z780 Asymptomatic menopausal state: Secondary | ICD-10-CM

## 2017-08-18 DIAGNOSIS — C50412 Malignant neoplasm of upper-outer quadrant of left female breast: Secondary | ICD-10-CM

## 2017-08-18 DIAGNOSIS — Z01411 Encounter for gynecological examination (general) (routine) with abnormal findings: Secondary | ICD-10-CM | POA: Diagnosis not present

## 2017-08-18 DIAGNOSIS — Z853 Personal history of malignant neoplasm of breast: Secondary | ICD-10-CM

## 2017-08-18 DIAGNOSIS — Z90710 Acquired absence of both cervix and uterus: Secondary | ICD-10-CM

## 2017-08-18 NOTE — Addendum Note (Signed)
Addended by: Thurnell Garbe A on: 08/18/2017 03:32 PM   Modules accepted: Orders

## 2017-08-18 NOTE — Patient Instructions (Signed)
1. Encounter for gynecological examination with abnormal finding Gynecologic exam status post total hysterectomy.  Pap reflex done.  Breast exam normal.  History of left breast cancer.  Recent screening mammogram April 2019 normal per patient, will obtain.  Colonoscopy -March 2017.  Will do every 5 years because of sister's history of colon cancer.  Health labs with family physician.  2. Menopause present Well on no hormone replacement therapy.  Status post total hysterectomy.  Last bone density normal in March 2018.  Vitamin D supplements, calcium rich nutrition and regular weightbearing physical activity recommended.  3. History of total hysterectomy  4. Malignant neoplasm of upper-outer quadrant of left female breast, unspecified estrogen receptor status ([place]) Stopped letrozole after 5 years in 2017.  Followed by Dr. Jana Hakim.  Jaida, it was a pleasure seeing you today!  I will inform you of your results as soon as they are available.

## 2017-08-18 NOTE — Progress Notes (Signed)
[name] 11-Apr-1956 [id number]   History:    62 y.o. G0 Married.  Retired.  Has a beautiful dark brown [place] dog.  RP:  Established patient presenting for annual gyn exam   HPI: S/P Hysterectomy for Fibroids.  Menopause.  No HRT.  No pelvic pain.  H/O Left Breast Ca. letrozole for 5 years stopped in 2017. Screening mammogram at [facility] April 2019 normal per patient, will obtain.  Abstinent.  Urine and bowel movements normal.  Enjoys taking walks with her husband and their dog.  Body mass index 36.09.  Health labs with family physician.  Colonoscopy September 2017.  Will do every 5 years given that as her sister had colon cancer.  Past medical history,surgical history, family history and social history were all reviewed and documented in the EPIC chart.  Gynecologic History No LMP recorded. Patient has had a hysterectomy. Contraception: status post hysterectomy Last Pap: 2016. Results were: Negative Last mammogram: 07/2017. Results were: normal per patient, will obtain Bone Density: 06/2016 Normal Colonoscopy: 06/2015   Obstetric History OB History  Gravida Para Term Preterm AB Living  0 0 0 0 0 0  SAB TAB Ectopic Multiple Live Births  0 0 0 0 0     ROS: A ROS was performed and pertinent positives and negatives are included in the history.  GENERAL: No fevers or chills. HEENT: No change in vision, no earache, sore throat or sinus congestion. NECK: No pain or stiffness. CARDIOVASCULAR: No chest pain or pressure. No palpitations. PULMONARY: No shortness of breath, cough or wheeze. GASTROINTESTINAL: No abdominal pain, nausea, vomiting or diarrhea, melena or bright red blood per rectum. GENITOURINARY: No urinary frequency, urgency, hesitancy or dysuria. MUSCULOSKELETAL: No joint or muscle pain, no back pain, no recent trauma. DERMATOLOGIC: No rash, no itching, no lesions. ENDOCRINE: No polyuria, polydipsia, no heat or cold intolerance. No recent change in weight. HEMATOLOGICAL: No  anemia or easy bruising or bleeding. NEUROLOGIC: No headache, seizures, numbness, tingling or weakness. PSYCHIATRIC: No depression, no loss of interest in normal activity or change in sleep pattern.     Exam:   BP 136/80   Ht 5\' 1"  (1.549 m)   Wt 191 lb (86.6 kg)   BMI 36.09 kg/m   Body mass index is 36.09 kg/m.  General appearance : Well developed well nourished female. No acute distress HEENT: Eyes: no retinal hemorrhage or exudates,  Neck supple, trachea midline, no carotid bruits, no thyroidmegaly Lungs: Clear to auscultation, no rhonchi or wheezes, or rib retractions  Heart: Regular rate and rhythm, no murmurs or gallops Breast:Examined in sitting and supine position were symmetrical in appearance, no palpable masses or tenderness,  no skin retraction, no nipple inversion, no nipple discharge, no skin discoloration, no axillary or supraclavicular lymphadenopathy Abdomen: no palpable masses or tenderness, no rebound or guarding Extremities: no edema or skin discoloration or tenderness  Pelvic: Vulva: Normal             Vagina: No gross lesions or discharge.  Pap reflex done.  Cervix/Uterus absent  Adnexa  Without masses or tenderness  Anus: Normal   Assessment/Plan:  62 y.o. female for annual exam   1. Encounter for gynecological examination with abnormal finding Gynecologic exam status post total hysterectomy.  Pap reflex done.  Breast exam normal.  History of left breast cancer.  Recent screening mammogram April 2019 normal per patient, will obtain.  Colonoscopy -March 2017.  Will do every 5 years because of sister's history of  colon cancer.  Health labs with family physician.  2. Menopause present Well on no hormone replacement therapy.  Status post total hysterectomy.  Last bone density normal in March 2018.  Vitamin D supplements, calcium rich nutrition and regular weightbearing physical activity recommended.  3. History of total hysterectomy  4. Malignant neoplasm of  upper-outer quadrant of left female breast, unspecified estrogen receptor status ([place]) Stopped letrozole after 5 years in 2017.  Followed by Dr. Jana Hakim.  Princess Bruins MD, 2:17 PM 08/18/2017

## 2017-08-19 LAB — PAP IG W/ RFLX HPV ASCU

## 2018-08-20 ENCOUNTER — Encounter: Payer: 59 | Admitting: Obstetrics & Gynecology

## 2018-10-12 ENCOUNTER — Encounter: Payer: Self-pay | Admitting: Anesthesiology

## 2018-10-13 ENCOUNTER — Other Ambulatory Visit: Payer: Self-pay

## 2018-10-14 ENCOUNTER — Ambulatory Visit: Payer: 59 | Admitting: Obstetrics & Gynecology

## 2018-10-14 ENCOUNTER — Encounter: Payer: Self-pay | Admitting: Obstetrics & Gynecology

## 2018-10-14 VITALS — BP 138/86 | Ht 61.0 in | Wt 197.0 lb

## 2018-10-14 DIAGNOSIS — Z780 Asymptomatic menopausal state: Secondary | ICD-10-CM | POA: Diagnosis not present

## 2018-10-14 DIAGNOSIS — E6609 Other obesity due to excess calories: Secondary | ICD-10-CM

## 2018-10-14 DIAGNOSIS — Z90710 Acquired absence of both cervix and uterus: Secondary | ICD-10-CM | POA: Diagnosis not present

## 2018-10-14 DIAGNOSIS — Z01419 Encounter for gynecological examination (general) (routine) without abnormal findings: Secondary | ICD-10-CM | POA: Diagnosis not present

## 2018-10-14 DIAGNOSIS — Z6837 Body mass index (BMI) 37.0-37.9, adult: Secondary | ICD-10-CM

## 2018-10-14 DIAGNOSIS — M85852 Other specified disorders of bone density and structure, left thigh: Secondary | ICD-10-CM

## 2018-10-14 NOTE — Patient Instructions (Signed)
1. Well female exam with routine gynecological exam Gynecologic exam status post total hysterectomy and menopause.  Pap test April 2019 was negative, no indication to repeat at this time.  Breast exam normal.  Screening mammogram in June 2020 was normal per patient, will obtain the report from [place].  Colonoscopy 2017.  Health labs with family physician.  2. History of total hysterectomy  3. Postmenopause Well on no hormone replacement therapy.  4. Osteopenia of neck of left femur Very mild osteopenia on bone density June 2020 with a T score of -1.2 at the left femoral neck.  Recommend vitamin D supplements, calcium intake of 1200 mg daily and regular weightbearing physical activities.  5. Class 2 obesity due to excess calories without serious comorbidity with body mass index (BMI) of 37.0 to 37.9 in adult Recommend low calorie/carb diet such as [employer].  Aerobic physical activities 5 times a week and weightlifting every 2 days.  [name], it was a pleasure seeing you today!

## 2018-10-14 NOTE — Progress Notes (Signed)
[name] 26-Jul-1955 [id number]   History:    63 y.o. G0 Married  RP:  Established patient presenting for annual gyn exam   HPI: S/P Total Hysterectomy for Fibroids.  Menopause, well on no HRT.  No pelvic pain.  Abstinent.  Urine/BMs normal.  Breasts normal.  BMI 37.22.   Gardening.  Health labs with Fam MD.  Past medical history,surgical history, family history and social history were all reviewed and documented in the EPIC chart.  Gynecologic History No LMP recorded. Patient has had a hysterectomy. Contraception: status post hysterectomy Last Pap: 07/2017. Results were: Negative Last mammogram: 09/2018. Results were: normal per patient, will obtain report from [facility] Bone Density: 09/2018 Osteopenia T-Score -1.2 Colonoscopy: 2017  Obstetric History OB History  Gravida Para Term Preterm AB Living  0 0 0 0 0 0  SAB TAB Ectopic Multiple Live Births  0 0 0 0 0     ROS: A ROS was performed and pertinent positives and negatives are included in the history.  GENERAL: No fevers or chills. HEENT: No change in vision, no earache, sore throat or sinus congestion. NECK: No pain or stiffness. CARDIOVASCULAR: No chest pain or pressure. No palpitations. PULMONARY: No shortness of breath, cough or wheeze. GASTROINTESTINAL: No abdominal pain, nausea, vomiting or diarrhea, melena or bright red blood per rectum. GENITOURINARY: No urinary frequency, urgency, hesitancy or dysuria. MUSCULOSKELETAL: No joint or muscle pain, no back pain, no recent trauma. DERMATOLOGIC: No rash, no itching, no lesions. ENDOCRINE: No polyuria, polydipsia, no heat or cold intolerance. No recent change in weight. HEMATOLOGICAL: No anemia or easy bruising or bleeding. NEUROLOGIC: No headache, seizures, numbness, tingling or weakness. PSYCHIATRIC: No depression, no loss of interest in normal activity or change in sleep pattern.     Exam:   BP 138/86   Ht 5\' 1"  (1.549 m)   Wt 197 lb (89.4 kg)   BMI 37.22 kg/m    Body mass index is 37.22 kg/m.  General appearance : Well developed well nourished female. No acute distress HEENT: Eyes: no retinal hemorrhage or exudates,  Neck supple, trachea midline, no carotid bruits, no thyroidmegaly Lungs: Clear to auscultation, no rhonchi or wheezes, or rib retractions  Heart: Regular rate and rhythm, no murmurs or gallops Breast:Examined in sitting and supine position were symmetrical in appearance, no palpable masses or tenderness,  no skin retraction, no nipple inversion, no nipple discharge, no skin discoloration, no axillary or supraclavicular lymphadenopathy Abdomen: no palpable masses or tenderness, no rebound or guarding Extremities: no edema or skin discoloration or tenderness  Pelvic: Vulva: Normal             Vagina: No gross lesions or discharge  Cervix/Uterus absent  Adnexa  Without masses or tenderness  Anus: Normal   Assessment/Plan:  63 y.o. female for annual exam   1. Well female exam with routine gynecological exam Gynecologic exam status post total hysterectomy and menopause.  Pap test April 2019 was negative, no indication to repeat at this time.  Breast exam normal.  Screening mammogram in June 2020 was normal per patient, will obtain the report from [place].  Colonoscopy 2017.  Health labs with family physician.  2. History of total hysterectomy  3. Postmenopause Well on no hormone replacement therapy.  4. Osteopenia of neck of left femur Very mild osteopenia on bone density June 2020 with a T score of -1.2 at the left femoral neck.  Recommend vitamin D supplements, calcium intake of 1200 mg daily  and regular weightbearing physical activities.  5. Class 2 obesity due to excess calories without serious comorbidity with body mass index (BMI) of 37.0 to 37.9 in adult Recommend low calorie/carb diet such as [employer].  Aerobic physical activities 5 times a week and weightlifting every 2 days.  Princess Bruins MD, 11:39 AM 10/14/2018

## 2018-11-23 DIAGNOSIS — E119 Type 2 diabetes mellitus without complications: Secondary | ICD-10-CM | POA: Diagnosis not present

## 2018-11-23 DIAGNOSIS — Z853 Personal history of malignant neoplasm of breast: Secondary | ICD-10-CM | POA: Diagnosis not present

## 2018-11-23 DIAGNOSIS — Z6837 Body mass index (BMI) 37.0-37.9, adult: Secondary | ICD-10-CM | POA: Diagnosis not present

## 2018-11-23 DIAGNOSIS — E785 Hyperlipidemia, unspecified: Secondary | ICD-10-CM | POA: Diagnosis not present

## 2018-11-23 DIAGNOSIS — G629 Polyneuropathy, unspecified: Secondary | ICD-10-CM | POA: Diagnosis not present

## 2018-11-23 DIAGNOSIS — I10 Essential (primary) hypertension: Secondary | ICD-10-CM | POA: Diagnosis not present

## 2018-11-23 DIAGNOSIS — Z1331 Encounter for screening for depression: Secondary | ICD-10-CM | POA: Diagnosis not present

## 2018-11-23 DIAGNOSIS — K219 Gastro-esophageal reflux disease without esophagitis: Secondary | ICD-10-CM | POA: Diagnosis not present

## 2019-02-08 DIAGNOSIS — M25562 Pain in left knee: Secondary | ICD-10-CM | POA: Diagnosis not present

## 2019-02-08 DIAGNOSIS — M1712 Unilateral primary osteoarthritis, left knee: Secondary | ICD-10-CM | POA: Diagnosis not present

## 2019-02-22 DIAGNOSIS — E1142 Type 2 diabetes mellitus with diabetic polyneuropathy: Secondary | ICD-10-CM | POA: Diagnosis not present

## 2019-02-22 DIAGNOSIS — I10 Essential (primary) hypertension: Secondary | ICD-10-CM | POA: Diagnosis not present

## 2019-02-22 DIAGNOSIS — Z23 Encounter for immunization: Secondary | ICD-10-CM | POA: Diagnosis not present

## 2019-02-22 DIAGNOSIS — Z6837 Body mass index (BMI) 37.0-37.9, adult: Secondary | ICD-10-CM | POA: Diagnosis not present

## 2019-02-22 DIAGNOSIS — K219 Gastro-esophageal reflux disease without esophagitis: Secondary | ICD-10-CM | POA: Diagnosis not present

## 2019-02-22 DIAGNOSIS — Z853 Personal history of malignant neoplasm of breast: Secondary | ICD-10-CM | POA: Diagnosis not present

## 2019-02-22 DIAGNOSIS — E119 Type 2 diabetes mellitus without complications: Secondary | ICD-10-CM | POA: Diagnosis not present

## 2019-02-22 DIAGNOSIS — E785 Hyperlipidemia, unspecified: Secondary | ICD-10-CM | POA: Diagnosis not present

## 2019-02-22 DIAGNOSIS — G629 Polyneuropathy, unspecified: Secondary | ICD-10-CM | POA: Diagnosis not present

## 2019-02-22 DIAGNOSIS — Z9181 History of falling: Secondary | ICD-10-CM | POA: Diagnosis not present

## 2019-03-18 DIAGNOSIS — M1712 Unilateral primary osteoarthritis, left knee: Secondary | ICD-10-CM | POA: Diagnosis not present

## 2019-03-18 DIAGNOSIS — M25562 Pain in left knee: Secondary | ICD-10-CM | POA: Diagnosis not present

## 2019-03-23 DIAGNOSIS — M25562 Pain in left knee: Secondary | ICD-10-CM | POA: Diagnosis not present

## 2019-03-23 DIAGNOSIS — M1712 Unilateral primary osteoarthritis, left knee: Secondary | ICD-10-CM | POA: Diagnosis not present

## 2019-03-30 DIAGNOSIS — M25562 Pain in left knee: Secondary | ICD-10-CM | POA: Diagnosis not present

## 2019-03-30 DIAGNOSIS — M1712 Unilateral primary osteoarthritis, left knee: Secondary | ICD-10-CM | POA: Diagnosis not present

## 2019-08-23 DIAGNOSIS — G629 Polyneuropathy, unspecified: Secondary | ICD-10-CM | POA: Diagnosis not present

## 2019-08-23 DIAGNOSIS — E785 Hyperlipidemia, unspecified: Secondary | ICD-10-CM | POA: Diagnosis not present

## 2019-08-23 DIAGNOSIS — E2839 Other primary ovarian failure: Secondary | ICD-10-CM | POA: Diagnosis not present

## 2019-08-23 DIAGNOSIS — K219 Gastro-esophageal reflux disease without esophagitis: Secondary | ICD-10-CM | POA: Diagnosis not present

## 2019-08-23 DIAGNOSIS — E1142 Type 2 diabetes mellitus with diabetic polyneuropathy: Secondary | ICD-10-CM | POA: Diagnosis not present

## 2019-08-23 DIAGNOSIS — Z853 Personal history of malignant neoplasm of breast: Secondary | ICD-10-CM | POA: Diagnosis not present

## 2019-08-23 DIAGNOSIS — Z1231 Encounter for screening mammogram for malignant neoplasm of breast: Secondary | ICD-10-CM | POA: Diagnosis not present

## 2019-08-23 DIAGNOSIS — E119 Type 2 diabetes mellitus without complications: Secondary | ICD-10-CM | POA: Diagnosis not present

## 2019-08-23 DIAGNOSIS — I10 Essential (primary) hypertension: Secondary | ICD-10-CM | POA: Diagnosis not present

## 2019-08-23 DIAGNOSIS — Z6838 Body mass index (BMI) 38.0-38.9, adult: Secondary | ICD-10-CM | POA: Diagnosis not present

## 2019-09-07 DIAGNOSIS — L821 Other seborrheic keratosis: Secondary | ICD-10-CM | POA: Diagnosis not present

## 2019-09-07 DIAGNOSIS — H61009 Unspecified perichondritis of external ear, unspecified ear: Secondary | ICD-10-CM | POA: Diagnosis not present

## 2019-09-07 DIAGNOSIS — H61001 Unspecified perichondritis of right external ear: Secondary | ICD-10-CM | POA: Diagnosis not present

## 2019-09-07 DIAGNOSIS — D485 Neoplasm of uncertain behavior of skin: Secondary | ICD-10-CM | POA: Diagnosis not present

## 2019-10-10 DIAGNOSIS — Z1231 Encounter for screening mammogram for malignant neoplasm of breast: Secondary | ICD-10-CM | POA: Diagnosis not present

## 2019-10-17 ENCOUNTER — Encounter: Payer: 59 | Admitting: Obstetrics & Gynecology

## 2019-10-28 ENCOUNTER — Encounter: Payer: 59 | Admitting: Obstetrics & Gynecology

## 2019-11-01 ENCOUNTER — Encounter: Payer: Self-pay | Admitting: Obstetrics & Gynecology

## 2019-11-01 ENCOUNTER — Other Ambulatory Visit: Payer: Self-pay

## 2019-11-01 ENCOUNTER — Ambulatory Visit (INDEPENDENT_AMBULATORY_CARE_PROVIDER_SITE_OTHER): Payer: Medicare HMO | Admitting: Obstetrics & Gynecology

## 2019-11-01 VITALS — BP 128/80 | Ht 61.0 in | Wt 195.0 lb

## 2019-11-01 DIAGNOSIS — Z9289 Personal history of other medical treatment: Secondary | ICD-10-CM

## 2019-11-01 DIAGNOSIS — Z6836 Body mass index (BMI) 36.0-36.9, adult: Secondary | ICD-10-CM

## 2019-11-01 DIAGNOSIS — M85852 Other specified disorders of bone density and structure, left thigh: Secondary | ICD-10-CM

## 2019-11-01 DIAGNOSIS — C50412 Malignant neoplasm of upper-outer quadrant of left female breast: Secondary | ICD-10-CM

## 2019-11-01 DIAGNOSIS — Z01419 Encounter for gynecological examination (general) (routine) without abnormal findings: Secondary | ICD-10-CM | POA: Diagnosis not present

## 2019-11-01 DIAGNOSIS — Z853 Personal history of malignant neoplasm of breast: Secondary | ICD-10-CM

## 2019-11-01 DIAGNOSIS — Z780 Asymptomatic menopausal state: Secondary | ICD-10-CM

## 2019-11-01 DIAGNOSIS — M8588 Other specified disorders of bone density and structure, other site: Secondary | ICD-10-CM

## 2019-11-01 DIAGNOSIS — Z90710 Acquired absence of both cervix and uterus: Secondary | ICD-10-CM

## 2019-11-01 NOTE — Progress Notes (Signed)
[name] Sep 07, 1955 [id number]   History:    64 y.o. G0 Married  RP:  Established patient presenting for annual gyn exam   HPI: S/P Total Hysterectomy for Fibroids.  Menopause, well on no HRT.  No pelvic pain.  Abstinent.  Urine/BMs normal.  H/O Breast Ca followed by Dr Jana Hakim. Breasts normal.  BMI 36.84.   Gardening.  Health labs with Fam MD.  Sister with Colon Ca.  Colono 2017, every 5 yrs.   Past medical history,surgical history, family history and social history were all reviewed and documented in the EPIC chart.  Gynecologic History No LMP recorded. Patient has had a hysterectomy.  Obstetric History OB History  Gravida Para Term Preterm AB Living  0 0 0 0 0 0  SAB TAB Ectopic Multiple Live Births  0 0 0 0 0     ROS: A ROS was performed and pertinent positives and negatives are included in the history.  GENERAL: No fevers or chills. HEENT: No change in vision, no earache, sore throat or sinus congestion. NECK: No pain or stiffness. CARDIOVASCULAR: No chest pain or pressure. No palpitations. PULMONARY: No shortness of breath, cough or wheeze. GASTROINTESTINAL: No abdominal pain, nausea, vomiting or diarrhea, melena or bright red blood per rectum. GENITOURINARY: No urinary frequency, urgency, hesitancy or dysuria. MUSCULOSKELETAL: No joint or muscle pain, no back pain, no recent trauma. DERMATOLOGIC: No rash, no itching, no lesions. ENDOCRINE: No polyuria, polydipsia, no heat or cold intolerance. No recent change in weight. HEMATOLOGICAL: No anemia or easy bruising or bleeding. NEUROLOGIC: No headache, seizures, numbness, tingling or weakness. PSYCHIATRIC: No depression, no loss of interest in normal activity or change in sleep pattern.     Exam:   BP 128/80 (BP Location: Right Arm, Patient Position: Sitting, Cuff Size: Large)   Ht 5\' 1"  (1.549 m)   Wt 195 lb (88.5 kg)   BMI 36.84 kg/m   Body mass index is 36.84 kg/m.  General appearance : Well developed well  nourished female. No acute distress HEENT: Eyes: no retinal hemorrhage or exudates,  Neck supple, trachea midline, no carotid bruits, no thyroidmegaly Lungs: Clear to auscultation, no rhonchi or wheezes, or rib retractions  Heart: Regular rate and rhythm, no murmurs or gallops Breast:Examined in sitting and supine position were symmetrical in appearance, no palpable masses or tenderness,  no skin retraction, no nipple inversion, no nipple discharge, no skin discoloration, no axillary or supraclavicular lymphadenopathy Abdomen: no palpable masses or tenderness, no rebound or guarding Extremities: no edema or skin discoloration or tenderness  Pelvic: Vulva: Normal             Vagina: No gross lesions or discharge  Cervix/Uterus absent  Adnexa  Without masses or tenderness  Anus: Normal   Assessment/Plan:  64 y.o. female for annual exam   1. Well female exam with routine gynecological exam Gynecologic exam status post total hysterectomy.  Pap test -2019, no indication to repeat this year.  Breast exam normal.  Screening mammogram June 2021 - per patient, will obtain report from [place].  Colonoscopy 2017.  On a 5-year schedule because of sister with colon cancer.  Health labs with family physician.  2. History of total hysterectomy  3. Postmenopause Well on no hormone replacement therapy.  4. Osteopenia of neck of left femur Very mild osteopenia with a T score at -1.2 at the neck of the left femur in 2019.  Will repeat at 3 years.  Calcium 1200 mg daily, vitamin  D supplements, regular weightbearing physical activities.  5. Malignant neoplasm of upper-outer quadrant of left female breast, unspecified estrogen receptor status ([place]) Followed by Dr. Jana Hakim.  6. Class 2 severe obesity due to excess calories with serious comorbidity and body mass index (BMI) of 36.0 to 36.9 in adult [facility]) Continue on a low calorie/carb diet such as [employer].  Aerobic activities 5 times a week and light  weightlifting every 2 days.  Princess Bruins MD, 10:11 AM 11/01/2019

## 2019-11-01 NOTE — Patient Instructions (Signed)
1. Well female exam with routine gynecological exam Gynecologic exam status post total hysterectomy.  Pap test -2019, no indication to repeat this year.  Breast exam normal.  Screening mammogram June 2021 - per patient, will obtain report from [place].  Colonoscopy 2017.  On a 5-year schedule because of sister with colon cancer.  Health labs with family physician.  2. History of total hysterectomy  3. Postmenopause Well on no hormone replacement therapy.  4. Osteopenia of neck of left femur Very mild osteopenia with a T score at -1.2 at the neck of the left femur in 2019.  Will repeat at 3 years.  Calcium 1200 mg daily, vitamin D supplements, regular weightbearing physical activities.  5. Malignant neoplasm of upper-outer quadrant of left female breast, unspecified estrogen receptor status ([place]) Followed by Dr. Jana Hakim.  6. Class 2 severe obesity due to excess calories with serious comorbidity and body mass index (BMI) of 36.0 to 36.9 in adult [facility]) Continue on a low calorie/carb diet such as [employer].  Aerobic activities 5 times a week and light weightlifting every 2 days.  [name], it was a pleasure seeing you today!

## 2019-11-25 ENCOUNTER — Encounter: Payer: 59 | Admitting: Obstetrics & Gynecology

## 2020-03-28 ENCOUNTER — Ambulatory Visit: Payer: Medicare HMO | Admitting: Endocrinology

## 2020-03-28 ENCOUNTER — Encounter: Payer: Self-pay | Admitting: Endocrinology

## 2020-03-28 ENCOUNTER — Other Ambulatory Visit: Payer: Self-pay

## 2020-03-28 VITALS — BP 142/95 | HR 113 | Ht 60.0 in | Wt 200.4 lb

## 2020-03-28 DIAGNOSIS — R200 Anesthesia of skin: Secondary | ICD-10-CM

## 2020-03-28 DIAGNOSIS — E1142 Type 2 diabetes mellitus with diabetic polyneuropathy: Secondary | ICD-10-CM | POA: Diagnosis not present

## 2020-03-28 LAB — POCT GLYCOSYLATED HEMOGLOBIN (HGB A1C): Hemoglobin A1C: 6.2 % — AB (ref 4.0–5.6)

## 2020-03-28 MED ORDER — METFORMIN HCL 1000 MG PO TABS
500.0000 mg | ORAL_TABLET | Freq: Every day | ORAL | 1 refills | Status: DC
Start: 2020-03-28 — End: 2020-08-07

## 2020-03-28 NOTE — Patient Instructions (Addendum)
Your blood pressure is high today.  Please see your primary care provider soon, to have it rechecked good diet and exercise significantly improve the control of your diabetes.  please let me know if you wish to be referred to a dietician.  high blood sugar is very risky to your health.  you should see an eye doctor and dentist every year.  It is very important to get all recommended vaccinations.  Controlling your blood pressure and cholesterol drastically reduces the damage diabetes does to your body.  Those who smoke should quit.  Please discuss these with your doctor.  check your blood sugar once a day.  vary the time of day when you check, between before the 3 meals, and at bedtime.  also check if you have symptoms of your blood sugar being too high or too low.  please keep a record of the readings and bring it to your next appointment here (or you can bring the meter itself).  You can write it on any piece of paper.  please call us sooner if your blood sugar goes below 70, or if you have a lot of readings over 200.  Blood tests are requested for you today.  We'll let you know about the results.  Please resume the metformin at 1/2 of a 1000 mg pill, each morning.   Please come back for a follow-up appointment in 3 months.

## 2020-03-28 NOTE — Progress Notes (Signed)
Subjective:    Patient ID: [name], female    DOB: 1956-02-04, 64 y.o.   MRN: [id number]  HPI pt is referred by Charlott Holler, NP, for diabetes.  Pt states DM was dx'ed in 2831; it is complicated by PN; she has never been on insulin; pt says her diet and exercise are good; she has never had GDM (G0), pancreatitis, pancreatic surgery, severe hypoglycemia or DKA.  She stopped metformin (1000 mg BID), 2 mos ago, due to lightheadedness.  Since off it, sxs have resolved.  she brings a record of her fasting cbg's which I have reviewed today.  cbg varies from 104-139.  She has lots of metformin-IR that she would like to use up.   Past Medical History:  Diagnosis Date  . Anesthesia complication    Hard to wake up past knee surgery in ? 2004!  Marland Kitchen Arthritis    knees  . Carpal tunnel syndrome of right wrist 08/2012  . Cataract   . Diabetes mellitus type 2, noninsulin dependent ([place])   . GERD (gastroesophageal reflux disease)   . History of breast cancer 04/2010   left  . Hyperlipidemia   . Hypertension    under control with med., has been on med. x 6-7 yr.  . Lesion of right ulnar nerve 08/2012  . S/P radiation therapy /last radiation treatment 07/2010 07/04/10 - 08/11/10   5040 cGy/28 Treatments to Left Breast  . Wears dentures    upper  . Wears partial dentures    lower    Past Surgical History:  Procedure Laterality Date  . CARPAL TUNNEL RELEASE Right 09/21/2012   Procedure: CARPAL TUNNEL RELEASE;  Surgeon: Cammie Sickle., MD;  Location: [place];  Service: Orthopedics;  Laterality: Right;  . CARPAL TUNNEL RELEASE Left 11/30/2012   Procedure: CARPAL TUNNEL RELEASE;  Surgeon: Cammie Sickle., MD;  Location: [place];  Service: Orthopedics;  Laterality: Left;  . CATARACT EXTRACTION     Bil/ right eye on 08/27/2015 and left eye on 09/17/2015 Dr Tommy Rainwater  . CHOLECYSTECTOMY  09/16/2005  . KNEE ARTHROSCOPY Left 08/11/2002  . PARTIAL MASTECTOMY WITH NEEDLE  LOCALIZATION AND AXILLARY SENTINEL LYMPH NODE BX Left 05/21/2010   with re-exc. medial margin lumpectomy site  . ROBOTIC ASSISTED LAPAROSCOPIC LYSIS OF ADHESION  09/17/2010  . ROBOTIC ASSISTED TOTAL HYSTERECTOMY WITH BILATERAL SALPINGO OOPHERECTOMY  09/17/2010  . SHOULDER OPEN ROTATOR CUFF REPAIR Right 03/29/2013   Procedure: ROTATOR CUFF REPAIR SHOULDER OPEN WITH SUBACROMIAL DECOMPRESSION AND DISTAL CLAVICLE RESECTION;  Surgeon: Cammie Sickle., MD;  Location: [place];  Service: Orthopedics;  Laterality: Right;  . ULNAR NERVE TRANSPOSITION Right 09/21/2012   Procedure: RIGHT IN SITU DECOMPRESSION ULNAR NERVE;  Surgeon: Cammie Sickle., MD;  Location: [place];  Service: Orthopedics;  Laterality: Right;    Social History   Socioeconomic History  . Marital status: Married    Spouse name: Not on file  . Number of children: Not on file  . Years of education: Not on file  . Highest education level: Not on file  Occupational History  . Not on file  Tobacco Use  . Smoking status: Former Smoker    Quit date: 05/07/2010    Years since quitting: 9.9  . Smokeless tobacco: Never Used  Vaping Use  . Vaping Use: Never used  Substance and Sexual Activity  . Alcohol use: No  . Drug use: No  .  Sexual activity: Not Currently    Partners: Male    Comment: 1st intercoruse- 41, partners- 2, married- 47 yrs  Other Topics Concern  . 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 Difficulty of Paying Living Expenses: Not on file  Food Insecurity:   . Worried About Charity fundraiser in the Last Year: Not on file  . Ran Out of Food in the Last Year: Not on file  Transportation Needs:   . Lack of Transportation (Medical): Not on file  . Lack of Transportation (Non-Medical): Not on file  Physical Activity:   . Days of  Exercise per Week: Not on file  . Minutes of Exercise per Session: Not on file  Stress:   . Feeling of Stress : Not on file  Social Connections:   . Frequency of Communication with Friends and Family: Not on file  . Frequency of Social Gatherings with Friends and Family: Not on file  . Attends Religious Services: Not on file  . Active Member of Clubs or Organizations: Not on file  . Attends Archivist Meetings: Not on file  . Marital Status: Not on file  Intimate Partner Violence:   . Fear of Current or Ex-Partner: Not on file  . Emotionally Abused: Not on file  . Physically Abused: Not on file  . Sexually Abused: Not on file    Current Outpatient Medications on File Prior to Visit  Medication Sig Dispense Refill  . amitriptyline (ELAVIL) 100 MG tablet Take 300 mg by mouth at bedtime.     Marland Kitchen losartan (COZAAR) 100 MG tablet Take 100 mg by mouth Daily.     Marland Kitchen lovastatin (MEVACOR) 10 MG tablet Take 10 mg by mouth at bedtime.    . ONE TOUCH ULTRA TEST test strip Daily.    . famotidine (PEPCID) 40 MG tablet     . ranitidine (ZANTAC) 300 MG capsule Take 300 mg by mouth every evening. (Patient not taking: Reported on 03/28/2020)     Current Facility-Administered Medications on File Prior to Visit  Medication Dose Route Frequency Provider Last Rate Last Admin  . 0.9 %  sodium chloride infusion  500 mL Intravenous Continuous Armbruster, Carlota Raspberry, MD        Allergies  Allergen Reactions  . Adhesive [Tape] Other (See Comments)    BLISTERS  . Hydrocodone Nausea And Vomiting    Family History  Problem Relation Age of Onset  . Heart disease Mother   . Diabetes Mother   . Heart disease Father   . Diabetes Father   . Breast cancer Sister   . Diabetes Sister   . Colon cancer Sister   . Lung cancer Sister     BP (!) 142/95   Pulse (!) 113   Ht 5' (1.524 m)   Wt 200 lb 6.4 oz (90.9 kg)   SpO2 97%   BMI 39.14 kg/m     Review of Systems denies weight loss, blurry  vision, sob, n/v, urinary frequency, memory loss, and depression.      Objective:   Physical Exam VITAL SIGNS:  See vs page GENERAL: no distress Pulses: dorsalis pedis intact bilat.   MSK: no deformity of the feet.  CV: no leg edema Skin:  no ulcer on the feet, but the skin is dry.  normal color and temp on the feet.   Neuro: sensation is intact to touch on the feet, but decreased from normal.     Lab Results  Component Value Date   HGBA1C 6.2 (A) 03/28/2020   I have reviewed outside records, and summarized: Pt was noted to have elevated A1c, and referred here.  HTN, GERD, and dyslipidemia were also addressed      Assessment & Plan:  Type 2 DM: well-controlled Lightheadedness, poss due to metformin.  We discussed.  She chooses to resume metformin at a low dosage. Tachycardia: check TFT.   Patient Instructions  Your blood pressure is high today.  Please see your primary care provider soon, to have it rechecked good diet and exercise significantly improve the control of your diabetes.  please let me know if you wish to be referred to a dietician.  high blood sugar is very risky to your health.  you should see an eye doctor and dentist every year.  It is very important to get all recommended vaccinations.  Controlling your blood pressure and cholesterol drastically reduces the damage diabetes does to your body.  Those who smoke should quit.  Please discuss these with your doctor.  check your blood sugar once a day.  vary the time of day when you check, between before the 3 meals, and at bedtime.  also check if you have symptoms of your blood sugar being too high or too low.  please keep a record of the readings and bring it to your next appointment here (or you can bring the meter itself).  You can write it on any piece of paper.  please call us sooner if your blood sugar goes below 70, or if you have a lot of readings over 200.  Blood tests are requested for you today.  We'll let you  know about the results.  Please resume the metformin at 1/2 of a 1000 mg pill, each morning.   Please come back for a follow-up appointment in 3 months.

## 2020-04-13 DIAGNOSIS — H52223 Regular astigmatism, bilateral: Secondary | ICD-10-CM | POA: Diagnosis not present

## 2020-04-13 DIAGNOSIS — H02834 Dermatochalasis of left upper eyelid: Secondary | ICD-10-CM | POA: Diagnosis not present

## 2020-04-13 DIAGNOSIS — H40013 Open angle with borderline findings, low risk, bilateral: Secondary | ICD-10-CM | POA: Diagnosis not present

## 2020-04-13 DIAGNOSIS — H02831 Dermatochalasis of right upper eyelid: Secondary | ICD-10-CM | POA: Diagnosis not present

## 2020-04-23 DIAGNOSIS — E1169 Type 2 diabetes mellitus with other specified complication: Secondary | ICD-10-CM | POA: Diagnosis not present

## 2020-04-23 DIAGNOSIS — E785 Hyperlipidemia, unspecified: Secondary | ICD-10-CM | POA: Diagnosis not present

## 2020-04-23 DIAGNOSIS — Z20822 Contact with and (suspected) exposure to covid-19: Secondary | ICD-10-CM | POA: Diagnosis not present

## 2020-06-06 ENCOUNTER — Other Ambulatory Visit: Payer: Self-pay | Admitting: Family Medicine

## 2020-06-06 DIAGNOSIS — Z122 Encounter for screening for malignant neoplasm of respiratory organs: Secondary | ICD-10-CM

## 2020-06-06 DIAGNOSIS — Z6841 Body Mass Index (BMI) 40.0 and over, adult: Secondary | ICD-10-CM | POA: Diagnosis not present

## 2020-06-06 DIAGNOSIS — Z79899 Other long term (current) drug therapy: Secondary | ICD-10-CM | POA: Diagnosis not present

## 2020-06-06 DIAGNOSIS — Z139 Encounter for screening, unspecified: Secondary | ICD-10-CM | POA: Diagnosis not present

## 2020-06-06 DIAGNOSIS — E1169 Type 2 diabetes mellitus with other specified complication: Secondary | ICD-10-CM | POA: Diagnosis not present

## 2020-06-06 DIAGNOSIS — Z1331 Encounter for screening for depression: Secondary | ICD-10-CM | POA: Diagnosis not present

## 2020-06-06 DIAGNOSIS — E1142 Type 2 diabetes mellitus with diabetic polyneuropathy: Secondary | ICD-10-CM | POA: Diagnosis not present

## 2020-06-06 DIAGNOSIS — E785 Hyperlipidemia, unspecified: Secondary | ICD-10-CM | POA: Diagnosis not present

## 2020-06-06 DIAGNOSIS — Z9181 History of falling: Secondary | ICD-10-CM | POA: Diagnosis not present

## 2020-06-06 DIAGNOSIS — Z23 Encounter for immunization: Secondary | ICD-10-CM | POA: Diagnosis not present

## 2020-06-06 DIAGNOSIS — I10 Essential (primary) hypertension: Secondary | ICD-10-CM | POA: Diagnosis not present

## 2020-06-06 DIAGNOSIS — G629 Polyneuropathy, unspecified: Secondary | ICD-10-CM | POA: Diagnosis not present

## 2020-06-25 ENCOUNTER — Ambulatory Visit: Payer: Medicare HMO

## 2020-06-27 ENCOUNTER — Ambulatory Visit: Payer: Medicare HMO | Admitting: Endocrinology

## 2020-07-11 ENCOUNTER — Other Ambulatory Visit: Payer: Self-pay

## 2020-07-11 ENCOUNTER — Ambulatory Visit
Admission: RE | Admit: 2020-07-11 | Discharge: 2020-07-11 | Disposition: A | Discharge status: Home / Self Care | Payer: Medicare HMO | Source: Ambulatory Visit | Attending: Family Medicine | Admitting: Family Medicine

## 2020-07-11 DIAGNOSIS — R931 Abnormal findings on diagnostic imaging of heart and coronary circulation: Secondary | ICD-10-CM

## 2020-07-11 DIAGNOSIS — J432 Centrilobular emphysema: Secondary | ICD-10-CM | POA: Diagnosis not present

## 2020-07-11 DIAGNOSIS — J929 Pleural plaque without asbestos: Secondary | ICD-10-CM | POA: Diagnosis not present

## 2020-07-11 DIAGNOSIS — I2510 Atherosclerotic heart disease of native coronary artery without angina pectoris: Secondary | ICD-10-CM | POA: Diagnosis not present

## 2020-07-11 DIAGNOSIS — Z122 Encounter for screening for malignant neoplasm of respiratory organs: Secondary | ICD-10-CM

## 2020-07-11 DIAGNOSIS — Z87891 Personal history of nicotine dependence: Secondary | ICD-10-CM | POA: Diagnosis not present

## 2020-07-11 HISTORY — DX: Abnormal findings on diagnostic imaging of heart and coronary circulation: R93.1

## 2020-07-25 ENCOUNTER — Encounter: Payer: Self-pay | Admitting: *Deleted

## 2020-07-25 ENCOUNTER — Ambulatory Visit: Payer: Medicare HMO | Admitting: Endocrinology

## 2020-08-07 ENCOUNTER — Ambulatory Visit (INDEPENDENT_AMBULATORY_CARE_PROVIDER_SITE_OTHER): Payer: Medicare HMO | Admitting: Endocrinology

## 2020-08-07 ENCOUNTER — Other Ambulatory Visit: Payer: Self-pay

## 2020-08-07 VITALS — BP 140/80 | HR 108 | Ht 61.0 in | Wt 210.6 lb

## 2020-08-07 DIAGNOSIS — R200 Anesthesia of skin: Secondary | ICD-10-CM

## 2020-08-07 DIAGNOSIS — E1142 Type 2 diabetes mellitus with diabetic polyneuropathy: Secondary | ICD-10-CM | POA: Diagnosis not present

## 2020-08-07 LAB — TSH: TSH: 3.12 u[IU]/mL (ref 0.35–4.50)

## 2020-08-07 LAB — POCT GLYCOSYLATED HEMOGLOBIN (HGB A1C): Hemoglobin A1C: 6.2 % — AB (ref 4.0–5.6)

## 2020-08-07 LAB — T4, FREE: Free T4: 0.77 ng/dL (ref 0.60–1.60)

## 2020-08-07 LAB — VITAMIN B12: Vitamin B-12: 322 pg/mL (ref 211–911)

## 2020-08-07 MED ORDER — METFORMIN HCL 1000 MG PO TABS
500.0000 mg | ORAL_TABLET | Freq: Every day | ORAL | 1 refills | Status: DC
Start: 2020-08-07 — End: 2021-03-05

## 2020-08-07 NOTE — Patient Instructions (Addendum)
check your blood sugar once a day.  vary the time of day when you check, between before the 3 meals, and at bedtime.  also check if you have symptoms of your blood sugar being too high or too low.  please keep a record of the readings and bring it to your next appointment here (or you can bring the meter itself).  You can write it on any piece of paper.  please call us sooner if your blood sugar goes below 70, or if you have a lot of readings over 200.  Blood tests are requested for you today.  We'll let you know about the results.   Please come back for a follow-up appointment in 6 months.

## 2020-08-07 NOTE — Progress Notes (Signed)
Subjective:    Patient ID: [name], female    DOB: 1955-09-24, 65 y.o.   MRN: [id number]  HPI Pt returns for f/u of diabetes mellitus: DM type: 2 Dx'ed: 2637 Complications: PN Therapy: insulin since GDM: never DKA: never Severe hypoglycemia: never Pancreatitis: never Pancreatic imaging: never SDOH: none Other: she has never been on insulin; metformin dosage is limited by lightheadedness Interval history: She tolerate metformin well. She says cbg's are in the low-100's.  pt states she feels well in general.   Past Medical History:  Diagnosis Date  . Anesthesia complication    Hard to wake up past knee surgery in ? 2004!  Marland Kitchen Arthritis    knees  . Carpal tunnel syndrome of right wrist 08/2012  . Cataract   . Diabetes mellitus type 2, noninsulin dependent ([place])   . GERD (gastroesophageal reflux disease)   . History of breast cancer 04/2010   left  . Hyperlipidemia   . Hypertension    under control with med., has been on med. x 6-7 yr.  . Lesion of right ulnar nerve 08/2012  . S/P radiation therapy /last radiation treatment 07/2010 07/04/10 - 08/11/10   5040 cGy/28 Treatments to Left Breast  . Wears dentures    upper  . Wears partial dentures    lower    Past Surgical History:  Procedure Laterality Date  . CARPAL TUNNEL RELEASE Right 09/21/2012   Procedure: CARPAL TUNNEL RELEASE;  Surgeon: Cammie Sickle., MD;  Location: [place];  Service: Orthopedics;  Laterality: Right;  . CARPAL TUNNEL RELEASE Left 11/30/2012   Procedure: CARPAL TUNNEL RELEASE;  Surgeon: Cammie Sickle., MD;  Location: [place];  Service: Orthopedics;  Laterality: Left;  . CATARACT EXTRACTION     Bil/ right eye on 08/27/2015 and left eye on 09/17/2015 Dr Tommy Rainwater  . CHOLECYSTECTOMY  09/16/2005  . KNEE ARTHROSCOPY Left 08/11/2002  . PARTIAL MASTECTOMY WITH NEEDLE LOCALIZATION AND AXILLARY SENTINEL LYMPH NODE BX Left 05/21/2010   with re-exc. medial margin  lumpectomy site  . ROBOTIC ASSISTED LAPAROSCOPIC LYSIS OF ADHESION  09/17/2010  . ROBOTIC ASSISTED TOTAL HYSTERECTOMY WITH BILATERAL SALPINGO OOPHERECTOMY  09/17/2010  . SHOULDER OPEN ROTATOR CUFF REPAIR Right 03/29/2013   Procedure: ROTATOR CUFF REPAIR SHOULDER OPEN WITH SUBACROMIAL DECOMPRESSION AND DISTAL CLAVICLE RESECTION;  Surgeon: Cammie Sickle., MD;  Location: [place];  Service: Orthopedics;  Laterality: Right;  . ULNAR NERVE TRANSPOSITION Right 09/21/2012   Procedure: RIGHT IN SITU DECOMPRESSION ULNAR NERVE;  Surgeon: Cammie Sickle., MD;  Location: [place];  Service: Orthopedics;  Laterality: Right;    Social History   Socioeconomic History  . Marital status: Married    Spouse name: Not on file  . Number of children: Not on file  . Years of education: Not on file  . Highest education level: Not on file  Occupational History  . Not on file  Tobacco Use  . Smoking status: Former Smoker    Quit date: 05/07/2010    Years since quitting: 10.2  . Smokeless tobacco: Never Used  Vaping Use  . Vaping Use: Never used  Substance and Sexual Activity  . Alcohol use: No  . Drug use: No  . Sexual activity: Not Currently    Partners: Male    Comment: 1st intercoruse- 59, partners- 2, married- 59 yrs  Other Topics Concern  . 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 amitriptyline (ELAVIL) 100 MG tablet Take 300 mg by mouth at bedtime.     . famotidine (PEPCID) 40 MG tablet     . losartan (COZAAR) 100 MG tablet  Take 100 mg by mouth Daily.     Marland Kitchen lovastatin (MEVACOR) 10 MG tablet Take 10 mg by mouth at bedtime.    . ONE TOUCH ULTRA TEST test strip Daily.    . ranitidine (ZANTAC) 300 MG capsule Take 300 mg by mouth every evening.     Current Facility-Administered Medications on File Prior to Visit  Medication Dose Route Frequency Provider Last Rate Last Admin  . 0.9 %  sodium chloride infusion  500 mL Intravenous Continuous Armbruster, Carlota Raspberry, MD        Allergies  Allergen Reactions  . Adhesive [Tape] Other (See Comments)    BLISTERS  . Hydrocodone Nausea And Vomiting    Family History  Problem Relation Age of Onset  . Heart disease Mother   . Diabetes Mother   . Heart disease Father   . Diabetes Father   . Breast cancer Sister   . Diabetes Sister   . Colon cancer Sister   . Lung cancer Sister     BP 140/80 (BP Location: Right Arm, Patient Position: Sitting, Cuff Size: Large)   Pulse (!) 108   Ht 5\' 1"  (1.549 m)   Wt 210 lb 9.6 oz (95.5 kg)   SpO2 98%   BMI 39.79 kg/m    Review of Systems     Objective:   Physical Exam VITAL SIGNS:  See vs page GENERAL: no distress Pulses: dorsalis pedis intact bilat.   MSK: no deformity of the feet CV: 1+ bilat leg edema, and bilat vv's.   Skin:  no ulcer on the feet.  normal color and temp on the feet. Neuro: sensation is intact to touch on the feet  A1c=6.2%     Assessment & Plan:  Type 2 DM: well-controlled.    Patient Instructions  check your blood sugar once a day.  vary the time of day when you check, between before the 3 meals, and at bedtime.  also check if you have symptoms of your blood sugar being too high or too low.  please keep a record of the readings and bring it to your next appointment here (or you can bring the meter itself).  You can write it on any piece of paper.  please call us sooner if your blood sugar goes below 70, or if you have a lot of readings over 200.  Blood tests are requested for you today.  We'll  let you know about the results.   Please come back for a follow-up appointment in 6 months.

## 2020-09-10 ENCOUNTER — Ambulatory Visit (INDEPENDENT_AMBULATORY_CARE_PROVIDER_SITE_OTHER): Payer: Medicare HMO | Admitting: Cardiology

## 2020-09-10 ENCOUNTER — Encounter: Payer: Self-pay | Admitting: Cardiology

## 2020-09-10 ENCOUNTER — Other Ambulatory Visit: Payer: Self-pay

## 2020-09-10 VITALS — BP 151/84 | HR 111 | Ht 61.0 in | Wt 210.0 lb

## 2020-09-10 DIAGNOSIS — Z8249 Family history of ischemic heart disease and other diseases of the circulatory system: Secondary | ICD-10-CM | POA: Insufficient documentation

## 2020-09-10 DIAGNOSIS — E118 Type 2 diabetes mellitus with unspecified complications: Secondary | ICD-10-CM | POA: Diagnosis not present

## 2020-09-10 DIAGNOSIS — R000 Tachycardia, unspecified: Secondary | ICD-10-CM | POA: Diagnosis not present

## 2020-09-10 DIAGNOSIS — I10 Essential (primary) hypertension: Secondary | ICD-10-CM | POA: Diagnosis not present

## 2020-09-10 DIAGNOSIS — E785 Hyperlipidemia, unspecified: Secondary | ICD-10-CM

## 2020-09-10 DIAGNOSIS — E8881 Metabolic syndrome: Secondary | ICD-10-CM | POA: Insufficient documentation

## 2020-09-10 DIAGNOSIS — E1169 Type 2 diabetes mellitus with other specified complication: Secondary | ICD-10-CM | POA: Diagnosis not present

## 2020-09-10 DIAGNOSIS — R931 Abnormal findings on diagnostic imaging of heart and coronary circulation: Secondary | ICD-10-CM | POA: Insufficient documentation

## 2020-09-10 DIAGNOSIS — I2510 Atherosclerotic heart disease of native coronary artery without angina pectoris: Secondary | ICD-10-CM | POA: Insufficient documentation

## 2020-09-10 MED ORDER — LOVASTATIN 20 MG PO TABS: 20.0000 mg | ORAL_TABLET | Freq: Every day | ORAL | 3 refills | Status: DC

## 2020-09-10 MED ORDER — DILTIAZEM HCL ER COATED BEADS 180 MG PO CP24: 180.0000 mg | ORAL_CAPSULE | Freq: Every day | ORAL | 3 refills | Status: DC

## 2020-09-10 NOTE — Patient Instructions (Signed)
Medication Instructions:  Increase Lovastatin   20 mg  - one tablet daily   start Diltiazem 180 mg  One capsule  Daily   *If you need a refill on your cardiac medications before your next appointment, please call your pharmacy*   Lab Work: Not needed   Testing/Procedures:  CT coronary calcium score. This test is done at [address]. This is $99 out of pocket.   Coronary CalciumScan A coronary calcium scan is an imaging test used to look for deposits of calcium and other fatty materials (plaques) in the inner lining of the blood vessels of the heart (coronary arteries). These deposits of calcium and plaques can partly clog and narrow the coronary arteries without producing any symptoms or warning signs. This puts a person at risk for a heart attack. This test can detect these deposits before symptoms develop. Tell a health care provider about:  Any allergies you have.  All medicines you are taking, including vitamins, herbs, eye drops, creams, and over-the-counter medicines.  Any problems you or family members have had with anesthetic medicines.  Any blood disorders you have.  Any surgeries you have had.  Any medical conditions you have.  Whether you are pregnant or may be pregnant. What are the risks? Generally, this is a safe procedure. However, problems may occur, including:  Harm to a pregnant woman and her unborn baby. This test involves the use of radiation. Radiation exposure can be dangerous to a pregnant woman and her unborn baby. If you are pregnant, you generally should not have this procedure done.  Slight increase in the risk of cancer. This is because of the radiation involved in the test. What happens before the procedure? No preparation is needed for this procedure. What happens during the procedure?  You will undress and remove any jewelry around your neck or chest.  You will put on a hospital gown.  Sticky electrodes will be placed  on your chest. The electrodes will be connected to an electrocardiogram (ECG) machine to record a tracing of the electrical activity of your heart.  A CT scanner will take pictures of your heart. During this time, you will be asked to lie still and hold your breath for 2-3 seconds while a picture of your heart is being taken. The procedure may vary among health care providers and hospitals. What happens after the procedure?  You can get dressed.  You can return to your normal activities.  It is up to you to get the results of your test. Ask your health care provider, or the department that is doing the test, when your results will be ready. Summary  A coronary calcium scan is an imaging test used to look for deposits of calcium and other fatty materials (plaques) in the inner lining of the blood vessels of the heart (coronary arteries).  Generally, this is a safe procedure. Tell your health care provider if you are pregnant or may be pregnant.  No preparation is needed for this procedure.  A CT scanner will take pictures of your heart.  You can return to your normal activities after the scan is done. This information is not intended to replace advice given to you by your health care provider. Make sure you discuss any questions you have with your health care provider. Document Released: 10/11/2007 Document Revised: 03/03/2016 Document Reviewed: 03/03/2016 Elsevier Interactive Patient Education  2017 [employer].     Follow-Up: At [facility], you and your  health needs are our priority.  As part of our continuing mission to provide you with exceptional heart care, we have created designated Provider Care Teams.  These Care Teams include your primary Cardiologist (physician) and Advanced Practice Providers (APPs -  Physician Assistants and Nurse Practitioners) who all work together to provide you with the care you need, when you need it.  We recommend signing up for the patient  portal called "MyChart".  Sign up information is provided on this After Visit Summary.  MyChart is used to connect with patients for Virtual Visits (Telemedicine).  Patients are able to view lab/test results, encounter notes, upcoming appointments, etc.  Non-urgent messages can be sent to your provider as well.   To learn more about what you can do with MyChart, go to [email or web address].    Your next appointment:   1 month(s)  The format for your next appointment:   Virtual Visit   Provider:   Glenetta Hew, MD   Other Instructions

## 2020-09-10 NOTE — Progress Notes (Signed)
Primary Care Provider: Leonides Sake, MD Cardiologist: Glenetta Hew, MD Electrophysiologist: None  Clinic Note: Chief Complaint  Patient presents with  . New Patient (Initial Visit)    Coronary artery calcification on CT scan   ==================================  ASSESSMENT/PLAN   Problem List Items Addressed This Visit    Family history of premature coronary artery disease (Chronic)    Significant family history of CAD.  Her LDL is at 70, but with her weight gain, may be higher.  Increase lovastatin to 20 mg daily.  Checking coronary artery calcium score for baseline risk assessment.  Adding diltiazem for additional blood pressure control. Consider GLP 2 agonist for additional glycemic control and weight loss.      Relevant Orders   CT CARDIAC SCORING (SELF PAY ONLY)   EKG 12-Lead   Sinus tachycardia by electrocardiogram (Chronic)    Quite tachycardic.  This may be because of anxiety, but I think she does have resting bradycardia per her report.  Plan: Add diltiazem 180 mg daily.      Relevant Medications   diltiazem (CARDIZEM CD) 180 MG 24 hr capsule   Other Relevant Orders   CT CARDIAC SCORING (SELF PAY ONLY)   EKG 12-Lead   Hyperlipidemia associated with type 2 diabetes mellitus (HCC) (Chronic)    Most recent LDL was 70.  Her goal really should be 70 or below based on her significant risk factors of obesity, hypertension, hyperlipidemia, diabetes, prior prior smoker and family history.  Plan: Increase lovastatin to 20 mg daily.      Relevant Medications   lovastatin (MEVACOR) 20 MG tablet   Type 2 diabetes mellitus with complication, without long-term current use of insulin (HCC) (Chronic)    Mild neuropathy symptoms, but last A1c was 6.2.  Pretty well controlled on metformin.  May want to consider GLP-1 agonist or (potentially higher dose) for diabetes and weight loss benefit.      Relevant Medications   lovastatin (MEVACOR) 20 MG tablet   Coronary  artery calcification seen on CAT scan - Primary (Chronic)    Relatively asymptomatic besides some exertional dyspnea from obesity.  She walks routinely with no chest pain or pressure.  No indication for stress test, however since she has had a lung cancer screening CT scan, we should proceed with Coronary Calcium Score CT to Risk Stratify.  Plan: Coronary Calcium Score  Recommend increasing lovastatin to 20 mg nightly        Relevant Medications   lovastatin (MEVACOR) 20 MG tablet   diltiazem (CARDIZEM CD) 180 MG 24 hr capsule   Other Relevant Orders   CT CARDIAC SCORING (SELF PAY ONLY)   EKG 66-YQIH   Metabolic syndrome (Chronic)    Obesity, hypertension, hyperlipidemia and diabetes all are components of metabolic syndrome.  We discussed her increased risk which in conjunction with her previous smoking history and a significant family history of CAD warrants Risk Stratification with Coronary Calcium Score.      Relevant Orders   CT CARDIAC SCORING (SELF PAY ONLY)   EKG 12-Lead   Essential hypertension (Chronic)    Her blood pressure is quite high today, but she is very anxious.  This may also be contributing somewhat to her tachycardia.  But she says her heart rate is always usually fast.   Plan: We will add diltiazem 180 mg daily and reassess in follow-up.  Continue losartan.      Relevant Medications   lovastatin (MEVACOR) 20 MG tablet  diltiazem (CARDIZEM CD) 180 MG 24 hr capsule   Morbid obesity ([place]) (Chronic)    Needs to work on diet and exercise.  Discussed importance of controlling her obesity leading to ease of controlling hypertension, hyperlipidemia and diabetes.  She is walking 3 days a week, needs to adjust diet.      Relevant Orders   CT CARDIAC SCORING (SELF PAY ONLY)      ===================================  HPI:    [name] is a Morbidly Obese  65 y.o. female smoker with Cardiac Risk Factors of DM-2, HTN, HLD (Metabolic Syndrome) with  significant Family History of CAD who is being seen today for the evaluation of evidence of Aortic, Great Vessel and Coronary Artery Atherosclerosis noted on CT scan at the request of Hamrick, Lorin Mercy, MD.  Zackery Barefoot was seen by Dr. Lisbeth Ply on June 06, 2020 for routine follow-up of diabetes, hypertension, hyperlipidemia and obesity.  A1c was noted to be 6.2.  She did note some mild neuropathy symptoms in her feet.  Blood pressure was at goal on standing dose of losartan.  LDL previously documented at 70 on low-dose lovastatin.  Unfortunately weight was up 8 pounds since last year because of no routine exercise.  Recent Hospitalizations: None  Reviewed  CV studies:    The following studies were reviewed today: (if available, images/films reviewed: From Epic Chart or Care Everywhere)  CT Chest-Lung Cancer Screening 07/11/2020: Lung RADS 2S (benign appearance.  Aortic and great vessel atherosclerosis in addition to left main and three-vessel coronary artery atherosclerosis/coronary calcification.  Mild diffuse bronchial wall thickening with very mild centrilobular and paraseptal emphysema suggestive of underlying COPD.  Hepatic steatosis.  Interval History:   [name]   CV Review of Symptoms (Summary): positive for - dyspnea on exertion and Mostly related to deconditioning and obesity.  She walks routinely, and notes exertional dyspnea going uphill or rapidly upstairs. negative for - chest pain, edema, irregular heartbeat, orthopnea, palpitations, paroxysmal nocturnal dyspnea, rapid heart rate, shortness of breath or Lightheadedness or dizziness or wooziness, syncope/near syncope or TIA/amaurosis fugax, claudication  REVIEWED OF SYSTEMS   Review of Systems  Constitutional: Negative for malaise/fatigue and weight loss.  HENT: Negative for nosebleeds.   Respiratory: Positive for shortness of breath (Deconditioned.  Therefore she will get short of breath if she overexerts.). Negative  for cough.   Gastrointestinal: Negative for blood in stool, diarrhea and melena.  Genitourinary: Negative for frequency and hematuria.  Musculoskeletal: Positive for back pain. Negative for joint pain.  Neurological: Negative for dizziness and focal weakness.  Psychiatric/Behavioral: Negative for memory loss. The patient is not nervous/anxious and does not have insomnia.    I have reviewed and (if needed) personally updated the patient's problem list, medications, allergies, past medical and surgical history, social and family history.   PAST MEDICAL HISTORY   Past Medical History:  Diagnosis Date  . Anesthesia complication    Hard to wake up past knee surgery in ? 2004!  Marland Kitchen Arthritis    knees  . Carpal tunnel syndrome of right wrist 08/2012  . Cataract   . Coronary artery calcification seen on CT scan 07/11/2020   Lung Cancer screening CT Chest: Aortic, great vessel and three-vessel coronary atherosclerosis noted.  . Diabetes mellitus type 2, noninsulin dependent ([place])    On metformin alone.  A1c 6.2 (February 2022)  . GERD (gastroesophageal reflux disease)   . History of breast cancer 2011   s/p L lobectomy in 2012  as well as radiation with letrozole x5 years.  (Dr. Jana Hakim)  . Hyperlipidemia   . Hypertension    under control with med., has been on med. x 6-7 yr.  . Lesion of right ulnar nerve 08/2012  . S/P radiation therapy /last radiation treatment 07/2010 07/04/10 - 08/11/10   5040 cGy/28 Treatments to Left Breast  . Wears dentures    upper  . Wears partial dentures    lower    PAST SURGICAL HISTORY   Past Surgical History:  Procedure Laterality Date  . CARPAL TUNNEL RELEASE Right 09/21/2012   Procedure: CARPAL TUNNEL RELEASE;  Surgeon: Cammie Sickle., MD;  Location: [facility];  Service: Orthopedics;  Laterality: Right;  . CARPAL TUNNEL RELEASE Left 11/30/2012   Procedure: CARPAL TUNNEL RELEASE;  Surgeon: Cammie Sickle., MD;  Location: [facility];  Service: Orthopedics;  Laterality: Left;  . CATARACT EXTRACTION Bilateral 2017   Bil/ right eye on 08/27/2015 and left eye on 09/17/2015 Dr Tommy Rainwater  . CHOLECYSTECTOMY  09/16/2005  . KNEE ARTHROSCOPY Left 08/11/2002  . PARTIAL MASTECTOMY WITH NEEDLE LOCALIZATION AND AXILLARY SENTINEL LYMPH NODE BX Left 05/21/2010   with re-exc. medial margin lumpectomy site  . ROBOTIC ASSISTED LAPAROSCOPIC LYSIS OF ADHESION  09/17/2010  . ROBOTIC ASSISTED TOTAL HYSTERECTOMY WITH BILATERAL SALPINGO OOPHERECTOMY  09/17/2010  . SHOULDER OPEN ROTATOR CUFF REPAIR Right 03/29/2013   Procedure: ROTATOR CUFF REPAIR SHOULDER OPEN WITH SUBACROMIAL DECOMPRESSION AND DISTAL CLAVICLE RESECTION;  Surgeon: Cammie Sickle., MD;  Location: [facility];  Service: Orthopedics;  Laterality: Right;  . ULNAR NERVE TRANSPOSITION Right 09/21/2012   Procedure: RIGHT IN SITU DECOMPRESSION ULNAR NERVE;  Surgeon: Cammie Sickle., MD;  Location: [facility];  Service: Orthopedics;  Laterality: Right;    Immunization History  Administered Date(s) Administered  . Influenza-Unspecified 02/09/2020  . PFIZER Comirnaty([place])Covid-19 Tri-Sucrose Vaccine 02/09/2020  . Pneumococcal Conjugate-13 06/06/2020  . Zoster Recombinat (Shingrix) 05/09/2020    MEDICATIONS/ALLERGIES   Current Meds  Medication Sig  . amitriptyline (ELAVIL) 100 MG tablet Take 300 mg by mouth at bedtime.   Marland Kitchen diltiazem (CARDIZEM CD) 180 MG 24 hr capsule Take 1 capsule (180 mg total) by mouth daily.  . famotidine (PEPCID) 40 MG tablet   . losartan (COZAAR) 100 MG tablet Take 100 mg by mouth Daily.   Marland Kitchen lovastatin (MEVACOR) 20 MG tablet Take 1 tablet (20 mg total) by mouth at bedtime.  . metFORMIN (GLUCOPHAGE) 1000 MG tablet Take 0.5 tablets (500 mg total) by mouth daily with breakfast.  . ONE TOUCH ULTRA TEST test strip Daily.  . [DISCONTINUED] lovastatin (MEVACOR) 10 MG tablet Take 10 mg by mouth at bedtime.  .  [DISCONTINUED] ranitidine (ZANTAC) 300 MG capsule Take 300 mg by mouth every evening.   Current Facility-Administered Medications for the 09/10/20 encounter (Office Visit) with Leonie Man, MD  Medication  . 0.9 %  sodium chloride infusion    Allergies  Allergen Reactions  . Adhesive [Tape] Other (See Comments)    BLISTERS  . Hydrocodone Nausea And Vomiting    SOCIAL HISTORY/FAMILY HISTORY   Reviewed in Epic:  Pertinent findings:  Social History   Tobacco Use  . Smoking status: Former Smoker    Quit date: 05/07/2010    Years since quitting: 10.4  . Smokeless tobacco: Never Used  Vaping Use  . Vaping Use: Never used  Substance Use Topics  . Alcohol use: No  .  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   Exercises 3 to 4 days a week    OBJCTIVE -PE, EKG, labs   Wt Readings from Last 3 Encounters:  09/10/20 210 lb (95.3 kg)  08/07/20 210 lb 9.6 oz (95.5 kg)  03/28/20 200 lb 6.4 oz (90.9 kg)  -PCP visit February 2022: 208 pounds.  BMI 40.62.   Physical Exam: BP (!) 151/84   Pulse (!) 111   Ht 5\' 1"  (1.549 m)   Wt 210 lb (95.3 kg)   SpO2 100%   BMI 39.68 kg/m  Physical Exam Vitals reviewed.  Constitutional:      General: She is not in acute distress.    Appearance: Normal appearance. She is not ill-appearing (Healthy-appearing.  Well-groomed.) or toxic-appearing.     Comments: Morbidly obese  HENT:     Head: Normocephalic and atraumatic.  Neck:     Vascular: No carotid bruit or JVD (Unable to assess).  Cardiovascular:     Rate and Rhythm: Regular rhythm. Tachycardia present.  No extrasystoles are present.    Chest Wall: PMI is not displaced (Unable to palpate).     Pulses: Normal pulses.     Heart sounds: S1 normal and S2 normal. Heart sounds are distant. No murmur heard. No friction rub. No gallop.   Pulmonary:     Effort:  Pulmonary effort is normal. No respiratory distress.     Breath sounds: No wheezing, rhonchi or rales.     Comments: Distant breath sounds Chest:     Chest wall: No tenderness.  Abdominal:     General: Bowel sounds are normal. There is no distension.     Palpations: Abdomen is soft. There is no mass.     Tenderness: There is no abdominal tenderness.     Comments: Obese; unable assess HSM.  Musculoskeletal:        General: No swelling (Difficult to tell if swallowing versus body habitus.). Normal range of motion.     Cervical back: Normal range of motion and neck supple.  Skin:    General: Skin is warm.  Neurological:     General: No focal deficit present.     Mental Status: She is alert and oriented to person, place, and time.  Psychiatric:        Mood and Affect: Mood normal.        Behavior: Behavior normal.        Thought Content: Thought content normal.        Judgment: Judgment normal.   2  Adult ECG Report  Rate: 111 ;  Rhythm: sinus tachycardia and Borderline low voltage otherwise normal axis, intervals and durations.;   Narrative Interpretation: Other than tachycardia, normal  Recent Labs:    06/06/2020: A1c 6.2, TC 148, TG 225, HDL 41, LDL 70.   Na+ 145, K+ 5.2, Cl- 104, HCO3-24, BUN 14, Cr 1.02, Glu 86, Ca2+ 9.5; AST 23, ALT 29, AlkP 133  Lab Results  Component Value Date   TSH 3.12 08/07/2020    ==================================================  COVID-19 Education: The signs and symptoms of COVID-19 were discussed with the patient and how to seek care for testing (follow up with PCP or arrange E-visit).    I spent a total of 55minutes with the patient spent in direct patient consultation.  Additional  time spent with chart review  / charting (studies, outside notes, etc): 18 min Total Time: 39 min   Current medicines are reviewed at length with the patient today.  (+/- concerns) n/a  This visit occurred during the SARS-CoV-2 public health emergency.  Safety  protocols were in place, including screening questions prior to the visit, additional usage of staff PPE, and extensive cleaning of exam room while observing appropriate contact time as indicated for disinfecting solutions.  Notice: This dictation was prepared with Dragon dictation along with smaller phrase technology. Any transcriptional errors that result from this process are unintentional and may not be corrected upon review.  Patient Instructions / Medication Changes & Studies & Tests Ordered   Patient Instructions  Medication Instructions:  Increase Lovastatin   20 mg  - one tablet daily   start Diltiazem 180 mg  One capsule  Daily   *If you need a refill on your cardiac medications before your next appointment, please call your pharmacy*   Lab Work: Not needed   Testing/Procedures:  CT coronary calcium score. This test is done at [address]. This is $99 out of pocket.   We recommend signing up for the patient portal called "MyChart".  Sign up information is provided on this After Visit Summary.  MyChart is used to connect with patients for Virtual Visits (Telemedicine).  Patients are able to view lab/test results, encounter notes, upcoming appointments, etc.  Non-urgent messages can be sent to your provider as well.   To learn more about what you can do with MyChart, go to [email or web address].    Follow-Up:    Your next appointment:   1 month(s)  The format for your next appointment:   Virtual Visit   Provider:   Glenetta Hew, MD   Other Instructions    Studies Ordered:   Orders Placed This Encounter  Procedures  . CT CARDIAC SCORING (SELF PAY ONLY)  . EKG 12-Lead     Glenetta Hew, M.D., M.S. Interventional Cardiologist   Pager # [phone] Phone # [phone] [address] [place], [place] [address]   Thank you for choosing [facility]!!

## 2020-09-11 ENCOUNTER — Telehealth: Payer: Self-pay | Admitting: Cardiology

## 2020-09-11 NOTE — Telephone Encounter (Signed)
    Pt c/o medication issue:  1. Name of Medication: lovastatin (MEVACOR) 20 MG tablet  2. How are you currently taking this medication (dosage and times per day)? Take 1 tablet (20 mg total) by mouth at bedtime.  3. Are you having a reaction (difficulty breathing--STAT)?   4. What is your medication issue? Pt saw Dr. Ellyn Hack yesterday and she said Dr. Ellyn Hack wanted to increase her lovastatin 10 mg to 20 mg. However, when she got home she noticed that she's been taking 20 mg 1 tablet a day before bedtime. She's not sure if she just need to continue taking the 20 mg or if Dr. Ellyn Hack wants to increase it to a higher dose

## 2020-09-11 NOTE — Telephone Encounter (Signed)
Advised patient, verbalized understanding   Stay on 20 mg for now.    Glenetta Hew, MD

## 2020-09-11 NOTE — Telephone Encounter (Signed)
Will forward to Dr Ellyn Hack for review

## 2020-09-17 DIAGNOSIS — E669 Obesity, unspecified: Secondary | ICD-10-CM | POA: Diagnosis not present

## 2020-09-17 DIAGNOSIS — E785 Hyperlipidemia, unspecified: Secondary | ICD-10-CM | POA: Diagnosis not present

## 2020-09-17 DIAGNOSIS — Z1331 Encounter for screening for depression: Secondary | ICD-10-CM | POA: Diagnosis not present

## 2020-09-17 DIAGNOSIS — Z0000 Encounter for general adult medical examination without abnormal findings: Secondary | ICD-10-CM | POA: Diagnosis not present

## 2020-09-17 DIAGNOSIS — Z9181 History of falling: Secondary | ICD-10-CM | POA: Diagnosis not present

## 2020-10-03 ENCOUNTER — Encounter: Payer: Self-pay | Admitting: Cardiology

## 2020-10-03 DIAGNOSIS — E785 Hyperlipidemia, unspecified: Secondary | ICD-10-CM | POA: Insufficient documentation

## 2020-10-03 NOTE — Assessment & Plan Note (Addendum)
Needs to work on diet and exercise.  Discussed importance of controlling her obesity leading to ease of controlling hypertension, hyperlipidemia and diabetes.  She is walking 3 days a week, needs to adjust diet.

## 2020-10-03 NOTE — Assessment & Plan Note (Signed)
Relatively asymptomatic besides some exertional dyspnea from obesity.  She walks routinely with no chest pain or pressure.  No indication for stress test, however since she has had a lung cancer screening CT scan, we should proceed with Coronary Calcium Score CT to Risk Stratify.  Plan: Coronary Calcium Score  Recommend increasing lovastatin to 20 mg nightly

## 2020-10-03 NOTE — Assessment & Plan Note (Addendum)
Her blood pressure is quite high today, but she is very anxious.  This may also be contributing somewhat to her tachycardia.  But she says her heart rate is always usually fast.   Plan: We will add diltiazem 180 mg daily and reassess in follow-up.  Continue losartan.

## 2020-10-03 NOTE — Assessment & Plan Note (Signed)
Most recent LDL was 70.  Her goal really should be 70 or below based on her significant risk factors of obesity, hypertension, hyperlipidemia, diabetes, prior prior smoker and family history.  Plan: Increase lovastatin to 20 mg daily.

## 2020-10-03 NOTE — Assessment & Plan Note (Signed)
Obesity, hypertension, hyperlipidemia and diabetes all are components of metabolic syndrome.  We discussed her increased risk which in conjunction with her previous smoking history and a significant family history of CAD warrants Risk Stratification with Coronary Calcium Score.

## 2020-10-03 NOTE — Assessment & Plan Note (Signed)
Significant family history of CAD.  Her LDL is at 70, but with her weight gain, may be higher.  Increase lovastatin to 20 mg daily.  Checking coronary artery calcium score for baseline risk assessment.  Adding diltiazem for additional blood pressure control. Consider GLP 2 agonist for additional glycemic control and weight loss.

## 2020-10-03 NOTE — Assessment & Plan Note (Signed)
Mild neuropathy symptoms, but last A1c was 6.2.  Pretty well controlled on metformin.  May want to consider GLP-1 agonist or (potentially higher dose) for diabetes and weight loss benefit.

## 2020-10-03 NOTE — Assessment & Plan Note (Signed)
Quite tachycardic.  This may be because of anxiety, but I think she does have resting bradycardia per her report.  Plan: Add diltiazem 180 mg daily.

## 2020-10-10 DIAGNOSIS — Z803 Family history of malignant neoplasm of breast: Secondary | ICD-10-CM | POA: Diagnosis not present

## 2020-10-10 DIAGNOSIS — M85851 Other specified disorders of bone density and structure, right thigh: Secondary | ICD-10-CM | POA: Diagnosis not present

## 2020-10-10 DIAGNOSIS — Z1231 Encounter for screening mammogram for malignant neoplasm of breast: Secondary | ICD-10-CM | POA: Diagnosis not present

## 2020-10-10 DIAGNOSIS — M85852 Other specified disorders of bone density and structure, left thigh: Secondary | ICD-10-CM | POA: Diagnosis not present

## 2020-10-11 ENCOUNTER — Other Ambulatory Visit: Payer: Self-pay

## 2020-10-11 ENCOUNTER — Ambulatory Visit (INDEPENDENT_AMBULATORY_CARE_PROVIDER_SITE_OTHER)
Admission: RE | Admit: 2020-10-11 | Discharge: 2020-10-11 | Disposition: A | Discharge status: Home / Self Care | Payer: Self-pay | Source: Ambulatory Visit | Attending: Cardiology | Admitting: Cardiology

## 2020-10-11 DIAGNOSIS — I2510 Atherosclerotic heart disease of native coronary artery without angina pectoris: Secondary | ICD-10-CM

## 2020-10-11 DIAGNOSIS — R000 Tachycardia, unspecified: Secondary | ICD-10-CM

## 2020-10-11 DIAGNOSIS — E8881 Metabolic syndrome: Secondary | ICD-10-CM

## 2020-10-11 DIAGNOSIS — Z8249 Family history of ischemic heart disease and other diseases of the circulatory system: Secondary | ICD-10-CM

## 2020-10-15 ENCOUNTER — Encounter: Payer: Self-pay | Admitting: Anesthesiology

## 2020-10-16 ENCOUNTER — Telehealth: Payer: Self-pay | Admitting: *Deleted

## 2020-10-16 NOTE — Telephone Encounter (Signed)
-----   Message from Leonie Man, MD sent at 10/14/2020  2:16 PM EDT ----- Coronary artery calcium score is relatively high at 625.  This is 72 percentile, and a pretty strong predictor of having coronary artery disease.  Recommendation is to be aggressive with cholesterol management trying to lower LDL to less than 70, and initiate aspirin 81 mg daily. Close blood pressure and blood glucose management also recommended.  Diet, exercise and weight loss will help with both this.  If there are symptoms of exertional chest discomfort or shortness of breath, we would consider either coronary CT angiogram or stress testing.Glenetta Hew, MD

## 2020-10-16 NOTE — Telephone Encounter (Signed)
The patient has been notified of the result and verbalized understanding.  All questions (if any) were answered. Patient is aware to keep virtual appointment 10/23/20. RN gave instruction on how virtual vist will proceed . Patient voiced understanding. Patient states she is already taking  76 Mg Asprin daily   [name] 10/16/2020 4:38 PM

## 2020-10-23 ENCOUNTER — Telehealth: Payer: Medicare HMO | Admitting: Cardiology

## 2020-10-25 DIAGNOSIS — C50912 Malignant neoplasm of unspecified site of left female breast: Secondary | ICD-10-CM | POA: Diagnosis not present

## 2020-10-29 ENCOUNTER — Encounter: Payer: Self-pay | Admitting: Cardiology

## 2020-10-29 NOTE — Progress Notes (Signed)
Primary Care Provider: Leonides Sake, MD Cardiologist: Glenetta Hew, MD Electrophysiologist: None  Clinic Note: Chief Complaint  Patient presents with   Follow-up    Test results-coronary calcium score; no chest pain or exertional dyspnea beyond baseline.   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  Recommendation is aggressive risk factor modification.  Target LDL less than 70, strict blood pressure and glycemic control.  Diet/exercise and weight loss.  No need for ischemic evaluation unless her symptoms warrant..  She denies any chest pain or pressure with rest or exertion.  No heart failure symptoms.  No changes to exertional dyspnea related to deconditioning.  She is now walking up to a mile and a half a day and doing well.  We now have baseline level activity.  Plan:  Convert from diltiazem to beta-blocker, Continue current dose of statin but recheck lipid panel-titrate as necessary. Continue aspirin 81 mg daily. ->  Okay to hold 5 days preop for surgeries, procedures, colonoscopies etc.       Relevant Medications   aspirin EC 81 MG tablet   losartan (COZAAR) 50 MG tablet   metoprolol succinate (TOPROL-XL) 50 MG 24 hr tablet   Other Relevant Orders   Lipid panel   Comprehensive metabolic panel   Family history of premature coronary artery disease (Chronic)    She clearly has CAD, but with lack of symptoms, likely nonobstructive at this point.  Calcium score of 600 is relatively high risk. ->  As long as she is able to continue doing her level of exercise, no need for ischemic evaluation, just aggressive risk factor modification..       Relevant Orders   Lipid panel   Comprehensive metabolic panel   Sinus tachycardia by electrocardiogram (Chronic)    She remains tachycardic despite  being on diltiazem.  With her having constipation on diltiazem, will switch to beta-blocker, especially in light of her having documented nonobstructive CAD.  I have asked that she monitor her heart rates as well as blood pressure..       Hyperlipidemia associated with type 2 diabetes mellitus ([place]) (Chronic)    Labs as of February had an LDL of 70.  That is on modest dose of lovastatin with-relatively weak statin.  Plan:  Recheck lipids-low threshold to consider titrating to a more potent statin versus increasing the dose. She is on metformin-with obesity, consider GLP-1 agonist.       Relevant Medications   aspirin EC 81 MG tablet   losartan (COZAAR) 50 MG tablet   Other Relevant Orders   Lipid panel   Comprehensive metabolic panel   Type 2 diabetes mellitus with complication, without long-term current use of insulin (HCC) (Chronic)   Relevant Medications   aspirin EC 81 MG tablet   losartan (COZAAR) 50 MG tablet   Other Relevant Orders   Lipid panel   Comprehensive metabolic panel   Metabolic syndrome (Chronic)    Not unexpectedly with metabolic syndrome, her coronary calcium score is elevated.  She clearly is at high risk for developing obstructive CAD.  Aggressive risk factor modification as noted: Weight loss, glycemic/lipid and blood pressure control.  Consider GLP-1 agonist to help with glycemic control and weight loss.       Relevant Orders   Lipid panel   Comprehensive metabolic panel   Essential hypertension -  Primary (Chronic)    Blood pressure log exercising pretty good evening she has had some low blood pressures.  Her pressure today is 140/80 but she is little anxious to hear the results of her test.    Will plan to convert from Diltiazem to Metoprolol -- decrease Losartan while we make the change.  She will continue to keep a blood pressure log so we can determine if we need to titrate up the dose.  We will reduce losartan to 50 mg daily and start Toprol  50 mg for rate control and blood pressure.       Relevant Medications   aspirin EC 81 MG tablet   losartan (COZAAR) 50 MG tablet   metoprolol succinate (TOPROL-XL) 50 MG 24 hr tablet   Morbid obesity (HCC) (Chronic)    She is definitely working on her exercise.  We talked about dietary modification.  Goal will be to lose roughly 2 pounds per month over the next year.       Relevant Orders   Lipid panel   Comprehensive metabolic panel    ===================================  HPI:    [name] is a morbidly obese 65 y.o. female with a PMH below who is being seen today for follow-up evaluation of CORONARY ARTERY CALCIFICATION ON CT with CRFs of Fam Hx, DM-2, HTN & HLD (Metabolic Syndrome).    [name] was last seen on Sep 10, 2020 at the request of Hamrick, Maura L, MD.  She referred mostly because of exertional dyspnea-mostly related to obesity and deconditioning.  Short of breath going up hills rapidly.  No chest pain or pressure.  She denied CHF symptoms. Given the lack of symptoms, we chose to do baseline screening evaluation with Coronary Calcium Score evaluation Also increase lovastatin to 20 mg daily and start diltiazem 180 mg daily for hypertension and tachycardia.  Recent Hospitalizations: N/A  Reviewed  CV studies:    The following studies were reviewed today: (if available, images/films reviewed: From Epic Chart or Care Everywhere) Coronary Calcium Score: Score is 625.  LAD-204, LCx 227, RCA 195.  Interval History:   [name] returns here today to discuss the results of her coronary calcium score.  Thankfully, she really does not have that much in the way of any significant symptoms.  She acknowledges being obese and deconditioned so she has exertional dyspnea but she has no symptoms of chest tightness or pressure with rest or exertion.  No real prominent exertional dyspnea unless she tries to walk up stairs rapidly, walks longer distances or tries to go  too fast.  She does not have any symptoms of palpitations or tachycardia.  This despite the fact that her heart rate is 103 bpm.  She did not really notice any change with being on diltiazem, in fact she says her blood pressures may be minimal but higher.  Infection noted on diltiazem she felt constipation.   She is really making a concerted effort trying to walk just about every morning for a mile to mile and a half.  Obviously, she is walking somewhat slowly, get an extended release.  She is noting that the more she is able to do, the less short of breath she gets.  Daily Tylenol also  She is taking her aspirin and does note some easy bruising, bleeding.  No significant bleeding noted.  She has an upcoming colonoscopy and asked what she needs to do with the aspirin.  She thinks that she probably needs  preop clearance.  Thankfully, despite having an elevated coronary calcium score, she is pretty asymptomatic.  CV Review of Symptoms (Summary) Cardiovascular ROS: positive for - Exertional dyspnea related to deconditioning. negative for - chest pain, edema, irregular heartbeat, orthopnea, palpitations, paroxysmal nocturnal dyspnea, rapid heart rate, shortness of breath, or although she sometimes get little lightheaded when breathing hard, she denies any syncope or near syncope, no TIA/hemispheres or claudication.  REVIEWED OF SYSTEMS   Review of Systems  Constitutional:  Negative for malaise/fatigue (She feels like she has good energy, just is deconditioned from her weight.  Is hoping to be more active now that she knows that her cardiac risk is higher.) and weight loss.  HENT:  Negative for nosebleeds.   Respiratory:  Negative for cough and shortness of breath (Only with notable exertion).   Cardiovascular:        Per HPI  Gastrointestinal:  Positive for constipation (Noticed when she started diltiazem). Negative for blood in stool and melena.  Genitourinary:  Negative for hematuria.   Musculoskeletal:  Positive for back pain and joint pain. Negative for falls and myalgias.  Neurological:  Positive for dizziness. Negative for focal weakness, weakness and headaches.  Endo/Heme/Allergies:  Bruises/bleeds easily.  Psychiatric/Behavioral:  Negative for depression and memory loss. The patient does not have insomnia.     I have reviewed and (if needed) personally updated the patient's problem list, medications, allergies, past medical and surgical history, social and family history.   PAST MEDICAL HISTORY   Past Medical History:  Diagnosis Date   Agatston coronary artery calcium score greater than 400 07/11/2020   CAC Score 625.  LAD 204, LCx 227, RCA 195 (96th %-ile)   Anesthesia complication    Hard to wake up past knee surgery in ? 2004!   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  A1c 6.2 (February 2022)   GERD (gastroesophageal reflux disease)    History of breast cancer 2011   s/p L lobectomy in 2012 as well as radiation with letrozole x5 years.  (Dr. Jana Hakim)   Hyperlipidemia    Hypertension    under control with med., has been on med. x 6-7 yr.   Lesion of right ulnar nerve 08/2012   S/P radiation therapy /last radiation treatment 07/2010 07/04/10 - 08/11/10   5040 cGy/28 Treatments to Left Breast   Wears dentures    upper   Wears partial dentures    lower    PAST SURGICAL HISTORY   Past Surgical History:  Procedure Laterality Date   CARPAL TUNNEL RELEASE Right 09/21/2012   Procedure: CARPAL TUNNEL RELEASE;  Surgeon: Cammie Sickle., MD;  Location: [facility];  Service: Orthopedics;  Laterality: Right;   CARPAL TUNNEL RELEASE Left 11/30/2012   Procedure: CARPAL TUNNEL RELEASE;  Surgeon: Cammie Sickle., MD;  Location: [facility];  Service: Orthopedics;  Laterality: Left;   CATARACT EXTRACTION Bilateral  2017   Bil/ right eye on 08/27/2015 and left eye on 09/17/2015 Dr Tommy Rainwater   CHOLECYSTECTOMY  09/16/2005   KNEE ARTHROSCOPY Left 08/11/2002   PARTIAL MASTECTOMY WITH NEEDLE LOCALIZATION AND AXILLARY SENTINEL LYMPH NODE BX Left 05/21/2010   with re-exc. medial margin lumpectomy site   ROBOTIC ASSISTED LAPAROSCOPIC LYSIS OF ADHESION  09/17/2010   ROBOTIC ASSISTED TOTAL HYSTERECTOMY WITH BILATERAL SALPINGO OOPHERECTOMY  09/17/2010   SHOULDER OPEN ROTATOR CUFF REPAIR Right 03/29/2013   Procedure: ROTATOR CUFF REPAIR SHOULDER OPEN WITH SUBACROMIAL DECOMPRESSION AND DISTAL CLAVICLE RESECTION;  Surgeon: Cammie Sickle., MD;  Location: [facility];  Service: Orthopedics;  Laterality: Right;   ULNAR NERVE TRANSPOSITION Right 09/21/2012   Procedure: RIGHT IN SITU DECOMPRESSION ULNAR NERVE;  Surgeon: Cammie Sickle., MD;  Location: [facility];  Service: Orthopedics;  Laterality: Right;    Immunization History  Administered Date(s) Administered   Influenza-Unspecified 02/09/2020   PFIZER Comirnaty(Gray Top)Covid-19 Tri-Sucrose Vaccine 02/09/2020   Pneumococcal Conjugate-13 06/06/2020   Zoster Recombinat (Shingrix) 05/09/2020    MEDICATIONS/ALLERGIES   Current Meds  Medication Sig   amitriptyline (ELAVIL) 100 MG tablet Take 300 mg by mouth at bedtime.    aspirin EC 81 MG tablet Take 81 mg by mouth daily. Swallow whole.   famotidine (PEPCID) 40 MG tablet    losartan (COZAAR) 50 MG tablet Take 1 tablet (100 mg total) by mouth every morning.   lovastatin (MEVACOR) 20 MG tablet Take 1 tablet (20 mg total) by mouth at bedtime.   metFORMIN (GLUCOPHAGE) 1000 MG tablet Take 0.5 tablets (500 mg total) by mouth daily with breakfast.   metoprolol succinate (TOPROL-XL) 50 MG 24 hr tablet Take 1 tablet (50 mg total) by mouth every evening. Take with or immediately following a meal.   ONE TOUCH ULTRA TEST test strip Daily.   [diltiazem (CARDIZEM CD) 180 MG 24 hr capsule Take 1  capsule (180 mg total) by mouth daily.   Current Facility-Administered Medications for the 10/30/20 encounter (Office Visit) with Leonie Man, MD  Medication   0.9 %  sodium chloride infusion    Allergies  Allergen Reactions   Adhesive [Tape] Other (See Comments)    BLISTERS   Hydrocodone Nausea And Vomiting   Diltiazem Other (See Comments)    constipation    SOCIAL HISTORY/FAMILY HISTORY   Reviewed in Epic:  Pertinent findings:  Social History   Tobacco Use   Smoking status: Former    Pack years: 0.00    Types: Cigarettes    Quit date: 05/07/2010    Years since quitting: 10.5   Smokeless tobacco: Never  Vaping Use   Vaping Use: Never used  Substance Use Topics   Alcohol use: No   Drug use: No   Social History   Social History Narrative   Married   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   Exercises 3 to 4 days a week    OBJCTIVE -PE, EKG, labs   Wt Readings from Last 3 Encounters:  10/30/20 207 lb 6.4 oz (94.1 kg)  09/10/20 210 lb (95.3 kg)  08/07/20 210 lb 9.6 oz (95.5 kg)    Physical Exam: BP 140/80 (BP Location: Left Arm, Patient Position: Sitting, Cuff Size: Large)   Pulse (!) 103   Ht 5' (1.524 m)   Wt 207 lb 6.4 oz (94.1 kg)   SpO2 99%   BMI 40.51 kg/m  Blood pressure log over the last 2 weeks: Low blood pressure 97/78 mmHg-heart rate 111 bpm.  High blood pressure 142/91 mmHg with a heart rate of 103 bpm.  Average blood pressures been in the 120 -129 mmHg/73 - 82 mmHg.  Physical Exam Vitals reviewed.  Constitutional:      General: She is not in acute distress.    Appearance: Normal  appearance. She is obese. She is not toxic-appearing.     Comments: Well-groomed.  Healthy-appearing.  HENT:     Head: Normocephalic and atraumatic.  Neck:     Vascular: No carotid bruit.     Comments: Unable assess JVD Cardiovascular:     Rate and Rhythm: Normal rate and regular rhythm. No extrasystoles are  present.    Chest Wall: PMI is not displaced (Unable to assess).     Pulses: Normal pulses.     Heart sounds: S1 normal and S2 normal. Heart sounds are distant. No murmur heard.   No friction rub. No gallop.  Pulmonary:     Effort: Pulmonary effort is normal. No respiratory distress.     Breath sounds: No wheezing, rhonchi or rales.     Comments: Distant breath sounds due to body habitus. Chest:     Chest wall: No tenderness.  Musculoskeletal:        General: No swelling.     Cervical back: Normal range of motion and neck supple.  Skin:    General: Skin is warm and dry.  Neurological:     General: No focal deficit present.     Mental Status: She is alert and oriented to person, place, and time.  Psychiatric:        Mood and Affect: Mood normal.        Behavior: Behavior normal.        Thought Content: Thought content normal.        Judgment: Judgment normal.    Adult ECG Report N/a  Recent Labs: No new labs 06/06/2020: A1c 6.2, TC 148, TG 225, HDL 41, LDL 70.   Na+ 145, K+ 5.2, Cl- 104, HCO3-24, BUN 14, Cr 1.02, Glu 86, Ca2+ 9.5; AST 23, ALT 29, AlkP 133 No results found for: CHOL, HDL, LDLCALC, LDLDIRECT, TRIG, CHOLHDL Lab Results  Component Value Date   CREATININE 0.9 08/02/2015   BUN 16.4 08/02/2015   NA 141 08/02/2015   K 4.7 08/02/2015   CL 103 03/22/2013   CO2 27 08/02/2015   CBC Latest Ref Rng & Units 08/02/2015 08/03/2014 07/07/2013  WBC 3.9 - 10.3 10e3/uL 8.8 7.5 8.7  Hemoglobin 11.6 - 15.9 g/dL 12.5 12.5 12.3  Hematocrit 34.8 - 46.6 % 39.2 39.5 38.5  Platelets 145 - 400 10e3/uL 235 244 234    Lab Results  Component Value Date   TSH 3.12 08/07/2020    ==================================================  COVID-19 Education: The signs and symptoms of COVID-19 were discussed with the patient and how to seek care for testing (follow up with PCP or arrange E-visit).    I spent a total of 14 minutes with the patient spent in direct patient consultation.   Additional time spent with chart review  / charting (studies, outside notes, etc): 16 min Total Time: 30 min  Current medicines are reviewed at length with the patient today.  (+/- concerns) n/a  This visit occurred during the SARS-CoV-2 public health emergency.  Safety protocols were in place, including screening questions prior to the visit, additional usage of staff PPE, and extensive cleaning of exam room while observing appropriate contact time as indicated for disinfecting solutions.  Notice: This dictation was prepared with Dragon dictation along with smaller phrase technology. Any transcriptional errors that result from this process are unintentional and may not be corrected upon review.  Patient Instructions / Medication Changes & Studies & Tests Ordered   Patient Instructions  Medication Instructions:  Stop taking Diltiazem 180 mg  Start Metoprolol succinate 50 mg in the evening  Decrease and take losartan 50 mg ( 1/2 tablet Of 100 mg)  in the morning.  *If you need a refill on your cardiac medications before your next appointment, please call your pharmacy*   Lab Work: Lipid -fasting Cmp - late Aug 2022 If you have labs (blood work) drawn today and your tests are completely normal, you will receive your results only by: [place] (if you have MyChart) OR A paper copy in the mail If you have any lab test that is abnormal or we need to change your treatment, we will call you to review the results.   Testing/Procedures: Not needed   Follow-Up: At [facility], you and your health needs are our priority.  As part of our continuing mission to provide you with exceptional heart care, we have created designated Provider Care Teams.  These Care Teams include your primary Cardiologist (physician) and Advanced Practice Providers (APPs -  Physician Assistants and Nurse Practitioners) who all work together to provide you with the care you need, when you need it.  We  recommend signing up for the patient portal called "MyChart".  Sign up information is provided on this After Visit Summary.  MyChart is used to connect with patients for Virtual Visits (Telemedicine).  Patients are able to view lab/test results, encounter notes, upcoming appointments, etc.  Non-urgent messages can be sent to your provider as well.   To learn more about what you can do with MyChart, go to [email or web address].    Your next appointment:   6 month(s)  The format for your next appointment:   In Person  Provider:   Glenetta Hew, MD    Other instructions  Continue taking blood pressure - but you do not need to it everyday. Please bring it the office when yo come to have labs done for Dr Ellyn Hack to review.   Studies Ordered:   Orders Placed This Encounter  Procedures   Lipid panel   Comprehensive metabolic panel      Glenetta Hew, M.D., M.S. Interventional Cardiologist   Pager # [phone] Phone # [phone] [address] [place], [place] [address]   Thank you for choosing Heartcare at [facility]!!

## 2020-10-30 ENCOUNTER — Other Ambulatory Visit: Payer: Self-pay

## 2020-10-30 ENCOUNTER — Ambulatory Visit: Payer: Medicare HMO | Admitting: Cardiology

## 2020-10-30 ENCOUNTER — Encounter: Payer: Self-pay | Admitting: Cardiology

## 2020-10-30 VITALS — BP 140/80 | HR 103 | Ht 60.0 in | Wt 207.4 lb

## 2020-10-30 DIAGNOSIS — E8881 Metabolic syndrome: Secondary | ICD-10-CM | POA: Diagnosis not present

## 2020-10-30 DIAGNOSIS — R931 Abnormal findings on diagnostic imaging of heart and coronary circulation: Secondary | ICD-10-CM

## 2020-10-30 DIAGNOSIS — I10 Essential (primary) hypertension: Secondary | ICD-10-CM | POA: Diagnosis not present

## 2020-10-30 DIAGNOSIS — Z8249 Family history of ischemic heart disease and other diseases of the circulatory system: Secondary | ICD-10-CM | POA: Diagnosis not present

## 2020-10-30 DIAGNOSIS — R000 Tachycardia, unspecified: Secondary | ICD-10-CM | POA: Diagnosis not present

## 2020-10-30 DIAGNOSIS — E785 Hyperlipidemia, unspecified: Secondary | ICD-10-CM

## 2020-10-30 DIAGNOSIS — E118 Type 2 diabetes mellitus with unspecified complications: Secondary | ICD-10-CM

## 2020-10-30 DIAGNOSIS — E1169 Type 2 diabetes mellitus with other specified complication: Secondary | ICD-10-CM

## 2020-10-30 MED ORDER — LOSARTAN POTASSIUM 50 MG PO TABS: 50.0000 mg | ORAL_TABLET | Freq: Every morning | ORAL | 3 refills | Status: DC

## 2020-10-30 MED ORDER — METOPROLOL SUCCINATE ER 50 MG PO TB24: 50.0000 mg | ORAL_TABLET | Freq: Every evening | ORAL | 3 refills | Status: DC

## 2020-10-30 NOTE — Patient Instructions (Signed)
Medication Instructions:  Stop taking Diltiazem 180 mg  Start Metoprolol succinate 50 mg in the evening  Decrease and take losartan 50 mg ( 1/2 tablet Of 100 mg)  in the morning.  *If you need a refill on your cardiac medications before your next appointment, please call your pharmacy*   Lab Work: Lipid -fasting Cmp - late Aug 2022 If you have labs (blood work) drawn today and your tests are completely normal, you will receive your results only by: [place] (if you have MyChart) OR A paper copy in the mail If you have any lab test that is abnormal or we need to change your treatment, we will call you to review the results.   Testing/Procedures: Not needed   Follow-Up: At [facility], you and your health needs are our priority.  As part of our continuing mission to provide you with exceptional heart care, we have created designated Provider Care Teams.  These Care Teams include your primary Cardiologist (physician) and Advanced Practice Providers (APPs -  Physician Assistants and Nurse Practitioners) who all work together to provide you with the care you need, when you need it.  We recommend signing up for the patient portal called "MyChart".  Sign up information is provided on this After Visit Summary.  MyChart is used to connect with patients for Virtual Visits (Telemedicine).  Patients are able to view lab/test results, encounter notes, upcoming appointments, etc.  Non-urgent messages can be sent to your provider as well.   To learn more about what you can do with MyChart, go to [email or web address].    Your next appointment:   6 month(s)  The format for your next appointment:   In Person  Provider:   Glenetta Hew, MD    Other instructions  Continue taking blood pressure - but you do not need to it everyday. Please bring it the office when yo come to have labs done for Dr Ellyn Hack to review.

## 2020-11-03 ENCOUNTER — Encounter: Payer: Self-pay | Admitting: Cardiology

## 2020-11-03 NOTE — Assessment & Plan Note (Signed)
She clearly has CAD, but with lack of symptoms, likely nonobstructive at this point.  Calcium score of 600 is relatively high risk. ->  As long as she is able to continue doing her level of exercise, no need for ischemic evaluation, just aggressive risk factor modification.Marland Kitchen

## 2020-11-03 NOTE — Assessment & Plan Note (Signed)
She remains tachycardic despite being on diltiazem.  With her having constipation on diltiazem, will switch to beta-blocker, especially in light of her having documented nonobstructive CAD.  I have asked that she monitor her heart rates as well as blood pressure.Marland Kitchen

## 2020-11-03 NOTE — Assessment & Plan Note (Addendum)
Blood pressure log exercising pretty good evening she has had some low blood pressures.  Her pressure today is 140/80 but she is little anxious to hear the results of her test.    Will plan to convert from Diltiazem to Metoprolol -- decrease Losartan while we make the change.  She will continue to keep a blood pressure log so we can determine if we need to titrate up the dose.  We will reduce losartan to 50 mg daily and start Toprol 50 mg for rate control and blood pressure.

## 2020-11-03 NOTE — Assessment & Plan Note (Signed)
Labs as of February had an LDL of 70.  That is on modest dose of lovastatin with-relatively weak statin.  Plan:   Recheck lipids-low threshold to consider titrating to a more potent statin versus increasing the dose.  She is on metformin-with obesity, consider GLP-1 agonist.

## 2020-11-03 NOTE — Assessment & Plan Note (Signed)
Significant elevated coronary artery calcium score.  Recommendation is aggressive risk factor modification.  Target LDL less than 70, strict blood pressure and glycemic control.  Diet/exercise and weight loss.  No need for ischemic evaluation unless her symptoms warrant..  She denies any chest pain or pressure with rest or exertion.  No heart failure symptoms.  No changes to exertional dyspnea related to deconditioning.  She is now walking up to a mile and a half a day and doing well.  We now have baseline level activity.  Plan:   Convert from diltiazem to beta-blocker,  Continue current dose of statin but recheck lipid panel-titrate as necessary.  Continue aspirin 81 mg daily. ->  Okay to hold 5 days preop for surgeries, procedures, colonoscopies etc.

## 2020-11-03 NOTE — Assessment & Plan Note (Signed)
Not unexpectedly with metabolic syndrome, her coronary calcium score is elevated.  She clearly is at high risk for developing obstructive CAD.  Aggressive risk factor modification as noted: Weight loss, glycemic/lipid and blood pressure control.  Consider GLP-1 agonist to help with glycemic control and weight loss.

## 2020-11-03 NOTE — Assessment & Plan Note (Signed)
She is definitely working on her exercise.  We talked about dietary modification.  Goal will be to lose roughly 2 pounds per month over the next year.

## 2020-12-05 DIAGNOSIS — Z6841 Body Mass Index (BMI) 40.0 and over, adult: Secondary | ICD-10-CM | POA: Diagnosis not present

## 2020-12-05 DIAGNOSIS — E785 Hyperlipidemia, unspecified: Secondary | ICD-10-CM | POA: Diagnosis not present

## 2020-12-05 DIAGNOSIS — E1169 Type 2 diabetes mellitus with other specified complication: Secondary | ICD-10-CM | POA: Diagnosis not present

## 2020-12-05 DIAGNOSIS — I10 Essential (primary) hypertension: Secondary | ICD-10-CM | POA: Diagnosis not present

## 2020-12-10 ENCOUNTER — Encounter: Payer: Self-pay | Admitting: Podiatry

## 2020-12-10 ENCOUNTER — Ambulatory Visit: Payer: Medicare HMO | Admitting: Podiatry

## 2020-12-10 ENCOUNTER — Other Ambulatory Visit: Payer: Self-pay

## 2020-12-10 ENCOUNTER — Ambulatory Visit (INDEPENDENT_AMBULATORY_CARE_PROVIDER_SITE_OTHER): Payer: Medicare HMO

## 2020-12-10 DIAGNOSIS — M79672 Pain in left foot: Secondary | ICD-10-CM

## 2020-12-10 DIAGNOSIS — M79671 Pain in right foot: Secondary | ICD-10-CM | POA: Diagnosis not present

## 2020-12-10 DIAGNOSIS — M21962 Unspecified acquired deformity of left lower leg: Secondary | ICD-10-CM

## 2020-12-10 DIAGNOSIS — M21961 Unspecified acquired deformity of right lower leg: Secondary | ICD-10-CM | POA: Diagnosis not present

## 2020-12-10 DIAGNOSIS — L600 Ingrowing nail: Secondary | ICD-10-CM

## 2020-12-10 NOTE — Progress Notes (Signed)
Subjective:   Patient ID: [name], female   DOB: 65 y.o.   MRN: [id number]   HPI Patient presents stating she is getting digital deformities of both feet and she feels like pressure between the big toes and second toes and also is developing ingrown toenails in the second toes of both feet that are very sore.  States she tries to trim and soak them without relief and tries to wear pads for her toes and does not smoke likes to be active   Review of Systems  All other systems reviewed and are negative.      Objective:  Physical Exam Vitals and nursing note reviewed.  Constitutional:      Appearance: She is well-developed.  Pulmonary:     Effort: Pulmonary effort is normal.  Musculoskeletal:        General: Normal range of motion.  Skin:    General: Skin is warm.  Neurological:     Mental Status: She is alert.    Neurovascular status intact muscle strength was found to be adequate range of motion was found to be adequate.  Patient is found to have incurvation of the second nails both feet medial border painful when pressed making shoe gear difficult.  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  Today I went ahead and I recommended correction allowed her to sign consent form and I infiltrated the second digit 60 mg like Marcaine mixture sterile prep done and using sterile instrumentation remove the medial borders and exposed matrix applied phenol 3 applications 30 seconds followed by alcohol  lavage sterile dressing gave instructions for soaks  X-rays indicate that there is deviation of the second digits against the big digits bilateral with mild elevation of the toes noted

## 2020-12-10 NOTE — Patient Instructions (Signed)

## 2020-12-11 ENCOUNTER — Other Ambulatory Visit: Payer: Self-pay | Admitting: Podiatry

## 2020-12-11 DIAGNOSIS — M21961 Unspecified acquired deformity of right lower leg: Secondary | ICD-10-CM

## 2020-12-12 ENCOUNTER — Telehealth: Payer: Self-pay | Admitting: *Deleted

## 2020-12-12 NOTE — Telephone Encounter (Signed)
Patient wanted to know if she needed to fast for  labs  RN informed her,she can take medication  with coffee no sugar ,cream,or water\patient verbalized understanding

## 2020-12-14 ENCOUNTER — Telehealth: Payer: Self-pay | Admitting: *Deleted

## 2020-12-14 ENCOUNTER — Other Ambulatory Visit: Payer: Self-pay | Admitting: Podiatry

## 2020-12-14 MED ORDER — CEPHALEXIN 500 MG PO CAPS: 500.0000 mg | ORAL_CAPSULE | Freq: Three times a day (TID) | ORAL | 0 refills | Status: DC

## 2020-12-14 NOTE — Telephone Encounter (Signed)
Returned call to patient, no answer, left vmessage per Dr Leigh Aurora instructions and that a prescription antibiotic has been called in to pharmacy.

## 2020-12-14 NOTE — Telephone Encounter (Signed)
Patient is calling to request an antibiotic(thinks possible infection),ingrown procedural toe is still red, would prefer something taken by diabetics. Please advise.

## 2020-12-18 ENCOUNTER — Ambulatory Visit (AMBULATORY_SURGERY_CENTER): Payer: Self-pay | Admitting: *Deleted

## 2020-12-18 ENCOUNTER — Other Ambulatory Visit: Payer: Self-pay

## 2020-12-18 VITALS — Ht 60.0 in | Wt 205.0 lb

## 2020-12-18 DIAGNOSIS — E8881 Metabolic syndrome: Secondary | ICD-10-CM | POA: Diagnosis not present

## 2020-12-18 DIAGNOSIS — Z800 Family history of malignant neoplasm of digestive organs: Secondary | ICD-10-CM

## 2020-12-18 DIAGNOSIS — Z8249 Family history of ischemic heart disease and other diseases of the circulatory system: Secondary | ICD-10-CM | POA: Diagnosis not present

## 2020-12-18 DIAGNOSIS — E1169 Type 2 diabetes mellitus with other specified complication: Secondary | ICD-10-CM | POA: Diagnosis not present

## 2020-12-18 DIAGNOSIS — E785 Hyperlipidemia, unspecified: Secondary | ICD-10-CM | POA: Diagnosis not present

## 2020-12-18 DIAGNOSIS — E118 Type 2 diabetes mellitus with unspecified complications: Secondary | ICD-10-CM | POA: Diagnosis not present

## 2020-12-18 DIAGNOSIS — R931 Abnormal findings on diagnostic imaging of heart and coronary circulation: Secondary | ICD-10-CM | POA: Diagnosis not present

## 2020-12-18 LAB — COMPREHENSIVE METABOLIC PANEL
ALT: 23 IU/L (ref 0–32)
AST: 19 IU/L (ref 0–40)
Albumin/Globulin Ratio: 2 (ref 1.2–2.2)
Albumin: 4.5 g/dL (ref 3.8–4.8)
Alkaline Phosphatase: 147 IU/L — ABNORMAL HIGH (ref 44–121)
BUN/Creatinine Ratio: 18 (ref 12–28)
BUN: 19 mg/dL (ref 8–27)
Bilirubin Total: 0.6 mg/dL (ref 0.0–1.2)
CO2: 23 mmol/L (ref 20–29)
Calcium: 9.8 mg/dL (ref 8.7–10.3)
Chloride: 102 mmol/L (ref 96–106)
Creatinine, Ser: 1.05 mg/dL — ABNORMAL HIGH (ref 0.57–1.00)
Globulin, Total: 2.2 g/dL (ref 1.5–4.5)
Glucose: 87 mg/dL (ref 65–99)
Potassium: 5.3 mmol/L — ABNORMAL HIGH (ref 3.5–5.2)
Sodium: 140 mmol/L (ref 134–144)
Total Protein: 6.7 g/dL (ref 6.0–8.5)
eGFR: 58 mL/min/{1.73_m2} — ABNORMAL LOW (ref >59)

## 2020-12-18 LAB — LIPID PANEL
Chol/HDL Ratio: 4.4 ratio (ref 0.0–4.4)
Cholesterol, Total: 161 mg/dL (ref 100–199)
HDL: 37 mg/dL — ABNORMAL LOW (ref >39)
LDL Chol Calc (NIH): 89 mg/dL (ref 0–99)
Triglycerides: 206 mg/dL — ABNORMAL HIGH (ref 0–149)
VLDL Cholesterol Cal: 35 mg/dL (ref 5–40)

## 2020-12-18 MED ORDER — NA SULFATE-K SULFATE-MG SULF 17.5-3.13-1.6 GM/177ML PO SOLN: 1.0000 | ORAL | 0 refills | Status: DC

## 2020-12-18 NOTE — Progress Notes (Signed)
Patient is here in-person for PV. Patient denies any allergies to eggs or soy. Patient denies any problems with anesthesia/sedation. Patient denies any oxygen use at home. Patient denies taking any diet/weight loss medications or blood thinners. Patient is aware of our care-partner policy and 0000000 safety protocol.  Patient is COVID-19 vaccinated.

## 2020-12-27 DIAGNOSIS — Z6841 Body Mass Index (BMI) 40.0 and over, adult: Secondary | ICD-10-CM | POA: Diagnosis not present

## 2020-12-27 DIAGNOSIS — Z853 Personal history of malignant neoplasm of breast: Secondary | ICD-10-CM | POA: Diagnosis not present

## 2020-12-27 DIAGNOSIS — L988 Other specified disorders of the skin and subcutaneous tissue: Secondary | ICD-10-CM | POA: Diagnosis not present

## 2021-01-01 ENCOUNTER — Ambulatory Visit: Payer: Medicare HMO | Admitting: Gastroenterology

## 2021-01-01 ENCOUNTER — Telehealth: Payer: Self-pay | Admitting: Cardiology

## 2021-01-01 ENCOUNTER — Other Ambulatory Visit: Payer: Self-pay

## 2021-01-01 ENCOUNTER — Encounter: Payer: Self-pay | Admitting: Gastroenterology

## 2021-01-01 VITALS — BP 171/91 | HR 87 | Temp 97.2°F | Ht 60.0 in | Wt 205.0 lb

## 2021-01-01 MED ORDER — SODIUM CHLORIDE 0.9 % IV SOLN: 500.0000 mL | Freq: Once | INTRAVENOUS | Status: DC

## 2021-01-01 NOTE — Progress Notes (Signed)
Pt's states no medical or surgical changes since previsit or office visit. VS by CW. 

## 2021-01-01 NOTE — Telephone Encounter (Signed)
Coronary artery calcium score is simply a marker for coronary disease, and not indicative of obstructive disease.    She was seen by me in response to the coronary calcium score, we assessed her symptoms and she is not having any active anginal symptoms.  As such, the recommendation is simply to monitor and treat risk factors.  She does not need a stress test in the absence of symptoms.  We do not routinely check a stress test or coronary CTA in the absence of symptoms.  This should not be a prohibitive risk for her to undergo colonoscopy.  There is no more sedation for colonoscopy than is used for cardiac catheterization.   Glenetta Hew, MD

## 2021-01-01 NOTE — Progress Notes (Deleted)
DR Havery Moros canceled procedure due to patients Calcium being elevated, he would like her to be seen by a cardiologist and then reschedule procedure.  Patient is going to see primary doctor for referral to cardiologist.

## 2021-01-01 NOTE — Progress Notes (Signed)
Dr Havery Moros cancelled procedure for today. Patient had abnormal results on Cardiac CT. He would like patient to be seen by a cardiologist and then reschedule procedure. Patient is going to see primary dr for referral to cardiologist.

## 2021-01-01 NOTE — Telephone Encounter (Signed)
New message:       Patient said she was supposed to have had a Colonoscopy this morning but they had to cancel it. She said they told her she had Calcium in her arteries and could not put her to sleep, because it would be too much stress on her heart. She was told to contact her Cardiologist.

## 2021-01-01 NOTE — Telephone Encounter (Signed)
Spoke with patient of Dr. Ellyn Hack who reports that GI doctor cancelled her colonoscopy d/t her calcium score results - she said Dr. Havery Moros wants her to see cardiology and have a stress test (his note is in epic). Advised will send a message to MD/RN to review and advise

## 2021-01-01 NOTE — Progress Notes (Signed)
Patient arrived for colonoscopy today, she has a history of colon polyps and here for surveillance. As part of pre-procedure evaluation we reviewed her chart. She has a strong FH of CAD in mother and father who both had MIs. Cardiac CT 10/11/20 showed calcium score of 625, high risk for CAD. She does endorse ongoing exertional dyspnea but no chest pains otherwise. She has never had a personal history of MI but at risk with his cardiac CT, she has not seen a cardiologist yet. I reviewed her case with our anesthesia staff. We feel it is best to have her see cardiology and undergo further risk stratification / stress testing in light of her symptoms and these findings, prior to undergoing elective colonoscopy with anesthesia. Patient was in agreement with this plan. I offered to refer her to cardiology but she declined today, she will speak with her primary care Dr. Ellyn Hack about this issue and defer to where he wishes to refer her. All questions answered, she agreed with the plan. She will contact us to reschedule once she has had a cardiology evaluation.  Jolly Mango, MD [facility]

## 2021-01-02 NOTE — Telephone Encounter (Signed)
Hi Dr. Ellyn Hack, Thanks for the follow up. I was actually going to reach out to you about this patient. Yes she presented for her colonoscopy yesterday. I spoke with her in the pre-op area with our anesthesia staff and the patient stated she has been experiencing exertional dyspnea. This, in light of her high coronary artery calcium score and strong family history of heart disease, anesthesia (I spoke with 2 CRNAs about this including the head of the anesthesia group) won't give her anesthesia without further risk stratification / evaluation. Unfortunately she won't be able to proceed with a colonoscopy until she has this evaluation. The patient did want to proceed with seeing a cardiologist when we spoke with her about this yesterday. Would it be okay to have her see a cardiologist in this light? Otherwise we will have a hard time proceeding with an elective colonoscopy. Thanks

## 2021-01-03 NOTE — Telephone Encounter (Signed)
Spoke to patient. Appointment schedule for 01/04/21 with Dr Ellyn Hack. Patient verbalized understanding.

## 2021-01-04 ENCOUNTER — Ambulatory Visit: Payer: Medicare HMO | Admitting: Cardiology

## 2021-01-04 ENCOUNTER — Other Ambulatory Visit: Payer: Self-pay

## 2021-01-04 VITALS — BP 120/70 | HR 90 | Ht 60.0 in | Wt 200.0 lb

## 2021-01-04 DIAGNOSIS — E785 Hyperlipidemia, unspecified: Secondary | ICD-10-CM

## 2021-01-04 DIAGNOSIS — R079 Chest pain, unspecified: Secondary | ICD-10-CM

## 2021-01-04 DIAGNOSIS — R0609 Other forms of dyspnea: Secondary | ICD-10-CM | POA: Insufficient documentation

## 2021-01-04 DIAGNOSIS — E8881 Metabolic syndrome: Secondary | ICD-10-CM

## 2021-01-04 DIAGNOSIS — I10 Essential (primary) hypertension: Secondary | ICD-10-CM | POA: Diagnosis not present

## 2021-01-04 DIAGNOSIS — Z01810 Encounter for preprocedural cardiovascular examination: Secondary | ICD-10-CM

## 2021-01-04 DIAGNOSIS — R0600 Dyspnea, unspecified: Secondary | ICD-10-CM

## 2021-01-04 DIAGNOSIS — E1169 Type 2 diabetes mellitus with other specified complication: Secondary | ICD-10-CM

## 2021-01-04 DIAGNOSIS — R000 Tachycardia, unspecified: Secondary | ICD-10-CM | POA: Diagnosis not present

## 2021-01-04 DIAGNOSIS — R931 Abnormal findings on diagnostic imaging of heart and coronary circulation: Secondary | ICD-10-CM | POA: Diagnosis not present

## 2021-01-04 NOTE — Patient Instructions (Addendum)
Medication Instructions:  See below below- Take  ( regular dose) metoprolol succinate the night before and morning of test two hours prior to test   *If you need a refill on your cardiac medications before your next appointment, please call your pharmacy*   Lab Work: See below  If you have labs (blood work) drawn today and your tests are completely normal, you will receive your results only by: [place] (if you have MyChart) OR A paper copy in the mail If you have any lab test that is abnormal or we need to change your treatment, we will call you to review the results.   Testing/Procedures:  Will be schedule after authorization from your insurance- will be schedule at [facility] radiology  Your physician has requested that you have cardiac CTA. Cardiac computed tomography (CT) is a painless test that uses an x-ray machine to take clear, detailed pictures of your heart.  Please follow instruction sheet as given.    Follow-Up: At [facility], you and your health needs are our priority.  As part of our continuing mission to provide you with exceptional heart care, we have created designated Provider Care Teams.  These Care Teams include your primary Cardiologist (physician) and Advanced Practice Providers (APPs -  Physician Assistants and Nurse Practitioners) who all work together to provide you with the care you need, when you need it.     Your next appointment:   3 month(s)  The format for your next appointment:   In Person  Provider:   Glenetta Hew, MD   Other Instructions     Your cardiac CT will be scheduled at the below location:   [facility] [address] [place], [place] [address] [phone]    Please arrive at the [facility] main entrance (entrance A) of [facility] 30 minutes prior to test start time. Proceed to the [facility] Radiology Department (first floor) to check-in and test  prep.   Please follow these instructions carefully (unless otherwise directed):  Please have lab  BMP at least week prior to test..  On the Night Before the Test: Be sure to Drink plenty of water. Do not consume any caffeinated/decaffeinated beverages or chocolate 12 hours prior to your test. Do not take any antihistamines 12 hours prior to your test.   On the Day of the Test: Drink plenty of water until 1 hour prior to the test. Do not eat any food 4 hours prior to the test. You may take your regular medications prior to the test.  Take  ( regular dose) metoprolol succinate the night before and morning of test two hours prior to test. FEMALES- please wear underwire-free bra if available, avoid dresses & tight clothing            After the Test: Drink plenty of water. After receiving IV contrast, you may experience a mild flushed feeling. This is normal. On occasion, you may experience a mild rash up to 24 hours after the test. This is not dangerous. If this occurs, you can take Benadryl 25 mg and increase your fluid intake. If you experience trouble breathing, this can be serious. If it is severe call 911 IMMEDIATELY. If it is mild, please call our office. If you take any of these medications: Metformin,  please do not take 48 hours after completing test unless otherwise instructed.   Please allow 2-4 weeks for scheduling of routine cardiac CTs. Some  insurance companies require a pre-authorization which may delay scheduling of this test.   For non-scheduling related questions, please contact the cardiac imaging nurse navigator should you have any questions/concerns: Marchia Bond, Cardiac Imaging Nurse Navigator Gordy Clement, Cardiac Imaging Nurse Navigator [facility] Direct Office Dial: [phone]   For scheduling needs, including cancellations and rescheduling, please call [place], [phone].

## 2021-01-04 NOTE — Progress Notes (Signed)
Primary Care Provider: Leonides Sake, MD Cardiologist: Crystal Hew, MD Electrophysiologist: None Gastroenterologist: Dr. Havery [name]  Clinic Note: Chief Complaint  Patient presents with   Follow-up    Colonoscopy canceled because of concerns with shortness of breath and coronary calcium    Shortness of Breath    Walking.   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  We have initiated the aggressive risk factor modification.  With her colonoscopy being delayed until she has full ischemic evaluation, we will proceed with coronary CT angiogram.  Based on these results, we may need to be more aggressive with lipid management.--  Plan:  Continue aspirin and current dose of Toprol for now, but anticipate increasing dose.  (Although I am concerned about some episodes of hypotension on her log) Anticipate increasing statin dose versus changing to rosuvastatin.        Essential hypertension (Chronic)     Other   Sinus tachycardia by electrocardiogram (Chronic)    Heart rate is better with beta-blocker.  I am a little leery of increasing the dose simply because of her low blood pressure readings.  I have asked that she continue to monitor pressures, because I do anticipate that we would probably want to increase her Toprol dose.      Hyperlipidemia associated with type 2 diabetes mellitus (HCC) (Chronic)    Interestingly, her labs in February showed LDL of 78, most recent labs now show LDL of 89.  Hopefully with plans to move toward weight loss, this will improve,    Plan:  I do expect that we would potentially convert her to a more potent statin such as rosuvastatin from the lovastatin based  upon results of her coronary CTA. She is now on metformin, I do think a GLP-1 agonist would be beneficial..      DOE (dyspnea on exertion)    Exertional dyspnea and a near morbidly obese woman with arthritis pains who walks routinely, but notes exertional dyspnea walking up inclines or stairs.  Plan for now with elevated Coronary Calcium Score is to proceed with ischemic evaluation with Coronary CTA and possible CT FFR..      Relevant Orders   CT CORONARY MORPH W/CTA COR W/SCORE W/CA W/CM &/OR WO/CM   Basic metabolic panel   Preop cardiovascular exam    [name] was scheduled to have a colonoscopy performed by Dr. Havery [name].  This was canceled because of concerns with dyspnea and her coronary calcium score.  At this point, we cannot be sure if her dyspnea is simply related to joint pains and obesity versus potentially an ischemic etiology.  Given her elevated coronary calcium score of over 600, with we can use this as needed but is to perform ischemic evaluation.   Options include Myoview stress test (will have to be Lexiscan because of her inability to walk on treadmill) versus coronary CTA with possible CT FFR.  Given her body habitus, coronary CTA is a better option.  Also the full anatomic evaluation along with potential ischemic evaluation makes coronary CTA better option.  The only issue is try to get her heart rate down to an appropriate level.  She will likely require additional beta-blocker.  (  She also has some of her diltiazem left which could be used at the time of her CT)  Plan: Coronary CTA with possible CT FFR  My main concern with ischemic evaluation this situation however is that it would simply delay her colonoscopy..      Relevant Orders   CT CORONARY MORPH W/CTA COR W/SCORE W/CA W/CM &/OR WO/CM   Chest pain    Relatively infrequent episodes, not even mentioned unless I asked for details.  More described as a tightness when try to catch her breath.  Cannot exclude  ischemic etiology since it is exertional.  Plan: Ischemic evaluation with coronary CTA.      Relevant Orders   CT CORONARY MORPH W/CTA COR W/SCORE W/CA W/CM &/OR WO/CM   Metabolic syndrome (Chronic)    Definitely portends increased cardiovascular risk.  See individual components.      Relevant Orders   CT CORONARY MORPH W/CTA COR W/SCORE W/CA W/CM &/OR WO/CM   Basic metabolic panel   Morbid obesity ([place]) (Chronic)    She is exercising, and I think this may be actually improving her exertional dyspnea with some weight loss.  We talked about dietary modification, however we could also consider the possibility of GLP-1 agonist for cardiovascular benefit and weight loss.       ===================================  HPI:    [name] is a 65 y.o. female with a PMH notable for Metabolic Syndrome (DM-2, HTN, HLD), and family history of CAD with Elevated Coronary Calcium Score (625) who presents today for reevaluation for preprocedural evaluation prior to colonoscopy.. She is been seen today at the request of Dr. Garey [name] was last seen on 10/30/2020 in follow-up from a Coronary Calcium Score.  At this time she was not noticing any symptoms whatsoever of chest tightness pressure or dyspnea with rest or exertion.  She had some mild exertional dyspnea related to deconditioning but was walking a mile and a half a day.  Recommended risk factor modification: Changed from Diltiazem to Toprol (noted to have Sinus Tachycardia) Closely follow lipids pending recheck Started on aspirin 81 mg  Recent Hospitalizations:  Presented for colonoscopy that was canceled by CRNA due to dyspnea and coronary artery calcium.  Insisted that she be seen by cardiology - despite the fact that she had just been seen.  Reviewed  CV studies:    The following studies were reviewed today: (if available, images/films reviewed: From Epic Chart or Care Everywhere) No new study:   Interval History:    [name] returns today for reevaluation by cardiology because of "dyspnea ".  When I talked her the last time, she did not really mention concerns about exertional dyspnea, noting that maybe she gets short of breath if she walks longer distances or uphill quickly.  She says that when she was at the hospital, they were walking fast and she had had to walk uphill to the entrance.  She was a little bit short of breath, but did not think twice about it.  Apparently this was a concerning symptom which caused her colonoscopy to be canceled/postponed.  She has not had any chest pain or pressure, however on further questioning, she says that she may feel some tightness in her chest when she is finding it hard to breathe.  CV Review of Symptoms (Summary) Cardiovascular ROS: positive for - dyspnea on exertion and no real notable change although potentially more prominent. negative for - chest pain, edema, irregular heartbeat, loss  of consciousness, orthopnea, palpitations, paroxysmal nocturnal dyspnea, rapid heart rate, shortness of breath, or syncope/near syncope or TIA/amaurosis fugax, claudication   REVIEWED OF SYSTEMS   Pertinent symptoms: Otherwise negative. Constipation actually seems better since stopping diltiazem, Has baseline back and joint pain which makes it somewhat more laborious to walk up steps.  This may be why she was dyspneic. Occasional dizziness.  Mild bruising.  I have reviewed and (if needed) personally updated the patient's problem list, medications, allergies, past medical and surgical history, social and family history.   PAST MEDICAL HISTORY   Past Medical History:  Diagnosis Date   Agatston coronary artery calcium score greater than 400 07/11/2020   CAC Score 625.  LAD 204, LCx 227, RCA 195 (96th %-ile)   Anesthesia complication    Hard to wake up past knee surgery in ? 2004!   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  A1c 6.2 (February 2022)   GERD (gastroesophageal reflux disease)    History of breast cancer 2011   s/p L lobectomy in 2012 as well as radiation with letrozole x5 years.  (Dr. Jana Hakim)   Hyperlipidemia    Hypertension    under control with med., has been on med. x 6-7 yr.   Lesion of right ulnar nerve 08/2012   S/P radiation therapy /last radiation treatment 07/2010 07/04/10 - 08/11/10   5040 cGy/28 Treatments to Left Breast   Wears dentures    upper   Wears partial dentures    lower    PAST SURGICAL HISTORY   Past Surgical History:  Procedure Laterality Date   CARPAL TUNNEL RELEASE Right 09/21/2012   Procedure: CARPAL TUNNEL RELEASE;  Surgeon: Cammie Sickle., MD;  Location: [place];  Service: Orthopedics;  Laterality: Right;   CARPAL TUNNEL RELEASE Left 11/30/2012   Procedure: CARPAL TUNNEL RELEASE;  Surgeon: Cammie Sickle., MD;  Location: [place];  Service: Orthopedics;  Laterality: Left;   CATARACT EXTRACTION Bilateral 2017   Bil/ right eye on 08/27/2015 and left eye on 09/17/2015 Dr Tommy Rainwater   CHOLECYSTECTOMY  09/16/2005   COLONOSCOPY  2017   KNEE ARTHROSCOPY Left 08/11/2002   PARTIAL MASTECTOMY WITH NEEDLE LOCALIZATION AND AXILLARY SENTINEL LYMPH NODE BX Left 05/21/2010   with re-exc. medial margin lumpectomy site   ROBOTIC ASSISTED LAPAROSCOPIC LYSIS OF ADHESION  09/17/2010   ROBOTIC ASSISTED TOTAL HYSTERECTOMY WITH BILATERAL SALPINGO OOPHERECTOMY  09/17/2010   SHOULDER OPEN ROTATOR CUFF REPAIR Right 03/29/2013   Procedure: ROTATOR CUFF REPAIR SHOULDER OPEN WITH SUBACROMIAL DECOMPRESSION AND DISTAL CLAVICLE RESECTION;  Surgeon: Cammie Sickle., MD;  Location: [facility];  Service: Orthopedics;  Laterality: Right;   ULNAR NERVE TRANSPOSITION Right 09/21/2012   Procedure: RIGHT IN SITU DECOMPRESSION  ULNAR NERVE;  Surgeon: Cammie Sickle., MD;  Location: [place];  Service: Orthopedics;  Laterality: Right;    Immunization History  Administered Date(s) Administered   Influenza-Unspecified 02/09/2020   PFIZER Comirnaty(Gray Top)Covid-19 Tri-Sucrose Vaccine 02/09/2020   Pneumococcal Conjugate-13 06/06/2020   Zoster Recombinat (Shingrix) 05/09/2020    MEDICATIONS/ALLERGIES   Current Meds  Medication Sig   amitriptyline (ELAVIL) 100 MG tablet Take 300 mg by mouth at bedtime.    aspirin EC 81 MG tablet Take  81 mg by mouth daily. Swallow whole.   famotidine (PEPCID) 40 MG tablet    losartan (COZAAR) 50 MG tablet Take 1 tablet (50 mg total) by mouth every morning.   lovastatin (MEVACOR) 20 MG tablet Take 1 tablet (20 mg total) by mouth at bedtime.   metFORMIN (GLUCOPHAGE) 1000 MG tablet Take 0.5 tablets (500 mg total) by mouth daily with breakfast. (Patient taking differently: Take 500 mg by mouth 2 (two) times daily with a meal.)   metoprolol succinate (TOPROL-XL) 50 MG 24 hr tablet Take 1 tablet (50 mg total) by mouth every evening. Take with or immediately following a meal.   ONE TOUCH ULTRA TEST test strip Daily.   Current Facility-Administered Medications for the 01/04/21 encounter (Office Visit) with Leonie Man, MD  Medication   0.9 %  sodium chloride infusion    Allergies  Allergen Reactions   Adhesive [Tape] Other (See Comments)    BLISTERS   Hydrocodone Nausea And Vomiting   Diltiazem Other (See Comments)    constipation    SOCIAL HISTORY/FAMILY HISTORY   Reviewed in Epic:  Pertinent findings:  Social History   Tobacco Use   Smoking status: Former    Types: Cigarettes    Quit date: 05/07/2010    Years since quitting: 10.6   Smokeless tobacco: Never  Vaping Use   Vaping Use: Never used  Substance Use Topics   Alcohol use: No   Drug use: No   Social History   Social History Narrative   Married   Stay at home Husband   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   Exercises 3 to 4 days a week    OBJCTIVE -PE, EKG, labs   Wt Readings from Last 3 Encounters:  01/04/21 200 lb (90.7 kg)  01/01/21 205 lb (93 kg)  12/18/20 205 lb (93 kg)    Physical Exam: BP 120/70 (BP Location: Left Arm, Patient Position: Sitting, Cuff Size: Large)   Pulse 90   Ht 5' (1.524 m)   Wt 200 lb (90.7 kg)   BMI 39.06 kg/m  She brought a blood pressure log showing some blood pressures as low as 96/67, and as high as 157/94.  Heart rates have been in mostly in the 80s to low 90s with 1 reading of 102. Physical Exam Vitals reviewed.  Constitutional:      General: She is not in acute distress.    Appearance: She is well-developed. She is obese. She is not toxic-appearing or diaphoretic.     Comments: Borderline morbidly obese, but otherwise healthy-appearing.  She has a walking stick to help with balance.  HENT:     Head: Normocephalic and atraumatic.  Neck:     Vascular: No carotid bruit or JVD.  Cardiovascular:     Rate and Rhythm: Normal rate and regular rhythm. No extrasystoles are present.    Pulses: Normal pulses.     Heart sounds: S1 normal and S2 normal. Heart sounds are distant. No murmur heard.   No friction rub. No gallop.  Pulmonary:     Effort: Pulmonary effort is normal. No respiratory distress.     Breath sounds: Normal breath sounds. No stridor. No wheezing or rales.  Musculoskeletal:        General: No swelling. Normal range of motion.     Cervical back: Normal range of motion and neck supple.  Neurological:     General: No focal deficit present.  Mental Status: She is alert and oriented to person, place, and time. Mental status is at baseline.     Gait: Gait normal.  Psychiatric:        Mood and Affect: Mood normal.        Behavior: Behavior normal.        Thought Content: Thought content normal.        Judgment: Judgment normal.    Adult ECG Report N/a  Recent Labs:  Reviewed Lab Results  Component Value Date   CHOL 161 12/18/2020   HDL 37 (L) 12/18/2020   LDLCALC 89 12/18/2020   TRIG 206 (H) 12/18/2020   CHOLHDL 4.4 12/18/2020   Lab Results  Component Value Date   CREATININE 1.05 (H) 12/18/2020   BUN 19 12/18/2020   NA 140 12/18/2020   K 5.3 (H) 12/18/2020   CL 102 12/18/2020   CO2 23 12/18/2020   CBC Latest Ref Rng & Units 08/02/2015 08/03/2014 07/07/2013  WBC 3.9 - 10.3 10e3/uL 8.8 7.5 8.7  Hemoglobin 11.6 - 15.9 g/dL 12.5 12.5 12.3  Hematocrit 34.8 - 46.6 % 39.2 39.5 38.5  Platelets 145 - 400 10e3/uL 235 244 234    Lab Results  Component Value Date   HGBA1C 6.2 (A) 08/07/2020   Lab Results  Component Value Date   TSH 3.12 08/07/2020    ==================================================  COVID-19 Education: The signs and symptoms of COVID-19 were discussed with the patient and how to seek care for testing (follow up with PCP or arrange E-visit).    I spent a total of 17mnutes with the patient spent in direct patient consultation.  We went through the details of why she had to be rescheduled.  We discussed the level of dyspnea, however activity etc.  We discussed whether coronary CTA is. Additional time spent with chart review  / charting (studies, outside notes, etc): 16 min Total Time: 41 min  Current medicines are reviewed at length with the patient today.  (+/- concerns) N/A  This visit occurred during the SARS-CoV-2 public health emergency.  Safety protocols were in place, including screening questions prior to the visit, additional usage of staff PPE, and extensive cleaning of exam room while observing appropriate contact time as indicated for disinfecting solutions.  Notice: This dictation was prepared with Dragon dictation along with smaller phrase technology. Any transcriptional errors that result from this process are unintentional and may not be corrected upon review.  Orders Placed This Encounter  Procedures   CT  CORONARY MORPH W/CTA COR W/SCORE W/CA W/CM &/OR WO/CM   Basic metabolic panel    Patient Instructions / Medication Changes & Studies & Tests Ordered   Patient Instructions  Medication Instructions:  See below below- Take  ( regular dose) metoprolol succinate the night before and morning of test two hours prior to test   *If you need a refill on your cardiac medications before your next appointment, please call your pharmacy*   Lab Work: See below  If you have labs (blood work) drawn today and your tests are completely normal, you will receive your results only by: MyChart Message (if you have MyChart) OR A paper copy in the mail If you have any lab test that is abnormal or we need to change your treatment, we will call you to review the results.   Testing/Procedures:  Will be schedule after authorization from your insurance- [facility]  Your physician has requested that  you have cardiac CTA. Cardiac computed tomography (CT) is a painless test that uses an x-ray machine to take clear, detailed pictures of your heart.  Please follow instruction sheet as given.    Follow-Up: At [facility], you and your health needs are our priority.  As part of our continuing mission to provide you with exceptional heart care, we have created designated Provider Care Teams.  These Care Teams include your primary Cardiologist (physician) and Advanced Practice Providers (APPs -  Physician Assistants and Nurse Practitioners) who all work together to provide you with the care you need, when you need it.   Your next appointment:   3 month(s)-unless The CT angiogram shows concerning findings.  The format for your next appointment:   In Person  Provider:   Glenetta Hew, MD   Other Instructions -> About coronary CTA      [name], M.D., M.S. Interventional Cardiologist   Pager # [phone] Phone # [phone] [address] [place], [place] [address]   Thank you for choosing Heartcare at [facility]!!

## 2021-01-06 ENCOUNTER — Encounter: Payer: Self-pay | Admitting: Cardiology

## 2021-01-06 DIAGNOSIS — R079 Chest pain, unspecified: Secondary | ICD-10-CM | POA: Insufficient documentation

## 2021-01-06 NOTE — Assessment & Plan Note (Signed)
Relatively infrequent episodes, not even mentioned unless I asked for details.  More described as a tightness when try to catch her breath.  Cannot exclude ischemic etiology since it is exertional.  Plan: Ischemic evaluation with coronary CTA.

## 2021-01-06 NOTE — Assessment & Plan Note (Signed)
Definitely portends increased cardiovascular risk.  See individual components.

## 2021-01-06 NOTE — Assessment & Plan Note (Addendum)
She does have a relatively elevated coronary calcium score level.  We have initiated the aggressive risk factor modification.  With her colonoscopy being delayed until she has full ischemic evaluation, we will proceed with coronary CT angiogram.  Based on these results, we may need to be more aggressive with lipid management.--  Plan:   Continue aspirin and current dose of Toprol for now, but anticipate increasing dose.  (Although I am concerned about some episodes of hypotension on her log)  Anticipate increasing statin dose versus changing to rosuvastatin.

## 2021-01-06 NOTE — Assessment & Plan Note (Signed)
She is exercising, and I think this may be actually improving her exertional dyspnea with some weight loss.  We talked about dietary modification, however we could also consider the possibility of GLP-1 agonist for cardiovascular benefit and weight loss.

## 2021-01-06 NOTE — Assessment & Plan Note (Signed)
[name] was scheduled to have a colonoscopy performed by Dr. Havery Moros.  This was canceled because of concerns with dyspnea and her coronary calcium score.  At this point, we cannot be sure if her dyspnea is simply related to joint pains and obesity versus potentially an ischemic etiology.  Given her elevated coronary calcium score of over 600, with we can use this as needed but is to perform ischemic evaluation.   Options include Myoview stress test (will have to be Lexiscan because of her inability to walk on treadmill) versus coronary CTA with possible CT FFR.  Given her body habitus, coronary CTA is a better option.  Also the full anatomic evaluation along with potential ischemic evaluation makes coronary CTA better option.  The only issue is try to get her heart rate down to an appropriate level.  She will likely require additional beta-blocker.  (She also has some of her diltiazem left which could be used at the time of her CT)  Plan: Coronary CTA with possible CT FFR  My main concern with ischemic evaluation this situation however is that it would simply delay her colonoscopy.Marland Kitchen

## 2021-01-06 NOTE — Assessment & Plan Note (Signed)
Interestingly, her labs in February showed LDL of 78, most recent labs now show LDL of 89.  Hopefully with plans to move toward weight loss, this will improve,    Plan:   I do expect that we would potentially convert her to a more potent statin such as rosuvastatin from the lovastatin based upon results of her coronary CTA.  She is now on metformin, I do think a GLP-1 agonist would be beneficial..

## 2021-01-06 NOTE — Assessment & Plan Note (Signed)
Heart rate is better with beta-blocker.  I am a little leery of increasing the dose simply because of her low blood pressure readings.  I have asked that she continue to monitor pressures, because I do anticipate that we would probably want to increase her Toprol dose.

## 2021-01-06 NOTE — Assessment & Plan Note (Signed)
Exertional dyspnea and a near morbidly obese woman with arthritis pains who walks routinely, but notes exertional dyspnea walking up inclines or stairs.  Plan for now with elevated Coronary Calcium Score is to proceed with ischemic evaluation with Coronary CTA and possible CT FFR.Marland Kitchen

## 2021-01-11 ENCOUNTER — Telehealth (HOSPITAL_COMMUNITY): Payer: Self-pay | Admitting: Emergency Medicine

## 2021-01-11 DIAGNOSIS — R079 Chest pain, unspecified: Secondary | ICD-10-CM

## 2021-01-11 MED ORDER — IVABRADINE HCL 5 MG PO TABS: 10.0000 mg | ORAL_TABLET | Freq: Once | ORAL | 0 refills | Status: AC

## 2021-01-11 NOTE — Telephone Encounter (Signed)
Attempted to call patient regarding upcoming cardiac CT appointment. Left message on voicemail with name and callback number Marchia Bond RN Navigator Cardiac [facility] [phone] Office [phone] Cell   After reviewing chart and talking with Dr. Ellyn Hack it was decided that we prescribe a one time dose '10mg'$  ivabradine to the patient for CCTA HR control. This was sent to her pharm on file. Left message for call back

## 2021-01-11 NOTE — Telephone Encounter (Signed)
Attempted to call patient regarding upcoming cardiac CT appointment. °Left message on voicemail with name and callback number °Zetha Kuhar RN Navigator Cardiac Imaging °[facility] °[phone] Office °[phone] Cell ° °

## 2021-01-13 HISTORY — PX: OTHER SURGICAL HISTORY: SHX169

## 2021-01-14 ENCOUNTER — Ambulatory Visit (HOSPITAL_COMMUNITY)
Admission: RE | Admit: 2021-01-14 | Discharge: 2021-01-14 | Disposition: A | Discharge status: Home / Self Care | Payer: Medicare HMO | Source: Ambulatory Visit | Attending: Cardiology | Admitting: Cardiology

## 2021-01-14 ENCOUNTER — Other Ambulatory Visit: Payer: Self-pay | Admitting: Cardiovascular Disease

## 2021-01-14 ENCOUNTER — Other Ambulatory Visit: Payer: Self-pay

## 2021-01-14 ENCOUNTER — Ambulatory Visit (HOSPITAL_COMMUNITY)
Admission: RE | Admit: 2021-01-14 | Discharge: 2021-01-14 | Disposition: A | Discharge status: Home / Self Care | Payer: Medicare HMO | Source: Ambulatory Visit | Attending: Cardiovascular Disease | Admitting: Cardiovascular Disease

## 2021-01-14 ENCOUNTER — Other Ambulatory Visit (HOSPITAL_COMMUNITY): Payer: Self-pay | Admitting: Emergency Medicine

## 2021-01-14 DIAGNOSIS — R933 Abnormal findings on diagnostic imaging of other parts of digestive tract: Secondary | ICD-10-CM

## 2021-01-14 DIAGNOSIS — I700 Atherosclerosis of aorta: Secondary | ICD-10-CM | POA: Insufficient documentation

## 2021-01-14 DIAGNOSIS — I2510 Atherosclerotic heart disease of native coronary artery without angina pectoris: Secondary | ICD-10-CM | POA: Insufficient documentation

## 2021-01-14 DIAGNOSIS — R0600 Dyspnea, unspecified: Secondary | ICD-10-CM | POA: Insufficient documentation

## 2021-01-14 DIAGNOSIS — R079 Chest pain, unspecified: Secondary | ICD-10-CM | POA: Diagnosis not present

## 2021-01-14 DIAGNOSIS — E8881 Metabolic syndrome: Secondary | ICD-10-CM | POA: Insufficient documentation

## 2021-01-14 DIAGNOSIS — R931 Abnormal findings on diagnostic imaging of heart and coronary circulation: Secondary | ICD-10-CM | POA: Insufficient documentation

## 2021-01-14 DIAGNOSIS — K760 Fatty (change of) liver, not elsewhere classified: Secondary | ICD-10-CM | POA: Diagnosis not present

## 2021-01-14 DIAGNOSIS — R0789 Other chest pain: Secondary | ICD-10-CM

## 2021-01-14 DIAGNOSIS — Z01810 Encounter for preprocedural cardiovascular examination: Secondary | ICD-10-CM | POA: Insufficient documentation

## 2021-01-14 DIAGNOSIS — R0609 Other forms of dyspnea: Secondary | ICD-10-CM

## 2021-01-14 MED ORDER — IOHEXOL 350 MG/ML SOLN
95.0000 mL | Freq: Once | INTRAVENOUS | Status: AC | PRN
  Administered 2021-01-14: 95 mL via INTRAVENOUS

## 2021-01-14 MED ORDER — NITROGLYCERIN 0.4 MG SL SUBL
0.8000 mg | SUBLINGUAL_TABLET | Freq: Once | SUBLINGUAL | Status: AC
  Administered 2021-01-14: 0.8 mg via SUBLINGUAL

## 2021-01-14 MED ORDER — METOPROLOL TARTRATE 5 MG/5ML IV SOLN
INTRAVENOUS | Status: AC
  Administered 2021-01-14: 10 mg
  Filled 2021-01-14: qty 10

## 2021-01-14 MED ORDER — METOPROLOL TARTRATE 5 MG/5ML IV SOLN: 5.0000 mg | INTRAVENOUS | Status: DC | PRN

## 2021-01-14 MED ORDER — NITROGLYCERIN 0.4 MG SL SUBL
SUBLINGUAL_TABLET | SUBLINGUAL | Status: AC
  Filled 2021-01-14: qty 2

## 2021-01-15 ENCOUNTER — Telehealth: Payer: Self-pay | Admitting: Cardiology

## 2021-01-15 NOTE — Telephone Encounter (Signed)
Patient returned call for CT results. 

## 2021-01-15 NOTE — Telephone Encounter (Signed)
Pt already made aware of results. See result encounter.

## 2021-01-17 ENCOUNTER — Ambulatory Visit: Payer: Medicare HMO | Admitting: Obstetrics & Gynecology

## 2021-01-17 DIAGNOSIS — Z0289 Encounter for other administrative examinations: Secondary | ICD-10-CM

## 2021-01-18 ENCOUNTER — Telehealth: Payer: Self-pay

## 2021-01-18 NOTE — Telephone Encounter (Signed)
error 

## 2021-01-29 ENCOUNTER — Encounter: Payer: Medicare HMO | Admitting: Gastroenterology

## 2021-02-06 ENCOUNTER — Ambulatory Visit: Payer: Medicare HMO | Admitting: Endocrinology

## 2021-02-12 DIAGNOSIS — H26493 Other secondary cataract, bilateral: Secondary | ICD-10-CM | POA: Diagnosis not present

## 2021-02-12 DIAGNOSIS — E119 Type 2 diabetes mellitus without complications: Secondary | ICD-10-CM | POA: Diagnosis not present

## 2021-02-12 DIAGNOSIS — Z7984 Long term (current) use of oral hypoglycemic drugs: Secondary | ICD-10-CM | POA: Diagnosis not present

## 2021-02-18 ENCOUNTER — Telehealth: Payer: Self-pay | Admitting: Gastroenterology

## 2021-02-18 DIAGNOSIS — Z800 Family history of malignant neoplasm of digestive organs: Secondary | ICD-10-CM

## 2021-02-18 MED ORDER — NA SULFATE-K SULFATE-MG SULF 17.5-3.13-1.6 GM/177ML PO SOLN: 1.0000 | Freq: Once | ORAL | 0 refills | Status: AC

## 2021-02-18 NOTE — Telephone Encounter (Signed)
Inbound call from patient stating she needs a script for the prep medication to be sent to [facility] in [place] please.

## 2021-02-18 NOTE — Telephone Encounter (Signed)
Suprep sent to [facility] per pt request

## 2021-02-26 ENCOUNTER — Other Ambulatory Visit: Payer: Self-pay

## 2021-02-26 ENCOUNTER — Encounter: Payer: Self-pay | Admitting: Gastroenterology

## 2021-02-26 ENCOUNTER — Ambulatory Visit (AMBULATORY_SURGERY_CENTER): Payer: Medicare HMO | Admitting: Gastroenterology

## 2021-02-26 VITALS — BP 122/61 | HR 77 | Temp 96.9°F | Resp 16 | Ht 60.0 in | Wt 205.0 lb

## 2021-02-26 DIAGNOSIS — Z800 Family history of malignant neoplasm of digestive organs: Secondary | ICD-10-CM | POA: Diagnosis not present

## 2021-02-26 DIAGNOSIS — Z1211 Encounter for screening for malignant neoplasm of colon: Secondary | ICD-10-CM

## 2021-02-26 MED ORDER — SODIUM CHLORIDE 0.9 % IV SOLN: 500.0000 mL | Freq: Once | INTRAVENOUS | Status: DC

## 2021-02-26 NOTE — Patient Instructions (Signed)
Please read handouts provided. Continue present medications. Repeat colonoscopy in 5 years for screening.  YOU HAD AN ENDOSCOPIC PROCEDURE TODAY AT [facility]:   Refer to the procedure report that was given to you for any specific questions about what was found during the examination.  If the procedure report does not answer your questions, please call your gastroenterologist to clarify.  If you requested that your care partner not be given the details of your procedure findings, then the procedure report has been included in a sealed envelope for you to review at your convenience later.  YOU SHOULD EXPECT: Some feelings of bloating in the abdomen. Passage of more gas than usual.  Walking can help get rid of the air that was put into your GI tract during the procedure and reduce the bloating. If you had a lower endoscopy (such as a colonoscopy or flexible sigmoidoscopy) you may notice spotting of blood in your stool or on the toilet paper. If you underwent a bowel prep for your procedure, you may not have a normal bowel movement for a few days.  Please Note:  You might notice some irritation and congestion in your nose or some drainage.  This is from the oxygen used during your procedure.  There is no need for concern and it should clear up in a day or so.  SYMPTOMS TO REPORT IMMEDIATELY:  Following lower endoscopy (colonoscopy or flexible sigmoidoscopy):  Excessive amounts of blood in the stool  Significant tenderness or worsening of abdominal pains  Swelling of the abdomen that is new, acute  Fever of 100F or higher   For urgent or emergent issues, a gastroenterologist can be reached at any hour by calling [phone]. Do not use MyChart messaging for urgent concerns.    DIET:  We do recommend a small meal at first, but then you may proceed to your regular diet.  Drink plenty of fluids but you should avoid alcoholic beverages for 24 hours.  ACTIVITY:  You should plan  to take it easy for the rest of today and you should NOT DRIVE or use heavy machinery until tomorrow (because of the sedation medicines used during the test).    FOLLOW UP: Our staff will call the number listed on your records 48-72 hours following your procedure to check on you and address any questions or concerns that you may have regarding the information given to you following your procedure. If we do not reach you, we will leave a message.  We will attempt to reach you two times.  During this call, we will ask if you have developed any symptoms of COVID 19. If you develop any symptoms (ie: fever, flu-like symptoms, shortness of breath, cough etc.) before then, please call [phone].  If you test positive for Covid 19 in the 2 weeks post procedure, please call and report this information to [place].    If any biopsies were taken you will be contacted by phone or by letter within the next 1-3 weeks.  Please call us at [phone] if you have not heard about the biopsies in 3 weeks.    SIGNATURES/CONFIDENTIALITY: You and/or your care partner have signed paperwork which will be entered into your electronic medical record.  These signatures attest to the fact that that the information above on your After Visit Summary has been reviewed and is understood.  Full responsibility of the confidentiality of this discharge information lies with you and/or your care-partner.

## 2021-02-26 NOTE — Progress Notes (Signed)
VS taken by DT 

## 2021-02-26 NOTE — Op Note (Signed)
[facility] Patient Name: [name] Procedure Date: 02/26/2021 2:28 PM MRN: [id number] Endoscopist: Remo Lipps P. Havery Moros , MD Age: 65 Referring MD:  Date of Birth: [birth year] Gender: Female Account #: [id number] Procedure:                Colonoscopy Indications:              Screening in patient at increased risk: Family                            history of 1st-degree relative with colorectal                            cancer (sister dx age 33s) Medicines:                Monitored Anesthesia Care Procedure:                Pre-Anesthesia Assessment:                           - Prior to the procedure, a History and Physical                            was performed, and patient medications and                            allergies were reviewed. The patient's tolerance of                            previous anesthesia was also reviewed. The risks                            and benefits of the procedure and the sedation                            options and risks were discussed with the patient.                            All questions were answered, and informed consent                            was obtained. Prior Anticoagulants: The patient has                            taken no previous anticoagulant or antiplatelet                            agents. ASA Grade Assessment: III - A patient with                            severe systemic disease. After reviewing the risks                            and benefits, the patient was deemed in  satisfactory condition to undergo the procedure.                           After obtaining informed consent, the colonoscope                            was passed under direct vision. Throughout the                            procedure, the patient's blood pressure, pulse, and                            oxygen saturations were monitored continuously. The                            Olympus PCF-H190DL ([id number])  Colonoscope was                            introduced through the anus and advanced to the the                            cecum, identified by appendiceal orifice and                            ileocecal valve. The colonoscopy was performed                            without difficulty. The patient tolerated the                            procedure well. The quality of the bowel                            preparation was good. The ileocecal valve,                            appendiceal orifice, and rectum were photographed. Scope In: 2:44:28 PM Scope Out: 2:59:31 PM Scope Withdrawal Time: 0 hours 13 minutes 16 seconds  Total Procedure Duration: 0 hours 15 minutes 3 seconds  Findings:                 The perianal and digital rectal examinations were                            normal.                           A few small-mouthed diverticula were found in the                            sigmoid colon and ascending colon.                           Internal hemorrhoids were found during  retroflexion. The hemorrhoids were small.                           The exam was otherwise without abnormality. No                            polyps. Complications:            No immediate complications. Estimated blood loss:                            None. Estimated Blood Loss:     Estimated blood loss: none. Impression:               - Diverticulosis in the sigmoid colon and in the                            ascending colon.                           - Internal hemorrhoids.                           - The examination was otherwise normal.                           - No polyps. Recommendation:           - Patient has a contact number available for                            emergencies. The signs and symptoms of potential                            delayed complications were discussed with the                            patient. Return to normal activities tomorrow.                             Written discharge instructions were provided to the                            patient.                           - Resume previous diet.                           - Continue present medications.                           - Repeat colonoscopy in 5 years for screening                            purposes given strong family history of colon cancer Quantay Zaremba P. Darianna Amy, MD 02/26/2021 3:04:23 PM This report has been signed electronically.

## 2021-02-26 NOTE — Progress Notes (Signed)
[facility] History and Physical   Primary Care Physician:  Hamrick, Lorin Mercy, MD   Reason for Procedure:   Family history of colon cancer  Plan:    colonoscopy     HPI: [name] is a 65 y.o. female  here for colonoscopy screening. Last exam > 5 years ago. Patient denies any bowel symptoms at this time. Sister had colon cancer diagnosed age 78s. Otherwise feels well without any cardiopulmonary symptoms.    Past Medical History:  Diagnosis Date   Agatston coronary artery calcium score greater than 400 07/11/2020   CAC Score 625.  LAD 204, LCx 227, RCA 195 (96th %-ile)   Anesthesia complication    Hard to wake up past knee surgery in ? 2004!   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  A1c 6.2 (February 2022)   GERD (gastroesophageal reflux disease)    History of breast cancer 2011   s/p L lobectomy in 2012 as well as radiation with letrozole x5 years.  (Dr. Jana Hakim)   Hyperlipidemia    Hypertension    under control with med., has been on med. x 6-7 yr.   Lesion of right ulnar nerve 08/2012   S/P radiation therapy /last radiation treatment 07/2010 07/04/10 - 08/11/10   5040 cGy/28 Treatments to Left Breast   Wears dentures    upper   Wears partial dentures    lower    Past Surgical History:  Procedure Laterality Date   CARPAL TUNNEL RELEASE Right 09/21/2012   Procedure: CARPAL TUNNEL RELEASE;  Surgeon: Cammie Sickle., MD;  Location: [facility];  Service: Orthopedics;  Laterality: Right;   CARPAL TUNNEL RELEASE Left 11/30/2012   Procedure: CARPAL TUNNEL RELEASE;  Surgeon: Cammie Sickle., MD;  Location: [facility];  Service: Orthopedics;  Laterality: Left;   CATARACT EXTRACTION Bilateral 2017   Bil/ right eye on 08/27/2015 and left eye on 09/17/2015 Dr Tommy Rainwater    CHOLECYSTECTOMY  09/16/2005   COLONOSCOPY  2017   KNEE ARTHROSCOPY Left 08/11/2002   PARTIAL MASTECTOMY WITH NEEDLE LOCALIZATION AND AXILLARY SENTINEL LYMPH NODE BX Left 05/21/2010   with re-exc. medial margin lumpectomy site   ROBOTIC ASSISTED LAPAROSCOPIC LYSIS OF ADHESION  09/17/2010   ROBOTIC ASSISTED TOTAL HYSTERECTOMY WITH BILATERAL SALPINGO OOPHERECTOMY  09/17/2010   SHOULDER OPEN ROTATOR CUFF REPAIR Right 03/29/2013   Procedure: ROTATOR CUFF REPAIR SHOULDER OPEN WITH SUBACROMIAL DECOMPRESSION AND DISTAL CLAVICLE RESECTION;  Surgeon: Cammie Sickle., MD;  Location: [facility];  Service: Orthopedics;  Laterality: Right;   ULNAR NERVE TRANSPOSITION Right 09/21/2012   Procedure: RIGHT IN SITU DECOMPRESSION ULNAR NERVE;  Surgeon: Cammie Sickle., MD;  Location: [facility];  Service: Orthopedics;  Laterality: Right;    Prior to Admission medications   Medication Sig Start Date End Date Taking? Authorizing Provider  amitriptyline (ELAVIL) 100 MG tablet Take 300 mg by mouth at bedtime.    Yes [provider]  aspirin EC 81 MG tablet Take 81 mg by mouth daily. Swallow whole.   Yes [provider]  famotidine (PEPCID) 40 MG tablet  02/06/20  Yes [provider]  metFORMIN (GLUCOPHAGE) 1000 MG tablet Take 0.5 tablets (500 mg total) by mouth daily with breakfast.  Patient taking differently: Take 500 mg by mouth 2 (two) times daily with a meal. 08/07/20  Yes Renato Shin, MD  metoprolol succinate (TOPROL-XL) 50 MG 24 hr tablet Take 1 tablet (50 mg total) by mouth every evening. Take with or immediately following a meal. 10/30/20 02/26/21 Yes Leonie Man, MD  ONE TOUCH ULTRA TEST test strip Daily. 06/05/11  Yes [provider]  rosuvastatin (CRESTOR) 10 MG tablet Take 10 mg by mouth 1 day or 1 dose. 12/06/20  Yes [provider]  losartan (COZAAR) 50 MG tablet Take 1 tablet (50 mg total) by mouth every  morning. Patient not taking: Reported on 02/26/2021 10/30/20 01/28/21  Leonie Man, MD    Current Outpatient Medications  Medication Sig Dispense Refill   amitriptyline (ELAVIL) 100 MG tablet Take 300 mg by mouth at bedtime.      aspirin EC 81 MG tablet Take 81 mg by mouth daily. Swallow whole.     famotidine (PEPCID) 40 MG tablet      metFORMIN (GLUCOPHAGE) 1000 MG tablet Take 0.5 tablets (500 mg total) by mouth daily with breakfast. (Patient taking differently: Take 500 mg by mouth 2 (two) times daily with a meal.) 90 tablet 1   metoprolol succinate (TOPROL-XL) 50 MG 24 hr tablet Take 1 tablet (50 mg total) by mouth every evening. Take with or immediately following a meal. 90 tablet 3   ONE TOUCH ULTRA TEST test strip Daily.     rosuvastatin (CRESTOR) 10 MG tablet Take 10 mg by mouth 1 day or 1 dose.     losartan (COZAAR) 50 MG tablet Take 1 tablet (50 mg total) by mouth every morning. (Patient not taking: Reported on 02/26/2021) 90 tablet 3   Current Facility-Administered Medications  Medication Dose Route Frequency Provider Last Rate Last Admin   0.9 %  sodium chloride infusion  500 mL Intravenous Once Jamirra Curnow, Carlota Raspberry, MD       0.9 %  sodium chloride infusion  500 mL Intravenous Once Toma Erichsen, Carlota Raspberry, MD        Allergies as of 02/26/2021 - Review Complete 02/26/2021  Allergen Reaction Noted   Adhesive [tape] Other (See Comments) 09/13/2012   Hydrocodone Nausea And Vomiting 09/13/2012   Diltiazem Other (See Comments) 10/30/2020    Family History  Problem Relation Age of Onset   Heart attack Mother 44   Diabetes Mellitus II Mother        Insulin-dependent   Coronary artery disease Mother 69   Hyperlipidemia Mother    Hypertension Mother    Diabetes Father    Coronary artery disease Father 26   Heart attack Father 7   Hyperlipidemia Father    Hypertension Father    Lung cancer Sister    Diabetes Mellitus II Sister    Colon cancer Sister 26   Diabetes Mellitus II  Sister    Heart attack Sister    Coronary artery disease Sister    Diabetic kidney disease Sister        ESRD   Colon polyps Sister    Skin cancer Sister    Colon polyps Brother    Esophageal cancer Neg Hx    Stomach cancer Neg Hx    Rectal cancer Neg Hx     Social History   Socioeconomic History   Marital status: Married    Spouse name: Not on file   Number of children: 0   Years of education: Not on file   Highest education  level: Not on file  Occupational History   Not on file  Tobacco Use   Smoking status: Former    Types: Cigarettes    Quit date: 05/07/2010    Years since quitting: 10.8   Smokeless tobacco: Never  Vaping Use   Vaping Use: Never used  Substance and Sexual Activity   Alcohol use: No   Drug use: No   Sexual activity: Not Currently    Partners: Male    Comment: 1st intercoruse- 89, partners- 2, married- 78 yrs  Other Topics Concern   Not on file  Social History Narrative   Married   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   Exercises 3 to 4 days a week   Social Determinants of Health   Financial Resource Strain: Not on file  Food Insecurity: Not on file  Transportation Needs: Not on file  Physical Activity: Not on file  Stress: Not on file  Social Connections: Not on file  Intimate Partner Violence: Not on file    Review of Systems: All other review of systems negative except as mentioned in the HPI.  Physical Exam: Vital signs BP (!) 107/56   Pulse 91   Temp (!) 96.9 F (36.1 C)   Ht 5' (1.524 m)   Wt 205 lb (93 kg)   SpO2 98%   BMI 40.04 kg/m   General:   Alert,  Well-developed, pleasant and cooperative in NAD Lungs:  Clear throughout to auscultation.   Heart:  Regular rate and rhythm Abdomen:  Soft, nontender and nondistended.   Neuro/Psych:  Alert and cooperative. Normal mood and affect. A and O x 3  Jolly Mango, MD [facility]

## 2021-02-26 NOTE — Progress Notes (Signed)
Sedate, gd SR, tolerated procedure well, VSS, report to RN 

## 2021-02-27 ENCOUNTER — Ambulatory Visit: Payer: Medicare HMO | Admitting: Endocrinology

## 2021-02-28 ENCOUNTER — Telehealth: Payer: Self-pay

## 2021-02-28 NOTE — Telephone Encounter (Signed)
Attempted to reach pt. With follow-up call following endoscopic procedure 02/26/2021.  LM on pt. Voice mail to call if she has any questions or concerns.

## 2021-02-28 NOTE — Telephone Encounter (Signed)
Left message on follow up call. 

## 2021-03-05 ENCOUNTER — Other Ambulatory Visit: Payer: Self-pay

## 2021-03-05 ENCOUNTER — Ambulatory Visit: Payer: Medicare HMO | Admitting: Endocrinology

## 2021-03-05 VITALS — BP 180/100 | HR 96 | Ht 60.0 in | Wt 205.4 lb

## 2021-03-05 DIAGNOSIS — E1142 Type 2 diabetes mellitus with diabetic polyneuropathy: Secondary | ICD-10-CM | POA: Diagnosis not present

## 2021-03-05 LAB — POCT GLYCOSYLATED HEMOGLOBIN (HGB A1C): Hemoglobin A1C: 5.9 % — AB (ref 4.0–5.6)

## 2021-03-05 MED ORDER — METFORMIN HCL ER 500 MG PO TB24: 1000.0000 mg | ORAL_TABLET | Freq: Every day | ORAL | 3 refills | Status: DC

## 2021-03-05 NOTE — Progress Notes (Signed)
Subjective:    Patient ID: [name], female    DOB: 07/05/1955, 65 y.o.   MRN: [id number]  HPI Pt returns for f/u of diabetes mellitus:  DM type: 2 Dx'ed: 6503 Complications: PN Therapy: metformin GDM: never DKA: never Severe hypoglycemia: never Pancreatitis: never Pancreatic imaging: never SDOH: none Other: she has never been on insulin; metformin dosage is limited by lightheadedness.   Interval history: She tolerates metformin well. She has reduced to 1/2 of 1000 mg BID.  She says cbg's are well-controlled.  pt states she feels well in general.   Past Medical History:  Diagnosis Date   Agatston coronary artery calcium score greater than 400 07/11/2020   CAC Score 625.  LAD 204, LCx 227, RCA 195 (96th %-ile)   Anesthesia complication    Hard to wake up past knee surgery in ? 2004!   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  A1c 6.2 (February 2022)   GERD (gastroesophageal reflux disease)    History of breast cancer 2011   s/p L lobectomy in 2012 as well as radiation with letrozole x5 years.  (Dr. Jana Hakim)   Hyperlipidemia    Hypertension    under control with med., has been on med. x 6-7 yr.   Lesion of right ulnar nerve 08/2012   S/P radiation therapy /last radiation treatment 07/2010 07/04/10 - 08/11/10   5040 cGy/28 Treatments to Left Breast   Wears dentures    upper   Wears partial dentures    lower    Past Surgical History:  Procedure Laterality Date   CARPAL TUNNEL RELEASE Right 09/21/2012   Procedure: CARPAL TUNNEL RELEASE;  Surgeon: Cammie Sickle., MD;  Location: [facility];  Service: Orthopedics;  Laterality: Right;   CARPAL TUNNEL RELEASE Left 11/30/2012   Procedure: CARPAL TUNNEL RELEASE;  Surgeon: Cammie Sickle., MD;  Location: [facility];  Service:  Orthopedics;  Laterality: Left;   CATARACT EXTRACTION Bilateral 2017   Bil/ right eye on 08/27/2015 and left eye on 09/17/2015 Dr Tommy Rainwater   CHOLECYSTECTOMY  09/16/2005   COLONOSCOPY  2017   KNEE ARTHROSCOPY Left 08/11/2002   PARTIAL MASTECTOMY WITH NEEDLE LOCALIZATION AND AXILLARY SENTINEL LYMPH NODE BX Left 05/21/2010   with re-exc. medial margin lumpectomy site   ROBOTIC ASSISTED LAPAROSCOPIC LYSIS OF ADHESION  09/17/2010   ROBOTIC ASSISTED TOTAL HYSTERECTOMY WITH BILATERAL SALPINGO OOPHERECTOMY  09/17/2010   SHOULDER OPEN ROTATOR CUFF REPAIR Right 03/29/2013   Procedure: ROTATOR CUFF REPAIR SHOULDER OPEN WITH SUBACROMIAL DECOMPRESSION AND DISTAL CLAVICLE RESECTION;  Surgeon: Cammie Sickle., MD;  Location: [facility];  Service: Orthopedics;  Laterality: Right;   ULNAR NERVE TRANSPOSITION Right 09/21/2012   Procedure: RIGHT IN SITU DECOMPRESSION ULNAR NERVE;  Surgeon: Cammie Sickle., MD;  Location: [facility];  Service: Orthopedics;  Laterality: Right;    Social History   Socioeconomic History   Marital status: Married    Spouse name: Not on file   Number of children: 0   Years of education: Not on file   Highest education level: Not on file  Occupational History   Not on file  Tobacco Use   Smoking status: Former    Types: Cigarettes    Quit  date: 05/07/2010    Years since quitting: 10.8   Smokeless tobacco: Never  Vaping Use   Vaping Use: Never used  Substance and Sexual Activity   Alcohol use: No   Drug use: No   Sexual activity: Not Currently    Partners: Male    Comment: 1st intercoruse- 56, partners- 2, married- 51 yrs  Other Topics Concern   Not on file  Social History Narrative   Married   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   Exercises 3 to 4 days a week   Social Determinants of Health   Financial Resource Strain: Not on file  Food Insecurity: Not on file   Transportation Needs: Not on file  Physical Activity: Not on file  Stress: Not on file  Social Connections: Not on file  Intimate Partner Violence: Not on file    Current Outpatient Medications on File Prior to Visit  Medication Sig Dispense Refill   amitriptyline (ELAVIL) 100 MG tablet Take 300 mg by mouth at bedtime.      aspirin EC 81 MG tablet Take 81 mg by mouth daily. Swallow whole.     famotidine (PEPCID) 40 MG tablet      ONE TOUCH ULTRA TEST test strip Daily.     rosuvastatin (CRESTOR) 10 MG tablet Take 10 mg by mouth 1 day or 1 dose.     losartan (COZAAR) 50 MG tablet Take 1 tablet (50 mg total) by mouth every morning. (Patient not taking: Reported on 02/26/2021) 90 tablet 3   metoprolol succinate (TOPROL-XL) 50 MG 24 hr tablet Take 1 tablet (50 mg total) by mouth every evening. Take with or immediately following a meal. 90 tablet 3   Current Facility-Administered Medications on File Prior to Visit  Medication Dose Route Frequency Provider Last Rate Last Admin   0.9 %  sodium chloride infusion  500 mL Intravenous Once Armbruster, Carlota Raspberry, MD        Allergies  Allergen Reactions   Adhesive [Tape] Other (See Comments)    BLISTERS   Hydrocodone Nausea And Vomiting   Diltiazem Other (See Comments)    constipation    Family History  Problem Relation Age of Onset   Heart attack Mother 8   Diabetes Mellitus II Mother        Insulin-dependent   Coronary artery disease Mother 55   Hyperlipidemia Mother    Hypertension Mother    Diabetes Father    Coronary artery disease Father 76   Heart attack Father 32   Hyperlipidemia Father    Hypertension Father    Lung cancer Sister    Diabetes Mellitus II Sister    Colon cancer Sister 1   Diabetes Mellitus II Sister    Heart attack Sister    Coronary artery disease Sister    Diabetic kidney disease Sister        ESRD   Colon polyps Sister    Skin cancer Sister    Colon polyps Brother    Esophageal cancer Neg Hx     Stomach cancer Neg Hx    Rectal cancer Neg Hx     BP (!) 180/100 (BP Location: Right Arm, Patient Position: Sitting, Cuff Size: Large)   Pulse 96   Ht 5' (1.524 m)   Wt 205 lb 6.4 oz (93.2 kg)   SpO2 98%   BMI 40.11 kg/m    Review of Systems  Objective:   Physical Exam Pulses: dorsalis pedis intact bilat.   MSK: no deformity of the feet CV: trace bilat leg edema, and bilat vv's Skin:  no ulcer on the feet.  normal color and temp on the feet.  Neuro: sensation is intact to touch on the feet.     Lab Results  Component Value Date   CREATININE 1.05 (H) 12/18/2020   BUN 19 12/18/2020   NA 140 12/18/2020   K 5.3 (H) 12/18/2020   CL 102 12/18/2020   CO2 23 12/18/2020   Lab Results  Component Value Date   HGBA1C 5.9 (A) 03/05/2021       Assessment & Plan:  Type 2 DM: well-controlled.   Patient Instructions  check your blood sugar once a day.  vary the time of day when you check, between before the 3 meals, and at bedtime.  also check if you have symptoms of your blood sugar being too high or too low.  please keep a record of the readings and bring it to your next appointment here (or you can bring the meter itself).  You can write it on any piece of paper.  please call us sooner if your blood sugar goes below 70, or if you have a lot of readings over 200.  Please continue the same metformin. Please come back for a follow-up appointment in 6 months.

## 2021-03-05 NOTE — Patient Instructions (Addendum)
check your blood sugar once a day.  vary the time of day when you check, between before the 3 meals, and at bedtime.  also check if you have symptoms of your blood sugar being too high or too low.  please keep a record of the readings and bring it to your next appointment here (or you can bring the meter itself).  You can write it on any piece of paper.  please call us sooner if your blood sugar goes below 70, or if you have a lot of readings over 200.  Please continue the same metformin. Please come back for a follow-up appointment in 6 months.

## 2021-04-09 ENCOUNTER — Ambulatory Visit: Payer: Medicare HMO | Admitting: Cardiology

## 2021-04-16 ENCOUNTER — Other Ambulatory Visit: Payer: Self-pay

## 2021-04-16 ENCOUNTER — Encounter: Payer: Self-pay | Admitting: Obstetrics & Gynecology

## 2021-04-16 ENCOUNTER — Ambulatory Visit (INDEPENDENT_AMBULATORY_CARE_PROVIDER_SITE_OTHER): Payer: Medicare HMO | Admitting: Obstetrics & Gynecology

## 2021-04-16 VITALS — BP 134/84 | HR 66 | Ht 61.75 in | Wt 208.0 lb

## 2021-04-16 DIAGNOSIS — Z9289 Personal history of other medical treatment: Secondary | ICD-10-CM

## 2021-04-16 DIAGNOSIS — M85852 Other specified disorders of bone density and structure, left thigh: Secondary | ICD-10-CM

## 2021-04-16 DIAGNOSIS — Z853 Personal history of malignant neoplasm of breast: Secondary | ICD-10-CM

## 2021-04-16 DIAGNOSIS — Z6838 Body mass index (BMI) 38.0-38.9, adult: Secondary | ICD-10-CM

## 2021-04-16 DIAGNOSIS — R8290 Unspecified abnormal findings in urine: Secondary | ICD-10-CM | POA: Diagnosis not present

## 2021-04-16 DIAGNOSIS — Z9189 Other specified personal risk factors, not elsewhere classified: Secondary | ICD-10-CM

## 2021-04-16 DIAGNOSIS — Z780 Asymptomatic menopausal state: Secondary | ICD-10-CM

## 2021-04-16 DIAGNOSIS — C50412 Malignant neoplasm of upper-outer quadrant of left female breast: Secondary | ICD-10-CM

## 2021-04-16 DIAGNOSIS — Z01419 Encounter for gynecological examination (general) (routine) without abnormal findings: Secondary | ICD-10-CM

## 2021-04-16 DIAGNOSIS — Z90710 Acquired absence of both cervix and uterus: Secondary | ICD-10-CM | POA: Diagnosis not present

## 2021-04-16 NOTE — Progress Notes (Signed)
[name] 1955-10-28 [id number]   History:    65 y.o.  G0 Married   RP:  Established patient presenting for annual gyn exam    HPI: S/P Total Hysterectomy for Fibroids.  Postmenopause, well on no HRT.  No pelvic pain.  Abstinent.  Urine/BMs normal.  H/O Breast Ca followed by Dr Jana Hakim. Breasts normal.  BMI 38.35. Gardening.  Health labs with Fam MD.  Sister with Colon Ca.  Colono 2017, every 5 yrs.   Past medical history,surgical history, family history and social history were all reviewed and documented in the EPIC chart.  Gynecologic History No LMP recorded. Patient has had a hysterectomy.  Obstetric History OB History  Gravida Para Term Preterm AB Living  0 0 0 0 0 0  SAB IAB Ectopic Multiple Live Births  0 0 0 0 0     ROS: A ROS was performed and pertinent positives and negatives are included in the history.  GENERAL: No fevers or chills. HEENT: No change in vision, no earache, sore throat or sinus congestion. NECK: No pain or stiffness. CARDIOVASCULAR: No chest pain or pressure. No palpitations. PULMONARY: No shortness of breath, cough or wheeze. GASTROINTESTINAL: No abdominal pain, nausea, vomiting or diarrhea, melena or bright red blood per rectum. GENITOURINARY: No urinary frequency, urgency, hesitancy or dysuria. MUSCULOSKELETAL: No joint or muscle pain, no back pain, no recent trauma. DERMATOLOGIC: No rash, no itching, no lesions. ENDOCRINE: No polyuria, polydipsia, no heat or cold intolerance. No recent change in weight. HEMATOLOGICAL: No anemia or easy bruising or bleeding. NEUROLOGIC: No headache, seizures, numbness, tingling or weakness. PSYCHIATRIC: No depression, no loss of interest in normal activity or change in sleep pattern.     Exam:   BP 134/84    Pulse 66    Ht 5' 1.75" (1.568 m)    Wt 208 lb (94.3 kg)    SpO2 98%    BMI 38.35 kg/m   Body mass index is 38.35 kg/m.  General appearance : Well developed well nourished female. No acute  distress HEENT: Eyes: no retinal hemorrhage or exudates,  Neck supple, trachea midline, no carotid bruits, no thyroidmegaly Lungs: Clear to auscultation, no rhonchi or wheezes, or rib retractions  Heart: Regular rate and rhythm, no murmurs or gallops Breast:Examined in sitting and supine position were symmetrical in appearance, no palpable masses or tenderness,  no skin retraction, no nipple inversion, no nipple discharge, no skin discoloration, no axillary or supraclavicular lymphadenopathy Abdomen: no palpable masses or tenderness, no rebound or guarding Extremities: no edema or skin discoloration or tenderness  Pelvic: Vulva: Normal             Vagina: No gross lesions or discharge  Cervix/Uterus absent  Adnexa  Without masses or tenderness  Anus: Normal  U/A: Yellow clear, Nit Neg, Protein Neg, WBC 6-10, RBC Neg, Bacteria Moderate.  U. Culture pending.  Will wait on culture to decide on treatment.   Assessment/Plan:  65 y.o. female for annual exam   1. Well female exam with routine gynecological exam S/P Total Hysterectomy for Fibroids.  Postmenopause, well on no HRT.  No pelvic pain.  Abstinent.  Urine/BMs normal.  H/O Breast Ca followed by Dr Jana Hakim. Breasts normal.  BMI 38.35. Gardening.  Health labs with Fam MD.  Sister with Colon Ca.  Colono 2017, every 5 yrs.   2. At high risk for breast cancer  3. History of total hysterectomy  4. Postmenopause S/P Total Hysterectomy for Fibroids.  Postmenopause, well on no HRT.  No pelvic pain.  Abstinent.   5. Osteopenia of neck of left femur Last BD 09/2020.  6. Abnormal urine odor Mild abnormalities on U/A, will wait on culture results to decide on treatment. - Urinalysis,Complete w/RFL Culture  7. Malignant neoplasm of upper-outer quadrant of left female breast, unspecified estrogen receptor status ([place]) Will be followed by Dr Lindi Adie as Dr Jana Hakim is retiring.  8. Class 2 severe obesity due to excess calories with serious  comorbidity and body mass index (BMI) of 38.0 to 38.9 in adult [facility]) Lower calorie/carb diet.  Increase fitness activities.  9. Personal history of other medical treatment  10. Personal history of breast cancer  Other orders - Urine Culture - REFLEXIVE URINE CULTURE   Princess Bruins MD, 2:53 PM 04/16/2021

## 2021-04-19 ENCOUNTER — Other Ambulatory Visit: Payer: Self-pay | Admitting: *Deleted

## 2021-04-19 LAB — URINE CULTURE
MICRO NUMBER:: 12779886
SPECIMEN QUALITY:: ADEQUATE

## 2021-04-19 LAB — URINALYSIS, COMPLETE W/RFL CULTURE
Bilirubin Urine: NEGATIVE
Glucose, UA: NEGATIVE
Hgb urine dipstick: NEGATIVE
Hyaline Cast: NONE SEEN /LPF
Nitrites, Initial: NEGATIVE
Protein, ur: NEGATIVE
RBC / HPF: NONE SEEN /HPF (ref 0–2)
Specific Gravity, Urine: 1.015 (ref 1.001–1.035)
pH: 7 (ref 5.0–8.0)

## 2021-04-19 LAB — CULTURE INDICATED

## 2021-04-19 MED ORDER — SULFAMETHOXAZOLE-TRIMETHOPRIM 800-160 MG PO TABS: 1.0000 | ORAL_TABLET | Freq: Two times a day (BID) | ORAL | 0 refills | Status: DC

## 2021-04-22 ENCOUNTER — Encounter: Payer: Self-pay | Admitting: Obstetrics & Gynecology

## 2021-05-06 ENCOUNTER — Ambulatory Visit (INDEPENDENT_AMBULATORY_CARE_PROVIDER_SITE_OTHER): Payer: Medicare HMO | Admitting: Cardiology

## 2021-05-06 ENCOUNTER — Encounter: Payer: Self-pay | Admitting: Cardiology

## 2021-05-06 ENCOUNTER — Other Ambulatory Visit: Payer: Self-pay

## 2021-05-06 VITALS — BP 120/72 | HR 98 | Resp 20 | Ht 60.0 in | Wt 211.4 lb

## 2021-05-06 DIAGNOSIS — R0609 Other forms of dyspnea: Secondary | ICD-10-CM | POA: Diagnosis not present

## 2021-05-06 DIAGNOSIS — E118 Type 2 diabetes mellitus with unspecified complications: Secondary | ICD-10-CM

## 2021-05-06 DIAGNOSIS — Z8249 Family history of ischemic heart disease and other diseases of the circulatory system: Secondary | ICD-10-CM

## 2021-05-06 DIAGNOSIS — E1169 Type 2 diabetes mellitus with other specified complication: Secondary | ICD-10-CM

## 2021-05-06 DIAGNOSIS — R000 Tachycardia, unspecified: Secondary | ICD-10-CM | POA: Diagnosis not present

## 2021-05-06 DIAGNOSIS — E8881 Metabolic syndrome: Secondary | ICD-10-CM

## 2021-05-06 DIAGNOSIS — E785 Hyperlipidemia, unspecified: Secondary | ICD-10-CM

## 2021-05-06 DIAGNOSIS — R931 Abnormal findings on diagnostic imaging of heart and coronary circulation: Secondary | ICD-10-CM

## 2021-05-06 DIAGNOSIS — I25811 Atherosclerosis of native coronary artery of transplanted heart without angina pectoris: Secondary | ICD-10-CM

## 2021-05-06 DIAGNOSIS — I10 Essential (primary) hypertension: Secondary | ICD-10-CM | POA: Diagnosis not present

## 2021-05-06 NOTE — Progress Notes (Signed)
Primary Care Provider: Leonides Sake, MD Cardiologist: Glenetta Hew, MD Electrophysiologist: None Gastroenterologist: Dr. Havery Moros  Clinic Note: Chief Complaint  Patient presents with   Follow-up    Test results.   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  Nonobstructive disease.  Monitor for signs of angina. Plan: Aspirin 81 mg daily, current dose of Toprol and losartan along with Crestor.  Due for lab recheck.      Hyperlipidemia associated with type 2 diabetes mellitus ([place]) - Primary (Chronic)    TAVR follow-up labs we will order today.  Continue low-dose rosuvastatin plus metformin for now.  We will reassess lipids and CMP.  Depending on results, may need to titrate up statin.      Relevant Orders   Lipid panel   Comprehensive metabolic panel   Essential hypertension (Chronic)    Blood pressure looks pretty well controlled today.  Tolerating beta-blocker and ARB current dose.  No changes for now.        Other   Family history of premature coronary artery disease (Chronic)    She also clearly has CAD, albeit nonocclusive. Plan: Continue aggressive risk modification with blood pressure, glycemic and lipid control.  Also continue encourage exercise and Dr. Modification.      Sinus tachycardia by electrocardiogram (Chronic)    Heart rate definitely better on beta-blocker, we still have room to titrate further.  Blood pressure control.  Low threshold to increase beta-blocker dose      Relevant Orders   EKG 12-Lead   DOE (dyspnea on exertion)    Really deconditioned, obese, not very mobile because of joint pain.      Type 2 diabetes mellitus with complication, without long-term current use of insulin  (HCC) (Chronic)   Relevant Orders   Lipid panel   Comprehensive metabolic panel   Metabolic syndrome (Chronic)   Relevant Orders   Lipid panel   Morbid obesity ([place]) (Chronic)    Unfortunate, she has not lost any weight, partly because she has gotten deconditioned and no longer walking as much.  Encourage increase activity and exercise.  The reason why she was short of breath is because she was not doing things, I encouraged her to continue to exercise in order to build up her endurance.  Limited by knee pain.  May want to consider water aerobics or soft impact exercises (stair climber or elliptical machine, or bicycle)      ===================================  HPI:    [name] is a 66 y.o. female with a PMH notable for Metabolic Syndrome (DM-2, HTN, HLD), and family history of CAD with Elevated Coronary Calcium Score (625) who presents today for 4 month f/u  [name] was seen on 10/30/2020 in follow-up from a Coronary Calcium Score ordered after initial consultation in May 2022.  At this time she was not noticing any symptoms whatsoever of chest tightness pressure or dyspnea with rest or exertion.  She had some mild exertional dyspnea related to deconditioning but was walking a mile and a half a day.  Recommended risk factor modification: Changed from Diltiazem to Toprol (noted to have Sinus Tachycardia) Closely follow lipids pending recheck Started on aspirin 81 mg  [name] was last  seen on 01/04/2021 for follow-up evaluation.  Not really noticing exertional dyspnea just if she walks briskly uphill..  She really did not think twice about with dyspnea, but as a result, her colonoscopy was canceled.  We therefore chose to go and proceed with Coronary CTA, in order to allow her to proceed with her testing.  She did note that she is little more deconditioned and had some dyspnea with perhaps some chest tightness if she continued with exertion.. Cor CTA ordered.   Recent  Hospitalizations:  None  Reviewed  CV studies:    The following studies were reviewed today: (if available, images/films reviewed: From Epic Chart or Care Everywhere) Coronary CTA 01/14/2021: Coronary Calcium Score 625 involving all 3 vessels.  CAD RADS 2. Normal LM.  LAD, D1, and LCx and OM1 (at OM takeoff) 25 to 49% proximal and mid, along with 1 to 25% proximal RCA and 25 to 49% mid RCA. CT FFR: RCA 0.98, LM 0.98.  LAD 0.89 proximal, 0.5 mid and 0.76 apical (possibly distal tapering and not 1 focal lesion).  D10.86.  LCx 0.91 proximal, 0.90 [place] supranasal distal with small caliber terminal portion 0.77.-No significant focal lesion by CT FFR.   Interval History:   [name] returns today happy to hear the results of her CTA.  She had gotten concerned when we started this evaluation, and was thinking she had more short of breath than usual.  She stopped doing exercise at that time.  Now that she seen the results, she is now gradually feeling back to doing some activity.  Planning on adding back in her walking.  Now she really only notes dyspnea if she overexerts (climbing uphill or upstairs and also with bending over..  CV Review of Symptoms (Summary) Cardiovascular ROS: positive for - dyspnea on exertion and not really significant.  Just deconditioned now.  negative for - chest pain, edema, irregular heartbeat, loss of consciousness, orthopnea, palpitations, paroxysmal nocturnal dyspnea, rapid heart rate, shortness of breath, or syncope/near syncope or TIA/amaurosis fugax, claudication   REVIEWED OF SYSTEMS   Pertinent symptoms: Otherwise negative. Short of breath bending over. No more complaint of constipation.-Better on beta-blocker. Walking limited by bilateral knee as well as back pain; more deconditioned.  Walks with a walking stick Occasional dizziness.  Mild bruising.  I have reviewed and (if needed) personally updated the patient's problem list, medications, allergies,  past medical and surgical history, social and family history.   PAST MEDICAL HISTORY   Past Medical History:  Diagnosis Date   Agatston coronary artery calcium score greater than 400 07/11/2020   CAC Score 625.  LAD 204, LCx 227, RCA 195 (96th %-ile): Diffuse 25 to 49% lesions in LAD, D1, LCx, OM1 and mid RCA.  CT FFR negative.   Anesthesia complication    Hard to wake up past knee surgery in ? 2004!   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  A1c 6.2 (February 2022)   GERD (gastroesophageal reflux disease)    History of breast cancer 2011   s/p L lobectomy in 2012 as well as radiation with letrozole x5 years.  (Dr. Jana Hakim)   Hyperlipidemia    Hypertension    under control with med., has been on med. x 6-7 yr.   Lesion of  right ulnar nerve 08/2012   S/P radiation therapy /last radiation treatment 07/2010 07/04/10 - 08/11/10   5040 cGy/28 Treatments to Left Breast   Wears dentures    upper   Wears partial dentures    lower    PAST SURGICAL HISTORY   Past Surgical History:  Procedure Laterality Date   CARPAL TUNNEL RELEASE Right 09/21/2012   Procedure: CARPAL TUNNEL RELEASE;  Surgeon: Cammie Sickle., MD;  Location: [facility];  Service: Orthopedics;  Laterality: Right;   CARPAL TUNNEL RELEASE Left 11/30/2012   Procedure: CARPAL TUNNEL RELEASE;  Surgeon: Cammie Sickle., MD;  Location: [facility];  Service: Orthopedics;  Laterality: Left;   CATARACT EXTRACTION Bilateral 2017   Bil/ right eye on 08/27/2015 and left eye on 09/17/2015 Dr Tommy Rainwater   CHOLECYSTECTOMY  09/16/2005   COLONOSCOPY  2017   Coronary CT angiogram  01/13/2021   Coronary Ca++ Score 625 - 3 V CAD. CAD RADS 2. Nll LM.  LAD, D1, LCx &OM1 (@ OM takeoff) 25-49% prox & mid, & w/ 1-25% proxl RCA and 25- 49% mid RCA. CT FFR:  RCA 0.98, LM 0.98.  LAD 0.89 proximal, 0.5 mid and 0.76 apical (possibly distal tapering and not 1 focal lesion).  D10.86.  LCx 0.91 proximal, 0.90 [place] supranasal distal with small caliber terminal portion 0.77.-No significant lesion   KNEE ARTHROSCOPY Left 08/11/2002   PARTIAL MASTECTOMY WITH NEEDLE LOCALIZATION AND AXILLARY SENTINEL LYMPH NODE BX Left 05/21/2010   with re-exc. medial margin lumpectomy site   ROBOTIC ASSISTED LAPAROSCOPIC LYSIS OF ADHESION  09/17/2010   ROBOTIC ASSISTED TOTAL HYSTERECTOMY WITH BILATERAL SALPINGO OOPHERECTOMY  09/17/2010   SHOULDER OPEN ROTATOR CUFF REPAIR Right 03/29/2013   Procedure: ROTATOR CUFF REPAIR SHOULDER OPEN WITH SUBACROMIAL DECOMPRESSION AND DISTAL CLAVICLE RESECTION;  Surgeon: Cammie Sickle., MD;  Location: [facility];  Service: Orthopedics;  Laterality: Right;   ULNAR NERVE TRANSPOSITION Right 09/21/2012   Procedure: RIGHT IN SITU DECOMPRESSION ULNAR NERVE;  Surgeon: Cammie Sickle., MD;  Location: [facility];  Service: Orthopedics;  Laterality: Right;    Immunization History  Administered Date(s) Administered   Influenza-Unspecified 02/09/2020   PFIZER Comirnaty(Gray Top)Covid-19 Tri-Sucrose Vaccine 02/09/2020   Pneumococcal Conjugate-13 06/06/2020   Zoster Recombinat (Shingrix) 05/09/2020    MEDICATIONS/ALLERGIES   Current Meds  Medication Sig   amitriptyline (ELAVIL) 100 MG tablet Take 300 mg by mouth at bedtime.    aspirin EC 81 MG tablet Take 81 mg by mouth daily. Swallow whole.   famotidine (PEPCID) 40 MG tablet    metFORMIN (GLUCOPHAGE-XR) 500 MG 24 hr tablet Take 2 tablets (1,000 mg total) by mouth daily.   ONE TOUCH ULTRA TEST test strip Daily.   rosuvastatin (CRESTOR) 10 MG tablet Take 10 mg by mouth 1 day or 1 dose.   Current Facility-Administered Medications for the 05/06/21 encounter (Office Visit) with Leonie Man, MD  Medication   0.9 %  sodium chloride infusion    Allergies   Allergen Reactions   Adhesive [Tape] Other (See Comments)    BLISTERS   Hydrocodone Nausea And Vomiting   Diltiazem Other (See Comments)    constipation    SOCIAL HISTORY/FAMILY HISTORY   Reviewed in Epic:  Pertinent findings:  Social History   Tobacco Use   Smoking status: Former    Types: Cigarettes    Quit date: 05/07/2010    Years since quitting: 11.0   Smokeless tobacco: Never  Vaping Use   Vaping Use: Never used  Substance Use Topics   Alcohol use: No   Drug use: No   Social History   Social History Narrative   Married   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   Exercises 3 to 4 days a week    OBJCTIVE -PE, EKG, labs   Wt Readings from Last 3 Encounters:  05/06/21 211 lb 6.4 oz (95.9 kg)  04/16/21 208 lb (94.3 kg)  03/05/21 205 lb 6.4 oz (93.2 kg)    Physical Exam: BP 120/72 (BP Location: Left Arm, Patient Position: Sitting, Cuff Size: Normal)    Pulse 98    Resp 20    Ht 5' (1.524 m)    Wt 211 lb 6.4 oz (95.9 kg)    SpO2 97%    BMI 41.29 kg/m   Physical Exam Vitals reviewed.  Constitutional:      General: She is not in acute distress.    Appearance: Normal appearance. She is well-developed. She is obese. She is not toxic-appearing or diaphoretic.     Comments: Morbidly obese, but otherwise well-groomed.  She uses a walking stick to help her balance and with knee pain.  HENT:     Head: Normocephalic and atraumatic.  Neck:     Vascular: No carotid bruit or JVD.  Cardiovascular:     Rate and Rhythm: Normal rate and regular rhythm. No extrasystoles are present.    Chest Wall: PMI is not displaced.     Pulses: Normal pulses and intact distal pulses.     Heart sounds: S1 normal and S2 normal. Heart sounds are distant. No murmur heard.   No friction rub. No gallop.  Pulmonary:     Effort: Pulmonary effort is normal. No respiratory distress.     Breath sounds: Normal breath sounds. No stridor. No  wheezing or rales.  Musculoskeletal:        General: No swelling. Normal range of motion.     Cervical back: Normal range of motion and neck supple.  Neurological:     General: No focal deficit present.     Mental Status: She is alert and oriented to person, place, and time. Mental status is at baseline.     Gait: Gait normal.  Psychiatric:        Mood and Affect: Mood normal.        Behavior: Behavior normal.        Thought Content: Thought content normal.        Judgment: Judgment normal.    Adult ECG Report N/a  Recent Labs: Reviewed-Should be due for labs to be checked soon. Lab Results  Component Value Date   CHOL 161 12/18/2020   HDL 37 (L) 12/18/2020   LDLCALC 89 12/18/2020   TRIG 206 (H) 12/18/2020   CHOLHDL 4.4 12/18/2020   Lab Results  Component Value Date   CREATININE 1.05 (H) 12/18/2020   BUN 19 12/18/2020   NA 140 12/18/2020   K 5.3 (H) 12/18/2020   CL 102 12/18/2020   CO2 23 12/18/2020   CBC Latest Ref Rng & Units 08/02/2015 08/03/2014 07/07/2013  WBC 3.9 - 10.3 10e3/uL 8.8 7.5 8.7  Hemoglobin 11.6 - 15.9 g/dL 12.5 12.5 12.3  Hematocrit 34.8 - 46.6 % 39.2 39.5 38.5  Platelets 145 - 400 10e3/uL 235 244 234    Lab Results  Component Value Date   HGBA1C  5.9 (A) 03/05/2021   Lab Results  Component Value Date   TSH 3.12 08/07/2020    ==================================================  COVID-19 Education: The signs and symptoms of COVID-19 were discussed with the patient and how to seek care for testing (follow up with PCP or arrange E-visit).    I spent a total of 22 minutes with the patient spent in direct patient consultation.  We went through the details of why she had to be rescheduled.  We discussed the level of dyspnea, however activity etc.  We discussed whether coronary CTA is. Additional time spent with chart review  / charting (studies, outside notes, etc): 19 min Total Time: 41 min  Current medicines are reviewed at length with the patient  today.  (+/- concerns) N/A  This visit occurred during the SARS-CoV-2 public health emergency.  Safety protocols were in place, including screening questions prior to the visit, additional usage of staff PPE, and extensive cleaning of exam room while observing appropriate contact time as indicated for disinfecting solutions.  Notice: This dictation was prepared with Dragon dictation along with smaller phrase technology. Any transcriptional errors that result from this process are unintentional and may not be corrected upon review.  Orders Placed This Encounter  Procedures   Lipid panel    Order Specific Question:   Has the patient fasted?    Answer:   Yes   Comprehensive metabolic panel    Order Specific Question:   Has the patient fasted?    Answer:   Yes   EKG 12-Lead     Patient Instructions / Medication Changes & Studies & Tests Ordered   Patient Instructions  Medication Instructions:   No changes  *If you need a refill on your cardiac medications before your next appointment, please call your pharmacy*   Lab Work:  CMP LIPID- fasting If you have labs (blood work) drawn today and your tests are completely normal, you will receive your results only by: [place] (if you have MyChart) OR A paper copy in the mail If you have any lab test that is abnormal or we need to change your treatment, we will call you to review the results.   Testing/Procedures:  Not needed   Follow-Up: At [facility], you and your health needs are our priority.  As part of our continuing mission to provide you with exceptional heart care, we have created designatd Provider Care Teams.  These Care Teams include your primary Cardiologist (physician) and Advanced Practice Providers (APPs -  Physician Assistants and Nurse Practitioners) who all work together to provide you with the care you need, when you need it.  We recommend signing up for the patient portal called "MyChart".  Sign up  information is provided on this After Visit Summary.  MyChart is used to connect with patients for Virtual Visits (Telemedicine).  Patients are able to view lab/test results, encounter notes, upcoming appointments, etc.  Non-urgent messages can be sent to your provider as well.   To learn more about what you can do with MyChart, go to [email or web address].    Your next appointment:   9 month(s)  ( Along with Husband visit)  The format for your next appointment:   In Person  Provider:   Glenetta Hew, MD      Other Instructions It is okay to increase exercise activity     Glenetta Hew, M.D., M.S. Interventional Cardiologist   Pager # [phone] Phone # [phone] [address] [place], [place] [address]  Thank you for choosing Heartcare at [facility]!!

## 2021-05-06 NOTE — Patient Instructions (Addendum)
Medication Instructions:   No changes  *If you need a refill on your cardiac medications before your next appointment, please call your pharmacy*   Lab Work:  CMP LIPID- fasting If you have labs (blood work) drawn today and your tests are completely normal, you will receive your results only by: [place] (if you have MyChart) OR A paper copy in the mail If you have any lab test that is abnormal or we need to change your treatment, we will call you to review the results.   Testing/Procedures:  Not needed   Follow-Up: At [facility], you and your health needs are our priority.  As part of our continuing mission to provide you with exceptional heart care, we have created designatd Provider Care Teams.  These Care Teams include your primary Cardiologist (physician) and Advanced Practice Providers (APPs -  Physician Assistants and Nurse Practitioners) who all work together to provide you with the care you need, when you need it.  We recommend signing up for the patient portal called "MyChart".  Sign up information is provided on this After Visit Summary.  MyChart is used to connect with patients for Virtual Visits (Telemedicine).  Patients are able to view lab/test results, encounter notes, upcoming appointments, etc.  Non-urgent messages can be sent to your provider as well.   To learn more about what you can do with MyChart, go to [email or web address].    Your next appointment:   9 month(s)  ( Along with Husband visit)  The format for your next appointment:   In Person  Provider:   Glenetta Hew, MD      Other Instructions It is okay to increase exercise activity

## 2021-06-07 ENCOUNTER — Other Ambulatory Visit: Payer: Self-pay | Admitting: Family Medicine

## 2021-06-07 DIAGNOSIS — Z87891 Personal history of nicotine dependence: Secondary | ICD-10-CM

## 2021-06-07 DIAGNOSIS — M79605 Pain in left leg: Secondary | ICD-10-CM | POA: Diagnosis not present

## 2021-06-07 DIAGNOSIS — M79604 Pain in right leg: Secondary | ICD-10-CM | POA: Diagnosis not present

## 2021-06-07 DIAGNOSIS — E1169 Type 2 diabetes mellitus with other specified complication: Secondary | ICD-10-CM | POA: Diagnosis not present

## 2021-06-07 DIAGNOSIS — E785 Hyperlipidemia, unspecified: Secondary | ICD-10-CM | POA: Diagnosis not present

## 2021-06-07 DIAGNOSIS — I700 Atherosclerosis of aorta: Secondary | ICD-10-CM | POA: Diagnosis not present

## 2021-06-07 DIAGNOSIS — I10 Essential (primary) hypertension: Secondary | ICD-10-CM | POA: Diagnosis not present

## 2021-06-07 DIAGNOSIS — N1831 Chronic kidney disease, stage 3a: Secondary | ICD-10-CM | POA: Diagnosis not present

## 2021-06-07 DIAGNOSIS — I2510 Atherosclerotic heart disease of native coronary artery without angina pectoris: Secondary | ICD-10-CM | POA: Diagnosis not present

## 2021-06-07 DIAGNOSIS — Z79899 Other long term (current) drug therapy: Secondary | ICD-10-CM | POA: Diagnosis not present

## 2021-06-09 ENCOUNTER — Encounter: Payer: Self-pay | Admitting: Cardiology

## 2021-06-09 NOTE — Assessment & Plan Note (Signed)
Unfortunate, she has not lost any weight, partly because she has gotten deconditioned and no longer walking as much.  Encourage increase activity and exercise.  The reason why she was short of breath is because she was not doing things, I encouraged her to continue to exercise in order to build up her endurance.  Limited by knee pain.  May want to consider water aerobics or soft impact exercises (stair climber or elliptical machine, or bicycle)

## 2021-06-09 NOTE — Assessment & Plan Note (Signed)
Really deconditioned, obese, not very mobile because of joint pain.

## 2021-06-09 NOTE — Assessment & Plan Note (Signed)
Confirmed on Coronary CTA.  Nonobstructive disease.  Monitor for signs of angina. Plan: Aspirin 81 mg daily, current dose of Toprol and losartan along with Crestor.  Due for lab recheck.

## 2021-06-09 NOTE — Assessment & Plan Note (Signed)
Blood pressure looks pretty well controlled today.  Tolerating beta-blocker and ARB current dose.  No changes for now.

## 2021-06-09 NOTE — Assessment & Plan Note (Signed)
She also clearly has CAD, albeit nonocclusive. Plan: Continue aggressive risk modification with blood pressure, glycemic and lipid control.  Also continue encourage exercise and Dr. Modification.

## 2021-06-09 NOTE — Assessment & Plan Note (Signed)
Heart rate definitely better on beta-blocker, we still have room to titrate further.  Blood pressure control.  Low threshold to increase beta-blocker dose

## 2021-06-09 NOTE — Assessment & Plan Note (Signed)
TAVR follow-up labs we will order today.  Continue low-dose rosuvastatin plus metformin for now.  We will reassess lipids and CMP.  Depending on results, may need to titrate up statin.

## 2021-06-10 ENCOUNTER — Ambulatory Visit: Payer: Medicare HMO | Admitting: Endocrinology

## 2021-06-10 ENCOUNTER — Other Ambulatory Visit: Payer: Self-pay

## 2021-06-10 VITALS — BP 100/80 | HR 103 | Ht 60.0 in | Wt 210.0 lb

## 2021-06-10 DIAGNOSIS — M79671 Pain in right foot: Secondary | ICD-10-CM

## 2021-06-10 DIAGNOSIS — E1142 Type 2 diabetes mellitus with diabetic polyneuropathy: Secondary | ICD-10-CM

## 2021-06-10 DIAGNOSIS — M79672 Pain in left foot: Secondary | ICD-10-CM

## 2021-06-10 LAB — POCT GLYCOSYLATED HEMOGLOBIN (HGB A1C): Hemoglobin A1C: 5.9 % — AB (ref 4.0–5.6)

## 2021-06-10 NOTE — Patient Instructions (Addendum)
check your blood sugar once a day.  vary the time of day when you check, between before the 3 meals, and at bedtime.  also check if you have symptoms of your blood sugar being too high or too low.  please keep a record of the readings and bring it to your next appointment here (or you can bring the meter itself).  You can write it on any piece of paper.  please call us sooner if your blood sugar goes below 70, or if you have a lot of readings over 200.   Please continue the same metformin.   Please see a foot specialist.  you will receive a phone call, about a day and time for an appointment.   Please come back for a follow-up appointment in 6 months.

## 2021-06-10 NOTE — Progress Notes (Signed)
Subjective:    Patient ID: [name], female    DOB: 18-Jun-1955, 66 y.o.   MRN: [id number]  HPI Pt returns for f/u of diabetes mellitus:  DM type: 2 Dx'ed: 0272 Complications: PN, PAD, and CAD Therapy: metformin GDM: never DKA: never Severe hypoglycemia: never Pancreatitis: never Pancreatic imaging: never SDOH: none Other: she has never been on insulin; metformin dosage is limited by lightheadedness.   Interval history: She says cbg's are well-controlled.  pt reports paronychial areas of both 2nd toes.      Past Medical History:  Diagnosis Date   Agatston coronary artery calcium score greater than 400 07/11/2020   CAC Score 625.  LAD 204, LCx 227, RCA 195 (96th %-ile): Diffuse 25 to 49% lesions in LAD, D1, LCx, OM1 and mid RCA.  CT FFR negative.   Anesthesia complication    Hard to wake up past knee surgery in ? 2004!   Arthritis    knees   Cancer ([place]) 2011   left breast cancer   Carpal tunnel syndrome of right wrist 08/2012   Cataract    Diabetes mellitus type 2, noninsulin dependent ([place])    On metformin alone.  A1c 6.2 (February 2022)   GERD (gastroesophageal reflux disease)    History of breast cancer 2011   s/p L lobectomy in 2012 as well as radiation with letrozole x5 years.  (Dr. Jana Hakim)   Hyperlipidemia    Hypertension    under control with med., has been on med. x 6-7 yr.   Lesion of right ulnar nerve 08/2012   S/P radiation therapy /last radiation treatment 07/2010 07/04/10 - 08/11/10   5040 cGy/28 Treatments to Left Breast   Wears dentures    upper   Wears partial dentures    lower    Past Surgical History:  Procedure Laterality Date   CARPAL TUNNEL RELEASE Right 09/21/2012   Procedure: CARPAL TUNNEL RELEASE;  Surgeon: Cammie Sickle., MD;  Location: [place];  Service: Orthopedics;  Laterality: Right;   CARPAL TUNNEL RELEASE Left 11/30/2012   Procedure: CARPAL TUNNEL RELEASE;  Surgeon: Cammie Sickle., MD;  Location:  [place];  Service: Orthopedics;  Laterality: Left;   CATARACT EXTRACTION Bilateral 2017   Bil/ right eye on 08/27/2015 and left eye on 09/17/2015 Dr Tommy Rainwater   CHOLECYSTECTOMY  09/16/2005   COLONOSCOPY  2017   Coronary CT angiogram  01/13/2021   Coronary Ca++ Score 625 - 3 V CAD. CAD RADS 2. Nll LM.  LAD, D1, LCx &OM1 (@ OM takeoff) 25-49% prox & mid, & w/ 1-25% proxl RCA and 25- 49% mid RCA. CT FFR: RCA 0.98, LM 0.98.  LAD 0.89 proximal, 0.5 mid and 0.76 apical (possibly distal tapering and not 1 focal lesion).  D10.86.  LCx 0.91 proximal, 0.90 [place] supranasal distal with small caliber terminal portion 0.77.-No significant lesion   KNEE ARTHROSCOPY Left 08/11/2002   PARTIAL MASTECTOMY WITH NEEDLE LOCALIZATION AND AXILLARY SENTINEL LYMPH NODE BX Left 05/21/2010   with re-exc. medial margin lumpectomy site   ROBOTIC ASSISTED LAPAROSCOPIC LYSIS OF ADHESION  09/17/2010   ROBOTIC ASSISTED TOTAL HYSTERECTOMY WITH BILATERAL SALPINGO OOPHERECTOMY  09/17/2010   SHOULDER OPEN ROTATOR CUFF REPAIR Right 03/29/2013   Procedure: ROTATOR CUFF REPAIR SHOULDER OPEN WITH SUBACROMIAL DECOMPRESSION AND DISTAL CLAVICLE RESECTION;  Surgeon: Cammie Sickle., MD;  Location: [place];  Service: Orthopedics;  Laterality: Right;   ULNAR NERVE TRANSPOSITION Right 09/21/2012  Procedure: RIGHT IN SITU DECOMPRESSION ULNAR NERVE;  Surgeon: Cammie Sickle., MD;  Location: [place];  Service: Orthopedics;  Laterality: Right;    Social History   Socioeconomic History   Marital status: Married    Spouse name: Not on file   Number of children: 0   Years of education: Not on file   Highest education level: Not on file  Occupational History   Not on file  Tobacco Use   Smoking status: Former    Types: Cigarettes    Quit date: 05/07/2010    Years since quitting: 11.1   Smokeless tobacco: Never  Vaping Use   Vaping Use: Never used  Substance and Sexual Activity    Alcohol use: No   Drug use: No   Sexual activity: Not Currently    Partners: Male    Birth control/protection: Surgical    Comment: 1st intercourse- 16, partners- 2, married- 55 yrs, Hx of Breast Cancer  Other Topics Concern   Not on file  Social History Narrative   Married   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   Exercises 3 to 4 days a week   Social Determinants of Health   Financial Resource Strain: Not on file  Food Insecurity: Not on file  Transportation Needs: Not on file  Physical Activity: Not on file  Stress: Not on file  Social Connections: Not on file  Intimate Partner Violence: Not on file    Current Outpatient Medications on File Prior to Visit  Medication Sig Dispense Refill   amitriptyline (ELAVIL) 100 MG tablet Take 300 mg by mouth at bedtime.      aspirin EC 81 MG tablet Take 81 mg by mouth daily. Swallow whole.     famotidine (PEPCID) 40 MG tablet      metFORMIN (GLUCOPHAGE-XR) 500 MG 24 hr tablet Take 2 tablets (1,000 mg total) by mouth daily. 180 tablet 3   ONE TOUCH ULTRA TEST test strip Daily.     rosuvastatin (CRESTOR) 10 MG tablet Take 10 mg by mouth 1 day or 1 dose.     losartan (COZAAR) 50 MG tablet Take 1 tablet (50 mg total) by mouth every morning. (Patient not taking: Reported on 02/26/2021) 90 tablet 3   metoprolol succinate (TOPROL-XL) 50 MG 24 hr tablet Take 1 tablet (50 mg total) by mouth every evening. Take with or immediately following a meal. 90 tablet 3   Current Facility-Administered Medications on File Prior to Visit  Medication Dose Route Frequency Provider Last Rate Last Admin   0.9 %  sodium chloride infusion  500 mL Intravenous Once Armbruster, Carlota Raspberry, MD        Allergies  Allergen Reactions   Adhesive [Tape] Other (See Comments)    BLISTERS   Hydrocodone Nausea And Vomiting   Diltiazem Other (See Comments)    constipation    Family History  Problem Relation Age of  Onset   Heart attack Mother 44   Diabetes Mellitus II Mother        Insulin-dependent   Coronary artery disease Mother 3   Hyperlipidemia Mother    Hypertension Mother    Diabetes Father    Coronary artery disease Father 17   Heart attack Father 51   Hyperlipidemia Father    Hypertension Father    Lung cancer Sister    Diabetes Mellitus II Sister    Colon  cancer Sister 73   Diabetes Mellitus II Sister    Heart attack Sister    Coronary artery disease Sister    Diabetic kidney disease Sister        ESRD   Colon polyps Sister    Skin cancer Sister    Colon polyps Brother    Esophageal cancer Neg Hx    Stomach cancer Neg Hx    Rectal cancer Neg Hx     BP 100/80    Pulse (!) 103    Ht 5' (1.524 m)    Wt 210 lb (95.3 kg)    SpO2 99%    BMI 41.01 kg/m    Review of Systems Denies N/D    Objective:   Physical Exam VITAL SIGNS:  See vs page GENERAL: no distress Pulses: dorsalis pedis intact bilat.   MSK: no deformity of the feet CV: 2+ bilat leg edema, and bilat vv's Skin:  no ulcer on the feet.  normal color and temp on the feet. Neuro: sensation is intact to touch on the feet Ext: 2nd toes appear normal bilaterally.   Past Medical History:  Diagnosis Date   Agatston coronary artery calcium score greater than 400 07/11/2020   CAC Score 625.  LAD 204, LCx 227, RCA 195 (96th %-ile): Diffuse 25 to 49% lesions in LAD, D1, LCx, OM1 and mid RCA.  CT FFR negative.   Anesthesia complication    Hard to wake up past knee surgery in ? 2004!   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  A1c 6.2 (February 2022)   GERD (gastroesophageal reflux disease)    History of breast cancer 2011   s/p L lobectomy in 2012 as well as radiation with letrozole x5 years.  (Dr. Jana Hakim)   Hyperlipidemia    Hypertension    under control  with med., has been on med. x 6-7 yr.   Lesion of right ulnar nerve 08/2012   S/P radiation therapy /last radiation treatment 07/2010 07/04/10 - 08/11/10   5040 cGy/28 Treatments to Left Breast   Wears dentures    upper   Wears partial dentures    lower    Past Surgical History:  Procedure Laterality Date   CARPAL TUNNEL RELEASE Right 09/21/2012   Procedure: CARPAL TUNNEL RELEASE;  Surgeon: Cammie Sickle., MD;  Location: [place];  Service: Orthopedics;  Laterality: Right;   CARPAL TUNNEL RELEASE Left 11/30/2012   Procedure: CARPAL TUNNEL RELEASE;  Surgeon: Cammie Sickle., MD;  Location: [place];  Service: Orthopedics;  Laterality: Left;   CATARACT EXTRACTION Bilateral 2017   Bil/ right eye on 08/27/2015 and left eye on 09/17/2015 Dr Tommy Rainwater   CHOLECYSTECTOMY  09/16/2005   COLONOSCOPY  2017   Coronary CT angiogram  01/13/2021   Coronary Ca++ Score 625 - 3 V CAD. CAD RADS 2. Nll LM.  LAD, D1, LCx &OM1 (@ OM takeoff) 25-49% prox & mid, & w/ 1-25% proxl RCA and 25- 49% mid RCA. CT FFR: RCA 0.98, LM 0.98.  LAD 0.89 proximal, 0.5 mid and 0.76 apical (possibly distal tapering and not 1 focal lesion).  D10.86.  LCx 0.91 proximal, 0.90 [place] supranasal distal with small caliber terminal portion 0.77.-No significant lesion   KNEE ARTHROSCOPY Left 08/11/2002  PARTIAL MASTECTOMY WITH NEEDLE LOCALIZATION AND AXILLARY SENTINEL LYMPH NODE BX Left 05/21/2010   with re-exc. medial margin lumpectomy site   ROBOTIC ASSISTED LAPAROSCOPIC LYSIS OF ADHESION  09/17/2010   ROBOTIC ASSISTED TOTAL HYSTERECTOMY WITH BILATERAL SALPINGO OOPHERECTOMY  09/17/2010   SHOULDER OPEN ROTATOR CUFF REPAIR Right 03/29/2013   Procedure: ROTATOR CUFF REPAIR SHOULDER OPEN WITH SUBACROMIAL DECOMPRESSION AND DISTAL CLAVICLE RESECTION;  Surgeon: Cammie Sickle., MD;  Location: [place];  Service: Orthopedics;  Laterality: Right;   ULNAR NERVE TRANSPOSITION Right  09/21/2012   Procedure: RIGHT IN SITU DECOMPRESSION ULNAR NERVE;  Surgeon: Cammie Sickle., MD;  Location: [place];  Service: Orthopedics;  Laterality: Right;    Social History   Socioeconomic History   Marital status: Married    Spouse name: Not on file   Number of children: 0   Years of education: Not on file   Highest education level: Not on file  Occupational History   Not on file  Tobacco Use   Smoking status: Former    Types: Cigarettes    Quit date: 05/07/2010    Years since quitting: 11.1   Smokeless tobacco: Never  Vaping Use   Vaping Use: Never used  Substance and Sexual Activity   Alcohol use: No   Drug use: No   Sexual activity: Not Currently    Partners: Male    Birth control/protection: Surgical    Comment: 1st intercourse- 16, partners- 2, married- 22 yrs, Hx of Breast Cancer  Other Topics Concern   Not on file  Social History Narrative   Married   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   Exercises 3 to 4 days a week   Social Determinants of Health   Financial Resource Strain: Not on file  Food Insecurity: Not on file  Transportation Needs: Not on file  Physical Activity: Not on file  Stress: Not on file  Social Connections: Not on file  Intimate Partner Violence: Not on file    Current Outpatient Medications on File Prior to Visit  Medication Sig Dispense Refill   amitriptyline (ELAVIL) 100 MG tablet Take 300 mg by mouth at bedtime.      aspirin EC 81 MG tablet Take 81 mg by mouth daily. Swallow whole.     famotidine (PEPCID) 40 MG tablet      metFORMIN (GLUCOPHAGE-XR) 500 MG 24 hr tablet Take 2 tablets (1,000 mg total) by mouth daily. 180 tablet 3   ONE TOUCH ULTRA TEST test strip Daily.     rosuvastatin (CRESTOR) 10 MG tablet Take 10 mg by mouth 1 day or 1 dose.     losartan (COZAAR) 50 MG tablet Take 1 tablet (50 mg total) by mouth every morning. (Patient not taking:  Reported on 02/26/2021) 90 tablet 3   metoprolol succinate (TOPROL-XL) 50 MG 24 hr tablet Take 1 tablet (50 mg total) by mouth every evening. Take with or immediately following a meal. 90 tablet 3   Current Facility-Administered Medications on File Prior to Visit  Medication Dose Route Frequency Provider Last Rate Last Admin   0.9 %  sodium chloride infusion  500 mL Intravenous Once Armbruster, Carlota Raspberry, MD        Allergies  Allergen Reactions   Adhesive [Tape] Other (See Comments)    BLISTERS   Hydrocodone Nausea And Vomiting   Diltiazem Other (See Comments)  constipation    Family History  Problem Relation Age of Onset   Heart attack Mother 63   Diabetes Mellitus II Mother        Insulin-dependent   Coronary artery disease Mother 18   Hyperlipidemia Mother    Hypertension Mother    Diabetes Father    Coronary artery disease Father 44   Heart attack Father 20   Hyperlipidemia Father    Hypertension Father    Lung cancer Sister    Diabetes Mellitus II Sister    Colon cancer Sister 11   Diabetes Mellitus II Sister    Heart attack Sister    Coronary artery disease Sister    Diabetic kidney disease Sister        ESRD   Colon polyps Sister    Skin cancer Sister    Colon polyps Brother    Esophageal cancer Neg Hx    Stomach cancer Neg Hx    Rectal cancer Neg Hx     BP 100/80    Pulse (!) 103    Ht 5' (1.524 m)    Wt 210 lb (95.3 kg)    SpO2 99%    BMI 41.01 kg/m    A1c=5.9%    Assessment & Plan:  Type 2 DM: well-controlled.   Toe pain: ref podiatry.   Patient Instructions  check your blood sugar once a day.  vary the time of day when you check, between before the 3 meals, and at bedtime.  also check if you have symptoms of your blood sugar being too high or too low.  please keep a record of the readings and bring it to your next appointment here (or you can bring the meter itself).  You can write it on any piece of paper.  please call us sooner if your blood sugar  goes below 70, or if you have a lot of readings over 200.  Please continue the same metformin. Please see a foot specialist.  you will receive a phone call, about a day and time for an appointment.   Please come back for a follow-up appointment in 6 months.

## 2021-06-19 DIAGNOSIS — C44519 Basal cell carcinoma of skin of other part of trunk: Secondary | ICD-10-CM | POA: Diagnosis not present

## 2021-06-20 DIAGNOSIS — M170 Bilateral primary osteoarthritis of knee: Secondary | ICD-10-CM | POA: Diagnosis not present

## 2021-06-20 DIAGNOSIS — M25561 Pain in right knee: Secondary | ICD-10-CM | POA: Diagnosis not present

## 2021-07-03 ENCOUNTER — Encounter: Payer: Self-pay | Admitting: Podiatry

## 2021-07-03 ENCOUNTER — Other Ambulatory Visit: Payer: Self-pay

## 2021-07-03 ENCOUNTER — Ambulatory Visit: Payer: Medicare HMO | Admitting: Podiatry

## 2021-07-03 ENCOUNTER — Ambulatory Visit (INDEPENDENT_AMBULATORY_CARE_PROVIDER_SITE_OTHER): Payer: Medicare HMO

## 2021-07-03 DIAGNOSIS — L600 Ingrowing nail: Secondary | ICD-10-CM

## 2021-07-03 DIAGNOSIS — M779 Enthesopathy, unspecified: Secondary | ICD-10-CM | POA: Diagnosis not present

## 2021-07-03 NOTE — Patient Instructions (Addendum)

## 2021-07-03 NOTE — Progress Notes (Signed)
Subjective:  ? ?Patient ID: [name], female   DOB: 66 y.o.   MRN: [id number]  ? ?HPI ?Patient states she gets low-level aching in both feet that is mildly bothersome and has ingrown toenails in the third and fourth left lateral borders that are bothersome and states the one we fixed have done very well on the second toes of both feet ? ? ?ROS ? ? ?   ?Objective:  ?Physical Exam  ?Neurovascular status intact with incurvated lateral border third and fourth left that are sore and make shoe gear difficult with mild discomfort of the diffuse nature bilateral but localized and mostly when she wears sandals  ? ?   ?Assessment:  ?Ingrown toenail deformity third and fourth left lateral border with pain along with mild discomfort in both feet which may be due to general structure ? ?   ?Plan:  ?I have recommended correcting the ingrown toenails as the other 2 did so well and she wants this done and I went ahead today explained procedure she signed consent form I infiltrated the left third and fourth toes I removed the lateral border third and fourth toenails and applied chemical 3 applications 30 seconds followed by alcohol lavage sterile dressings gave instructions to leave on 24 hours but take it off earlier if throbbing were to occur and will be seen back to recheck as needed ?   ? ? ?

## 2021-07-09 ENCOUNTER — Other Ambulatory Visit: Payer: Self-pay

## 2021-07-09 ENCOUNTER — Ambulatory Visit
Admission: RE | Admit: 2021-07-09 | Discharge: 2021-07-09 | Disposition: A | Discharge status: Home / Self Care | Payer: Medicare HMO | Source: Ambulatory Visit | Attending: Family Medicine | Admitting: Family Medicine

## 2021-07-09 DIAGNOSIS — J948 Other specified pleural conditions: Secondary | ICD-10-CM | POA: Diagnosis not present

## 2021-07-09 DIAGNOSIS — Z87891 Personal history of nicotine dependence: Secondary | ICD-10-CM

## 2021-07-09 DIAGNOSIS — J432 Centrilobular emphysema: Secondary | ICD-10-CM | POA: Diagnosis not present

## 2021-07-09 DIAGNOSIS — I2510 Atherosclerotic heart disease of native coronary artery without angina pectoris: Secondary | ICD-10-CM | POA: Diagnosis not present

## 2021-08-28 ENCOUNTER — Other Ambulatory Visit: Payer: Self-pay | Admitting: Cardiology

## 2021-09-02 ENCOUNTER — Encounter: Payer: Self-pay | Admitting: Endocrinology

## 2021-09-02 ENCOUNTER — Ambulatory Visit: Payer: Medicare HMO | Admitting: Endocrinology

## 2021-09-02 VITALS — BP 128/86 | HR 97 | Ht 60.0 in | Wt 199.2 lb

## 2021-09-02 DIAGNOSIS — E1142 Type 2 diabetes mellitus with diabetic polyneuropathy: Secondary | ICD-10-CM

## 2021-09-02 DIAGNOSIS — E118 Type 2 diabetes mellitus with unspecified complications: Secondary | ICD-10-CM | POA: Diagnosis not present

## 2021-09-02 LAB — POCT GLYCOSYLATED HEMOGLOBIN (HGB A1C): Hemoglobin A1C: 6.1 % — AB (ref 4.0–5.6)

## 2021-09-02 NOTE — Patient Instructions (Addendum)
check your blood sugar once a day.  vary the time of day when you check, between before the 3 meals, and at bedtime.  also check if you have symptoms of your blood sugar being too high or too low.  please keep a record of the readings and bring it to your next appointment here (or you can bring the meter itself).  You can write it on any piece of paper.  please call us sooner if your blood sugar goes below 70, or if you have a lot of readings over 200.   ?Please continue the same metformin.   ?You should have an endocrinology follow-up appointment in 6 months.   ?

## 2021-09-02 NOTE — Progress Notes (Signed)
? ?Subjective:  ? ? Patient ID: [name], female    DOB: 1955-10-06, 66 y.o.   MRN: [id number] ? ?HPI ?Pt returns for f/u of diabetes mellitus:  ?DM type: 2 ?Dx'ed: 2006 ?Complications: PPN, PAD, and CAD ?Therapy: metformin ?GDM: never ?DKA: never ?Severe hypoglycemia: never.   ?Pancreatitis: never ?Pancreatic imaging: never.  ?SDOH: none ?Other: she has never been on insulin; metformin dosage is limited by lightheadedness.   ?Interval history: She says cbg's are well-controlled.   ?Past Medical History:  ?Diagnosis Date  ? Agatston coronary artery calcium score greater than 400 07/11/2020  ? CAC Score 625.  LAD 204, LCx 227, RCA 195 (96th %-ile): Diffuse 25 to 49% lesions in LAD, D1, LCx, OM1 and mid RCA.  CT FFR negative.  ? Anesthesia complication   ? Hard to wake up past knee surgery in ? 2004!  ? Arthritis   ? knees  ? Cancer [facility]) 2011  ? left breast cancer  ? Carpal tunnel syndrome of right wrist 08/2012  ? Cataract   ? Diabetes mellitus type 2, noninsulin dependent ([place])   ? On metformin alone.  A1c 6.2 (February 2022)  ? GERD (gastroesophageal reflux disease)   ? History of breast cancer 2011  ? s/p L lobectomy in 2012 as well as radiation with letrozole x5 years.  (Dr. Jana Hakim)  ? Hyperlipidemia   ? Hypertension   ? under control with med., has been on med. x 6-7 yr.  ? Lesion of right ulnar nerve 08/2012  ? S/P radiation therapy /last radiation treatment 07/2010 07/04/10 - 08/11/10  ? 5040 cGy/28 Treatments to Left Breast  ? Wears dentures   ? upper  ? Wears partial dentures   ? lower  ? ? ?Past Surgical History:  ?Procedure Laterality Date  ? CARPAL TUNNEL RELEASE Right 09/21/2012  ? Procedure: CARPAL TUNNEL RELEASE;  Surgeon: Cammie Sickle., MD;  Location: [place];  Service: Orthopedics;  Laterality: Right;  ? CARPAL TUNNEL RELEASE Left 11/30/2012  ? Procedure: CARPAL TUNNEL RELEASE;  Surgeon: Cammie Sickle., MD;  Location: [place];  Service: Orthopedics;   Laterality: Left;  ? CATARACT EXTRACTION Bilateral 2017  ? Bil/ right eye on 08/27/2015 and left eye on 09/17/2015 Dr Tommy Rainwater  ? CHOLECYSTECTOMY  09/16/2005  ? COLONOSCOPY  2017  ? Coronary CT angiogram  01/13/2021  ? Coronary Ca++ Score 625 - 3 V CAD. CAD RADS 2. Nll LM.  LAD, D1, LCx &OM1 (@ OM takeoff) 25-49% prox & mid, & w/ 1-25% proxl RCA and 25- 49% mid RCA. CT FFR: RCA 0.98, LM 0.98.  LAD 0.89 proximal, 0.5 mid and 0.76 apical (possibly distal tapering and not 1 focal lesion).  D10.86.  LCx 0.91 proximal, 0.90 [place] supranasal distal with small caliber terminal portion 0.77.-No significant lesion  ? KNEE ARTHROSCOPY Left 08/11/2002  ? PARTIAL MASTECTOMY WITH NEEDLE LOCALIZATION AND AXILLARY SENTINEL LYMPH NODE BX Left 05/21/2010  ? with re-exc. medial margin lumpectomy site  ? ROBOTIC ASSISTED LAPAROSCOPIC LYSIS OF ADHESION  09/17/2010  ? ROBOTIC ASSISTED TOTAL HYSTERECTOMY WITH BILATERAL SALPINGO OOPHERECTOMY  09/17/2010  ? SHOULDER OPEN ROTATOR CUFF REPAIR Right 03/29/2013  ? Procedure: ROTATOR CUFF REPAIR SHOULDER OPEN WITH SUBACROMIAL DECOMPRESSION AND DISTAL CLAVICLE RESECTION;  Surgeon: Cammie Sickle., MD;  Location: [place];  Service: Orthopedics;  Laterality: Right;  ? ULNAR NERVE TRANSPOSITION Right 09/21/2012  ? Procedure: RIGHT IN SITU DECOMPRESSION ULNAR NERVE;  Surgeon:  Cammie Sickle., MD;  Location: [place];  Service: Orthopedics;  Laterality: Right;  ? ? ?Social History  ? ?Socioeconomic History  ? Marital status: Married  ?  Spouse name: Not on file  ? Number of children: 0  ? Years of education: Not on file  ? Highest education level: Not on file  ?Occupational History  ? Not on file  ?Tobacco Use  ? Smoking status: Former  ?  Types: Cigarettes  ?  Quit date: 05/07/2010  ?  Years since quitting: 11.3  ? Smokeless tobacco: Never  ?Vaping Use  ? Vaping Use: Never used  ?Substance and Sexual Activity  ? Alcohol use: No  ? Drug use: No  ? Sexual  activity: Not Currently  ?  Partners: Male  ?  Birth control/protection: Surgical  ?  Comment: 1st intercourse- 33, partners- 2, married- 35 yrs, Hx of Breast Cancer  ?Other Topics Concern  ? Not on file  ?Social History Narrative  ? Married  ? 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 5 cups of coffee a day.  ? Exercises 3 to 4 days a week  ? ?Social Determinants of Health  ? ?Financial Resource Strain: Not on file  ?Food Insecurity: Not on file  ?Transportation Needs: Not on file  ?Physical Activity: Not on file  ?Stress: Not on file  ?Social Connections: Not on file  ?Intimate Partner Violence: Not on file  ? ? ?Current Outpatient Medications on File Prior to Visit  ?Medication Sig Dispense Refill  ? amitriptyline (ELAVIL) 100 MG tablet Take 300 mg by mouth at bedtime.     ? aspirin EC 81 MG tablet Take 81 mg by mouth daily. Swallow whole.    ? famotidine (PEPCID) 40 MG tablet     ? metFORMIN (GLUCOPHAGE-XR) 500 MG 24 hr tablet Take 2 tablets (1,000 mg total) by mouth daily. 180 tablet 3  ? metoprolol succinate (TOPROL-XL) 50 MG 24 hr tablet TAKE 1 TABLET EVERY EVENING, WITH OR IMMEDIATELY FOLLOWING A MEAL. 90 tablet 3  ? ONE TOUCH ULTRA TEST test strip Daily.    ? rosuvastatin (CRESTOR) 10 MG tablet Take 10 mg by mouth 1 day or 1 dose.    ? losartan (COZAAR) 50 MG tablet Take 1 tablet (50 mg total) by mouth every morning. (Patient not taking: Reported on 02/26/2021) 90 tablet 3  ? ?Current Facility-Administered Medications on File Prior to Visit  ?Medication Dose Route Frequency Provider Last Rate Last Admin  ? 0.9 %  sodium chloride infusion  500 mL Intravenous Once Armbruster, Carlota Raspberry, MD      ? ? ?Allergies  ?Allergen Reactions  ? Adhesive [Tape] Other (See Comments)  ?  BLISTERS  ? Hydrocodone Nausea And Vomiting  ? Diltiazem Other (See Comments)  ?  constipation  ? ? ?Family History  ?Problem Relation Age of Onset  ? Heart attack Mother 22  ? Diabetes Mellitus II Mother   ?      Insulin-dependent  ? Coronary artery disease Mother 74  ? Hyperlipidemia Mother   ? Hypertension Mother   ? Diabetes Father   ? Coronary artery disease Father 81  ? Heart attack Father 22  ? Hyperlipidemia Father   ? Hypertension Father   ? Lung cancer Sister   ? Diabetes Mellitus II Sister   ? Colon cancer Sister 18  ? Diabetes Mellitus II Sister   ? Heart attack Sister   ?  Coronary artery disease Sister   ? Diabetic kidney disease Sister   ?     ESRD  ? Colon polyps Sister   ? Skin cancer Sister   ? Colon polyps Brother   ? Esophageal cancer Neg Hx   ? Stomach cancer Neg Hx   ? Rectal cancer Neg Hx   ? ? ?BP 128/86 (BP Location: Left Arm, Patient Position: Sitting, Cuff Size: Normal)   Pulse 97   Ht 5' (1.524 m)   Wt 199 lb 3.2 oz (90.4 kg)   SpO2 96%   BMI 38.90 kg/m?  ? ? ?Review of Systems ?Denies N/D.   ?   ?Objective:  ? Physical Exam ?VITAL SIGNS:  See vs page.   ?GENERAL: no distress.   ? ? ?Lab Results  ?Component Value Date  ? HGBA1C 6.1 (A) 09/02/2021  ? ?   ?Assessment & Plan:  ?Type 2 DM: well-controlled ? ?Patient Instructions  ?check your blood sugar once a day.  vary the time of day when you check, between before the 3 meals, and at bedtime.  also check if you have symptoms of your blood sugar being too high or too low.  please keep a record of the readings and bring it to your next appointment here (or you can bring the meter itself).  You can write it on any piece of paper.  please call us sooner if your blood sugar goes below 70, or if you have a lot of readings over 200.   ?Please continue the same metformin.   ?You should have an endocrinology follow-up appointment in 6 months.   ? ? ?

## 2021-09-19 DIAGNOSIS — E785 Hyperlipidemia, unspecified: Secondary | ICD-10-CM | POA: Diagnosis not present

## 2021-09-19 DIAGNOSIS — Z6841 Body Mass Index (BMI) 40.0 and over, adult: Secondary | ICD-10-CM | POA: Diagnosis not present

## 2021-09-19 DIAGNOSIS — Z0000 Encounter for general adult medical examination without abnormal findings: Secondary | ICD-10-CM | POA: Diagnosis not present

## 2021-09-19 DIAGNOSIS — Z9181 History of falling: Secondary | ICD-10-CM | POA: Diagnosis not present

## 2021-09-19 DIAGNOSIS — Z1331 Encounter for screening for depression: Secondary | ICD-10-CM | POA: Diagnosis not present

## 2021-09-19 DIAGNOSIS — Z139 Encounter for screening, unspecified: Secondary | ICD-10-CM | POA: Diagnosis not present

## 2021-09-19 DIAGNOSIS — E669 Obesity, unspecified: Secondary | ICD-10-CM | POA: Diagnosis not present

## 2021-10-15 DIAGNOSIS — Z1231 Encounter for screening mammogram for malignant neoplasm of breast: Secondary | ICD-10-CM | POA: Diagnosis not present

## 2021-10-17 ENCOUNTER — Encounter: Payer: Self-pay | Admitting: Obstetrics & Gynecology

## 2021-10-17 DIAGNOSIS — L433 Subacute (active) lichen planus: Secondary | ICD-10-CM | POA: Diagnosis not present

## 2021-10-17 DIAGNOSIS — Z85828 Personal history of other malignant neoplasm of skin: Secondary | ICD-10-CM | POA: Diagnosis not present

## 2021-10-17 DIAGNOSIS — L821 Other seborrheic keratosis: Secondary | ICD-10-CM | POA: Diagnosis not present

## 2021-10-17 DIAGNOSIS — B078 Other viral warts: Secondary | ICD-10-CM | POA: Diagnosis not present

## 2021-10-17 DIAGNOSIS — H61001 Unspecified perichondritis of right external ear: Secondary | ICD-10-CM | POA: Diagnosis not present

## 2021-10-21 ENCOUNTER — Other Ambulatory Visit: Payer: Self-pay | Admitting: Cardiology

## 2021-10-21 DIAGNOSIS — R000 Tachycardia, unspecified: Secondary | ICD-10-CM

## 2021-12-04 DIAGNOSIS — C50912 Malignant neoplasm of unspecified site of left female breast: Secondary | ICD-10-CM | POA: Diagnosis not present

## 2021-12-06 DIAGNOSIS — I10 Essential (primary) hypertension: Secondary | ICD-10-CM | POA: Diagnosis not present

## 2021-12-06 DIAGNOSIS — I700 Atherosclerosis of aorta: Secondary | ICD-10-CM | POA: Diagnosis not present

## 2021-12-06 DIAGNOSIS — E785 Hyperlipidemia, unspecified: Secondary | ICD-10-CM | POA: Diagnosis not present

## 2021-12-06 DIAGNOSIS — N1831 Chronic kidney disease, stage 3a: Secondary | ICD-10-CM | POA: Diagnosis not present

## 2021-12-06 DIAGNOSIS — E1169 Type 2 diabetes mellitus with other specified complication: Secondary | ICD-10-CM | POA: Diagnosis not present

## 2021-12-06 DIAGNOSIS — J439 Emphysema, unspecified: Secondary | ICD-10-CM | POA: Diagnosis not present

## 2021-12-06 DIAGNOSIS — I2510 Atherosclerotic heart disease of native coronary artery without angina pectoris: Secondary | ICD-10-CM | POA: Diagnosis not present

## 2021-12-06 DIAGNOSIS — Z23 Encounter for immunization: Secondary | ICD-10-CM | POA: Diagnosis not present

## 2022-02-03 ENCOUNTER — Encounter: Payer: Self-pay | Admitting: Cardiology

## 2022-02-03 ENCOUNTER — Ambulatory Visit: Payer: Medicare HMO | Attending: Cardiology | Admitting: Cardiology

## 2022-02-03 VITALS — BP 158/90 | HR 103 | Ht 60.0 in | Wt 208.0 lb

## 2022-02-03 DIAGNOSIS — E785 Hyperlipidemia, unspecified: Secondary | ICD-10-CM | POA: Diagnosis not present

## 2022-02-03 DIAGNOSIS — R000 Tachycardia, unspecified: Secondary | ICD-10-CM

## 2022-02-03 DIAGNOSIS — E8881 Metabolic syndrome: Secondary | ICD-10-CM | POA: Diagnosis not present

## 2022-02-03 DIAGNOSIS — I25118 Atherosclerotic heart disease of native coronary artery with other forms of angina pectoris: Secondary | ICD-10-CM

## 2022-02-03 DIAGNOSIS — E118 Type 2 diabetes mellitus with unspecified complications: Secondary | ICD-10-CM | POA: Diagnosis not present

## 2022-02-03 DIAGNOSIS — E1169 Type 2 diabetes mellitus with other specified complication: Secondary | ICD-10-CM

## 2022-02-03 DIAGNOSIS — I10 Essential (primary) hypertension: Secondary | ICD-10-CM

## 2022-02-03 DIAGNOSIS — R0609 Other forms of dyspnea: Secondary | ICD-10-CM

## 2022-02-03 DIAGNOSIS — I25811 Atherosclerosis of native coronary artery of transplanted heart without angina pectoris: Secondary | ICD-10-CM

## 2022-02-03 NOTE — Patient Instructions (Signed)
Medication Instructions:   No changes   *If you need a refill on your cardiac medications before your next appointment, please call your pharmacy*   Lab Work:  Not needed    Testing/Procedures:  Not needed  Follow-Up: At [facility], you and your health needs are our priority.  As part of our continuing mission to provide you with exceptional heart care, we have created designated Provider Care Teams.  These Care Teams include your primary Cardiologist (physician) and Advanced Practice Providers (APPs -  Physician Assistants and Nurse Practitioners) who all work together to provide you with the care you need, when you need it.  We recommend signing up for the patient portal called "MyChart".  Sign up information is provided on this After Visit Summary.  MyChart is used to connect with patients for Virtual Visits (Telemedicine).  Patients are able to view lab/test results, encounter notes, upcoming appointments, etc.  Non-urgent messages can be sent to your provider as well.   To learn more about what you can do with MyChart, go to [email or web address].    Your next appointment:   1 month(s)  The format for your next appointment:   In Person  Provider:   Diona Browner, NP    Then, Glenetta Hew, MD will plan to see you again in 12 month(s).    Other instructions   Keep a recording to blood pressure reading and bring to next appointment in a month

## 2022-02-03 NOTE — Progress Notes (Signed)
Primary Care Provider: Leonides Sake, [place] Cardiologist: Glenetta Hew, MD Electrophysiologist: None  Referring Provider: Leonides Sake, MD  Clinic Note: Chief Complaint  Patient presents with   Follow-up    Routine follow-up, doing okay.   Coronary Artery Disease    Nonobstructive disease by CT.  Exertional dyspnea but predated deconditioning.  No real change.   Hypertension    High blood pressure today.  She says at home it was 110/68 yesterday.   ===================================  ASSESSMENT/PLAN   Problem List Items Addressed This Visit       Cardiology Problems   Hyperlipidemia associated with type 2 diabetes mellitus (HCC) (Chronic)    Her lipids notably improved from last year to this year.  LDL down from 89 down to 56.  TG down from 206 to 46.  Doing well on Crestor 20 mg daily.  Continue.      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  She provide quite likely does have some microvascular disease as well as probably some diastolic dysfunction.  As such, afterload reduction and blood pressure control is very important.  Heart rate also is not clinically controlled.  Continue aspirin statin beta-blocker and ARB. Where she did have worsening symptoms of dyspnea, I think we would be inclined to check 2 D Echo to determine if she has hypertensive heart disease.      Essential hypertension (Chronic)    Unusual that her blood pressure still high.  She said yesterday was 202R systolic and today I get 427/06 and 158/90 mmHg.  She is on losartan 50 mg daily along with Toprol 50 mg.  Last check her blood pressure in clinic was well within control.  The fact that she indicates that her home pressures have been as low as 110s makes me leery of being  overly aggressive to treat.  I would like her to keep a blood pressure log and return for 1 month follow-up at that time we can also assess her exertional dyspnea to see if there is improvement in her blood pressure control.  If her pressures remain elevated and she continues to be somewhat dyspneic, next for blood pressure will be to add thiazide diuretic like chlorthalidone.      Relevant Orders   EKG 12-Lead     Other   Sinus tachycardia by electrocardiogram (Chronic)   Borderline diabetes (Chronic)    Was given a diagnosis of diabetes in the past, but her most recent A1c's have been below the threshold.  Not currently on medications, but would potentially consider GLP-1 agonist to assist with weight loss.  In this case it would be Wegovy.      DOE (dyspnea on exertion) (Chronic)    Mostly related to deconditioning and walking limited by musculoskeletal pains.  Her major dyspnea is bending over which probably has to do with her weight.      Metabolic syndrome (Chronic)    Correlates with elevated Coronary Calcium Score and that she is at high risk for developing ischemic CAD.  (The combination of hypertension, hyperlipidemia elevated triglycerides, and obesity.  Thankfully, HDL looks good, and A1c is 6.0 indicating not yet diabetic, but does have prediabetes      Morbid obesity ([place]) (Chronic)    Her weight is actually back up about  10 pounds.  Lost 10 pounds so she is back to the room again.  Continue to encourage diet and exercise changes in the hopes that she can continue to lose weight.  This will help blood pressure, and lipids as well as blood sugars.       ===================================  HPI:    [name] is a 66 y.o. female with a PMH below who presents today for delayed 38-monthfollow-up.  PMH =  Metabolic Syndrome (DM-2, HTN, HLD),  FamHx CAD &  Elevated Cor Ca2+ Score (625): FFRct < diffuse MV CAD --> apical LAD +, & terminal LCx+/ =No obvious  Flow-limiting lesions   Crystal HERSHBERGERwas last seen on May 06, 2021 to discuss results of her Coronary CTA -> unfortunately, she got concerned with results of her coronary calcium score and had stopped exercising.  She was very happy to hear the results that there were no flow-limiting lesions.  Unfortunately she was having a hard time getting back into exercising.  She did try to get back into walking.  Noted exertional dyspnea climbing stairs or or going uphill as well as bending over.  Recent Hospitalizations: none  Reviewed  CV studies:    The following studies were reviewed today: (if available, images/films reviewed: From Epic Chart or Care Everywhere) none:  Interval History:   Crystal MULLANEreturns here today along with her husband really with no major complaints.  She says that she is not all that active anymore.  Has been doing as much walking as she had done before.  She was doing yard work may get short of breath with some yard work.  She also notes that the dizziness and dyspnea with bending over going up flight of stairs.    Unfortunately, her main activity is doing yard work and gardening which makes it difficult when she feels that way. She denies any irregular heartbeats or palpitations but does note that she is always had a fast heart rate.  Other than the dizziness she gets with bending over she denies any syncope or near syncope.  No TIA or amaurosis fugax.  Although she is short of breath easier than she used to, she does note being more deconditioned and denies any chest pain or pressure with rest or exertion. She denies any PND, orthopnea or significant edema.  Just some mild end TIG swelling that goes down which will speed up.  She denies any headache blurred vision or dizziness routinely-besides bending over dizziness.  No TIA or amaurosis fugax.  No claudication.  REVIEWED OF SYSTEMS   Review of Systems  Constitutional:  Positive for malaise/fatigue (Not as much  energy).  HENT:  Negative for congestion.   Eyes:  Negative for blurred vision and double vision.  Respiratory:  Positive for shortness of breath (Mostly with bending over or going upstairs/uphill).   Gastrointestinal:  Negative for blood in stool and melena.  Genitourinary:  Negative for hematuria.  Musculoskeletal:  Positive for back pain (Off-and-on) and joint pain.       Walking limited by knee and back pain as well as deconditioning.  Neurological:  Positive for dizziness (Bending over). Negative for speech change, weakness and headaches.  Endo/Heme/Allergies:  Bruises/bleeds easily (Mild, occasional).  Psychiatric/Behavioral:  Positive for memory loss. Negative for depression. The patient is not nervous/anxious and does not have insomnia.    I have reviewed and (if needed) personally updated the patient's problem list, medications, allergies, past medical and surgical history,  social and family history.   PAST MEDICAL HISTORY   Past Medical History:  Diagnosis Date   [place]at[place] coronary artery calcium score greater than 400 07/11/2020   CAC Score 625.  LAD 204, LCx 227, RCA 195 (96th %-ile): Diffuse 25 to 49% lesions in LAD, D1, LCx, OM1 and mid RCA.  CT FFR negative.   Anesthesia complication    Hard to wake up past knee surgery in ? 2004!   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  A1c 6.2 (February 2022)   GERD (gastroesophageal reflux disease)    History of breast cancer 2011   s/p L lobectomy in 2012 as well as radiation with letrozole x5 years.  (Dr. Jana Hakim)   Hyperlipidemia    Hypertension    under control with med., has been on med. x 6-7 yr.   Lesion of right ulnar nerve 08/2012   S/P radiation therapy /last radiation treatment 07/2010 07/04/10 - 08/11/10   5040 cGy/28 Treatments to Left Breast   Wears dentures     upper   Wears partial dentures    lower    PAST SURGICAL HISTORY   Past Surgical History:  Procedure Laterality Date   CARPAL TUNNEL RELEASE Right 09/21/2012   Procedure: CARPAL TUNNEL RELEASE;  Surgeon: Cammie Sickle., MD;  Location: [facility];  Service: Orthopedics;  Laterality: Right;   CARPAL TUNNEL RELEASE Left 11/30/2012   Procedure: CARPAL TUNNEL RELEASE;  Surgeon: Cammie Sickle., MD;  Location: [facility];  Service: Orthopedics;  Laterality: Left;   CATARACT EXTRACTION Bilateral 2017   Bil/ right eye on 08/27/2015 and left eye on 09/17/2015 Dr Tommy Rainwater   CHOLECYSTECTOMY  09/16/2005   COLONOSCOPY  2017   Coronary CT angiogram  01/13/2021   Coronary Ca++ Score 625 - 3 V CAD. CAD RADS 2. Nll LM.  LAD, D1, LCx &OM1 (@ OM takeoff) 25-49% prox & mid, & w/ 1-25% proxl RCA and 25- 49% mid RCA. CT FFR: RCA 0.98, LM 0.98.  LAD 0.89 proximal, 0.5 mid and 0.76 apical (possibly distal tapering and not 1 focal lesion).  D10.86.  LCx 0.91 proximal, 0.90 [place] supranasal distal with small caliber terminal portion 0.77.-No significant lesion   KNEE ARTHROSCOPY Left 08/11/2002   PARTIAL MASTECTOMY WITH NEEDLE LOCALIZATION AND AXILLARY SENTINEL LYMPH NODE BX Left 05/21/2010   with re-exc. medial margin lumpectomy site   ROBOTIC ASSISTED LAPAROSCOPIC LYSIS OF ADHESION  09/17/2010   ROBOTIC ASSISTED TOTAL HYSTERECTOMY WITH BILATERAL SALPINGO OOPHERECTOMY  09/17/2010   SHOULDER OPEN ROTATOR CUFF REPAIR Right 03/29/2013   Procedure: ROTATOR CUFF REPAIR SHOULDER OPEN WITH SUBACROMIAL DECOMPRESSION AND DISTAL CLAVICLE RESECTION;  Surgeon: Cammie Sickle., MD;  Location: [facility];  Service: Orthopedics;  Laterality: Right;   ULNAR NERVE TRANSPOSITION Right 09/21/2012   Procedure: RIGHT IN SITU DECOMPRESSION ULNAR NERVE;  Surgeon: Cammie Sickle., MD;  Location: [facility];  Service: Orthopedics;  Laterality: Right;     Immunization History  Administered Date(s) Administered   Influenza-Unspecified 02/09/2020   PFIZER Comirnaty(Gray Top)Covid-19 Tri-Sucrose Vaccine 02/09/2020   Pneumococcal Conjugate-13 06/06/2020   Zoster Recombinat (Shingrix) 05/09/2020    MEDICATIONS/ALLERGIES   Current Meds  Medication Sig   amitriptyline (ELAVIL) 100 MG tablet Take  300 mg by mouth at bedtime.    aspirin EC 81 MG tablet Take 81 mg by mouth daily. Swallow whole.   famotidine (PEPCID) 40 MG tablet    losartan (COZAAR) 50 MG tablet Take 1 tablet (50 mg total) by mouth every morning.   metFORMIN (GLUCOPHAGE-XR) 500 MG 24 hr tablet Take 2 tablets (1,000 mg total) by mouth daily.   metoprolol succinate (TOPROL-XL) 50 MG 24 hr tablet TAKE 1 TABLET EVERY EVENING, WITH OR IMMEDIATELY FOLLOWING A MEAL.   ONE TOUCH ULTRA TEST test strip Daily.   rosuvastatin (CRESTOR) 10 MG tablet Take 10 mg by mouth 1 day or 1 dose.     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   Exercises 3 to 4 days a week    OBJCTIVE -PE, EKG, labs   Wt Readings from Last 3 Encounters:  02/03/22 208 lb (94.3 kg)  09/02/21 199 lb 3.2 oz (90.4 kg)  06/10/21 210 lb (95.3 kg)    Physical Exam: BP (!) 158/90   Pulse (!) 103   Ht 5' (1.524 m)   Wt 208 lb (94.3 kg)   SpO2 97%   BMI 40.62 kg/m ;  Vitals:   02/03/22 1101 02/03/22  1236  BP: (!) 168/92 (!) 158/90  Pulse: (!) 103   Height: 5' (1.524 m)   Weight: 208 lb (94.3 kg)   SpO2: 97%   BMI (Calculated): 40.62     Physical Exam Vitals reviewed.  Constitutional:      General: She is not in acute distress.    Appearance: Normal appearance. She is obese. She is not ill-appearing or toxic-appearing.  HENT:     Head: Normocephalic and atraumatic.  Neck:     Vascular: No carotid bruit or JVD.  Cardiovascular:     Rate and Rhythm: Regular rhythm. Tachycardia present. No extrasystoles are present.    Chest Wall: PMI is not displaced.     Pulses: Normal pulses.     Heart sounds: Normal heart sounds, S1 normal and S2 normal. No murmur heard.    No friction rub. No gallop.  Pulmonary:     Effort: Pulmonary effort is normal. No respiratory distress.     Breath sounds: Normal breath sounds. No wheezing, rhonchi or rales.  Chest:     Chest wall: No tenderness.  Musculoskeletal:        General: Swelling (Trivial-1+ ankle swelling) present. Normal range of motion.     Cervical back: Normal range of motion and neck supple.  Skin:    General: Skin is warm and dry.  Neurological:     General: No focal deficit present.     Mental Status: She is alert and oriented to person, place, and time.  Motor: No weakness.     Gait: Gait abnormal.  Psychiatric:        Mood and Affect: Mood normal.        Behavior: Behavior normal.        Thought Content: Thought content normal.        Judgment: Judgment normal.     Adult ECG Report  Rate: 103 ;  Rhythm: sinus tachycardia and otherwise normal axis, intervals and durations. ;   Narrative Interpretation: Stable  Recent Labs:  12/06/2021: TC 129, TG 56, HDL 46, LDL 56.  A1c 6.0.  Hgb 12.2, Cr 0.3,K+ 5.1;  Lab Results  Component Value Date   CHOL 161 12/18/2020   HDL 37 (L) 12/18/2020   LDLCALC 89 12/18/2020   TRIG 206 (H) 12/18/2020   CHOLHDL 4.4 12/18/2020   Lab Results  Component Value Date   CREATININE 1.05  (H) 12/18/2020   BUN 19 12/18/2020   NA 140 12/18/2020   K 5.3 (H) 12/18/2020   CL 102 12/18/2020   CO2 23 12/18/2020      Latest Ref Rng & Units 08/02/2015   10:35 AM 08/03/2014    7:55 AM 07/07/2013    8:40 AM  CBC  WBC 3.9 - 10.3 10e3/uL 8.8  7.5  8.7   Hemoglobin 11.6 - 15.9 g/dL 12.5  12.5  12.3   Hematocrit 34.8 - 46.6 % 39.2  39.5  38.5   Platelets 145 - 400 10e3/uL 235  244  234     Lab Results  Component Value Date   HGBA1C 6.1 (A) 09/02/2021   Lab Results  Component Value Date   TSH 3.12 08/07/2020    ================================================== I spent a total of 16 minutes with the patient spent in direct patient consultation.  Additional time spent with chart review  / charting (studies, outside notes, etc): 15 min Total Time: 31 min  Current medicines are reviewed at length with the patient today.  (+/- concerns) n/a  Notice: This dictation was prepared with Dragon dictation along with smart phrase technology. Any transcriptional errors that result from this process are unintentional and may not be corrected upon review.  Studies Ordered:   Orders Placed This Encounter  Procedures   EKG 12-Lead   No orders of the defined types were placed in this encounter.   Patient Instructions / Medication Changes & Studies & Tests Ordered   Patient Instructions  Medication Instructions:   No changes   *If you need a refill on your cardiac medications before your next appointment, please call your pharmacy*   Lab Work:  Not needed    Testing/Procedures:  Not needed  Follow-Up: At [facility], you and your health needs are our priority.  As part of our continuing mission to provide you with exceptional heart care, we have created designated Provider Care Teams.  These Care Teams include your primary Cardiologist (physician) and Advanced Practice Providers (APPs -  Physician Assistants and Nurse Practitioners) who all work together to provide you  with the care you need, when you need it.  We recommend signing up for the patient portal called "MyChart".  Sign up information is provided on this After Visit Summary.  MyChart is used to connect with patients for Virtual Visits (Telemedicine).  Patients are able to view lab/test results, encounter notes, upcoming appointments, etc.  Non-urgent messages can be sent to your provider as well.   To learn more about what you can do with MyChart, go to [email or web address].  Your next appointment:   1 month(s)  The format for your next appointment:   In Person  Provider:   Diona Browner, NP    Then, Glenetta Hew, MD will plan to see you again in 12 month(s).    Other instructions   Keep a recording to blood pressure reading and bring to next appointment in a month     Leonie Man, MD, MS Glenetta Hew, M.D., M.S. Interventional Cardiologist  [place]  Pager # [phone] Phone # [phone] [address] [place], [place] [address]   Thank you for choosing [facility]!!

## 2022-02-16 ENCOUNTER — Encounter: Payer: Self-pay | Admitting: Cardiology

## 2022-02-16 DIAGNOSIS — I25119 Atherosclerotic heart disease of native coronary artery with unspecified angina pectoris: Secondary | ICD-10-CM | POA: Insufficient documentation

## 2022-02-16 DIAGNOSIS — I2510 Atherosclerotic heart disease of native coronary artery without angina pectoris: Secondary | ICD-10-CM | POA: Insufficient documentation

## 2022-02-16 NOTE — Assessment & Plan Note (Signed)
Mostly related to deconditioning and walking limited by musculoskeletal pains.  Her major dyspnea is bending over which probably has to do with her weight.

## 2022-02-16 NOTE — Assessment & Plan Note (Signed)
Her weight is actually back up about 10 pounds.  Lost 10 pounds so she is back to the room again.  Continue to encourage diet and exercise changes in the hopes that she can continue to lose weight.  This will help blood pressure, and lipids as well as blood sugars.

## 2022-02-16 NOTE — Assessment & Plan Note (Signed)
Her lipids notably improved from last year to this year.  LDL down from 89 down to 56.  TG down from 206 to 46.  Doing well on Crestor 20 mg daily.  Continue.

## 2022-02-16 NOTE — Assessment & Plan Note (Signed)
Unusual that her blood pressure still high.  She said yesterday was 156F systolic and today I get 537/94 and 158/90 mmHg.  She is on losartan 50 mg daily along with Toprol 50 mg.  Last check her blood pressure in clinic was well within control.  The fact that she indicates that her home pressures have been as low as 110s makes me leery of being overly aggressive to treat.  I would like her to keep a blood pressure log and return for 1 month follow-up at that time we can also assess her exertional dyspnea to see if there is improvement in her blood pressure control.  If her pressures remain elevated and she continues to be somewhat dyspneic, next for blood pressure will be to add thiazide diuretic like chlorthalidone.

## 2022-02-16 NOTE — Assessment & Plan Note (Signed)
Was given a diagnosis of diabetes in the past, but her most recent A1c's have been below the threshold.  Not currently on medications, but would potentially consider GLP-1 agonist to assist with weight loss.  In this case it would be Wegovy.

## 2022-02-16 NOTE — Assessment & Plan Note (Signed)
Diffuse mild to moderate disease noted Coronary CTA the apical LAD and distal LCx being finally positive indicating tapered disease.  She provide quite likely does have some microvascular disease as well as probably some diastolic dysfunction.  As such, afterload reduction and blood pressure control is very important.  Heart rate also is not clinically controlled.  Continue aspirin statin beta-blocker and ARB. Where she did have worsening symptoms of dyspnea, I think we would be inclined to check 2 D Echo to determine if she has hypertensive heart disease.

## 2022-02-16 NOTE — Assessment & Plan Note (Signed)
Correlates with elevated Coronary Calcium Score and that she is at high risk for developing ischemic CAD.  (The combination of hypertension, hyperlipidemia elevated triglycerides, and obesity.  Thankfully, HDL looks good, and A1c is 6.0 indicating not yet diabetic, but does have prediabetes

## 2022-03-07 ENCOUNTER — Ambulatory Visit: Payer: Medicare HMO | Admitting: Nurse Practitioner

## 2022-03-09 NOTE — Progress Notes (Unsigned)
Cardiology Office Note:    Date:  03/12/2022   ID:  [name], DOB 1956-01-25, MRN [id number]  PCP:  Leonides Sake, MD   [facility] Providers Cardiologist:  Glenetta Hew, MD     Referring MD: Leonides Sake, MD   Chief Complaint: shortness of breath  History of Present Illness:    [name] is a very pleasant 66 y.o. female with a hx of type II DM, HLD, CAD with diffuse mild to moderate disease on coronary CTA, HTN, metabolic syndrome, former tobacco abuse, and obesity.  Initially referred and seen by Dr. Ellyn Hack 08/2020 for family history of CAD and sinus tachycardia. CT cardiac score revealed calcium score of 625, 96 percentile for age/race/sex. She underwent coronary CTA 12/2020 which revealed mild to moderate CAD in LAD, D1, circumflex, and OM with no flow limiting lesions.   Last cardiology clinic visit was 02/03/2022 with Dr. Ellyn Hack. BP was elevated at the visit and she had recently gained 10 lbs. Having DOE and dizziness. Was not as active as she had been in the past. She was asked to monitor BP for one month and bring to return appointment.   Today, she is here alone or follow-up. Brought home BP log, but timing of BP varies and SBP readings range from 82/48 to 144/87. The majority of BP readings are well-controlled. Continues to have intermittent dizziness, however she cannot correlate these with timing of any of the BP readings. No presyncope, syncope. Discussed variations in BP that can occur with activity, diet, and timing of medications. Limits sodium in diet. Cooks at home, does not fry food, cans her own veggies. No soda, sweet tea, rarely eats out (1 x per month). Admits she likes biscuits and other breads but limits intake. Continues to c/o shortness of breath and fatigue. Walks 1 mile daily in 15-20 minutes. Gets SOB on incline when walking. Feels that weight is stable and denies orthopnea, PND, edema. No chest pain  Past Medical History:  Diagnosis  Date   [place] coronary artery calcium score greater than 400 07/11/2020   CAC Score 625.  LAD 204, LCx 227, RCA 195 (96th %-ile): Diffuse 25 to 49% lesions in LAD, D1, LCx, OM1 and mid RCA.  CT FFR negative.   Anesthesia complication    Hard to wake up past knee surgery in ? 2004!   Arthritis    knees   Cancer ([place]) 2011   left breast cancer   Carpal tunnel syndrome of right wrist 08/2012   Cataract    Diabetes mellitus type 2, noninsulin dependent ([place])    On metformin alone.  A1c 6.2 (February 2022)   GERD (gastroesophageal reflux disease)    History of breast cancer 2011   s/p L lobectomy in 2012 as well as radiation with letrozole x5 years.  (Dr. Jana Hakim)   Hyperlipidemia    Hypertension    under control with med., has been on med. x 6-7 yr.   Lesion of right ulnar nerve 08/2012   S/P radiation therapy /last radiation treatment 07/2010 07/04/10 - 08/11/10   5040 cGy/28 Treatments to Left Breast   Wears dentures    upper   Wears partial dentures    lower    Past Surgical History:  Procedure Laterality Date   CARPAL TUNNEL RELEASE Right 09/21/2012   Procedure: CARPAL TUNNEL RELEASE;  Surgeon: Cammie Sickle., MD;  Location: [place];  Service: Orthopedics;  Laterality: Right;  CARPAL TUNNEL RELEASE Left 11/30/2012   Procedure: CARPAL TUNNEL RELEASE;  Surgeon: Cammie Sickle., MD;  Location: [place];  Service: Orthopedics;  Laterality: Left;   CATARACT EXTRACTION Bilateral 2017   Bil/ right eye on 08/27/2015 and left eye on 09/17/2015 Dr Tommy Rainwater   CHOLECYSTECTOMY  09/16/2005   COLONOSCOPY  2017   Coronary CT angiogram  01/13/2021   Coronary Ca++ Score 625 - 3 V CAD. CAD RADS 2. Nll LM.  LAD, D1, LCx &OM1 (@ OM takeoff) 25-49% prox & mid, & w/ 1-25% proxl RCA and 25- 49% mid RCA. CT FFR: RCA 0.98, LM 0.98.  LAD 0.89 proximal, 0.5 mid and 0.76 apical (possibly distal tapering and not 1 focal lesion).  D10.86.  LCx 0.91 proximal, 0.90  [place] supranasal distal with small caliber terminal portion 0.77.-No significant lesion   KNEE ARTHROSCOPY Left 08/11/2002   PARTIAL MASTECTOMY WITH NEEDLE LOCALIZATION AND AXILLARY SENTINEL LYMPH NODE BX Left 05/21/2010   with re-exc. medial margin lumpectomy site   ROBOTIC ASSISTED LAPAROSCOPIC LYSIS OF ADHESION  09/17/2010   ROBOTIC ASSISTED TOTAL HYSTERECTOMY WITH BILATERAL SALPINGO OOPHERECTOMY  09/17/2010   SHOULDER OPEN ROTATOR CUFF REPAIR Right 03/29/2013   Procedure: ROTATOR CUFF REPAIR SHOULDER OPEN WITH SUBACROMIAL DECOMPRESSION AND DISTAL CLAVICLE RESECTION;  Surgeon: Cammie Sickle., MD;  Location: [place];  Service: Orthopedics;  Laterality: Right;   ULNAR NERVE TRANSPOSITION Right 09/21/2012   Procedure: RIGHT IN SITU DECOMPRESSION ULNAR NERVE;  Surgeon: Cammie Sickle., MD;  Location: [place];  Service: Orthopedics;  Laterality: Right;    Current Medications: Current Meds  Medication Sig   amitriptyline (ELAVIL) 100 MG tablet Take 300 mg by mouth at bedtime.    aspirin EC 81 MG tablet Take 81 mg by mouth daily. Swallow whole.   famotidine (PEPCID) 40 MG tablet    losartan (COZAAR) 50 MG tablet Take 1 tablet (50 mg total) by mouth every morning.   metFORMIN (GLUCOPHAGE-XR) 500 MG 24 hr tablet Take 2 tablets (1,000 mg total) by mouth daily.   metoprolol succinate (TOPROL-XL) 50 MG 24 hr tablet TAKE 1 TABLET EVERY EVENING, WITH OR IMMEDIATELY FOLLOWING A MEAL.   ONE TOUCH ULTRA TEST test strip Daily.   rosuvastatin (CRESTOR) 10 MG tablet Take 10 mg by mouth 1 day or 1 dose.   Current Facility-Administered Medications for the 03/12/22 encounter (Office Visit) with [name], Crystal Schwab, NP  Medication   0.9 %  sodium chloride infusion     Allergies:   Adhesive [tape], Hydrocodone, and Diltiazem   Social History   Socioeconomic History   Marital status: Married    Spouse name: Not on file   Number of children: 0   Years of  education: Not on file   Highest education level: Not on file  Occupational History   Not on file  Tobacco Use   Smoking status: Former    Types: Cigarettes    Quit date: 05/07/2010    Years since quitting: 11.8   Smokeless tobacco: Never  Vaping Use   Vaping Use: Never used  Substance and Sexual Activity   Alcohol use: No   Drug use: No   Sexual activity: Not Currently    Partners: Male    Birth control/protection: Surgical    Comment: 1st intercourse- 16, partners- 2, married- 10 yrs, Hx of Breast Cancer  Other Topics Concern   Not on file  Social History Narrative   Married  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   Exercises 3 to 4 days a week   Social Determinants of Health   Financial Resource Strain: Not on file  Food Insecurity: Not on file  Transportation Needs: Not on file  Physical Activity: Not on file  Stress: Not on file  Social Connections: Not on file     Family History: The patient's family history includes Colon cancer (age of onset: 57) in her sister; Colon polyps in her brother and sister; Coronary artery disease in her sister; Coronary artery disease (age of onset: 26) in her father; Coronary artery disease (age of onset: 83) in her mother; Diabetes in her father; Diabetes Mellitus II in her mother, sister, and sister; Diabetic kidney disease in her sister; Heart attack in her sister; Heart attack (age of onset: 63) in her father; Heart attack (age of onset: 19) in her mother; Hyperlipidemia in her father and mother; Hypertension in her father and mother; Lung cancer in her sister; Skin cancer in her sister. There is no history of Esophageal cancer, Stomach cancer, or Rectal cancer.  ROS:   Please see the history of present illness.    + DOE All other systems reviewed and are negative.  Labs/Other Studies Reviewed:    The following studies were reviewed today:  Coronary CTA 12/2020  Calcium Score:  3 vessel calcium noted Coronary Arteries: Right dominant with no anomalies LM: Normal LAD: 25-49% calcified plaque proximal and mid vessel D1: 25-49% calcified plaque proximally Circumflex: 25-49% calcified plaque proximally 25-49% calcified plaque mid vessel at OM take off OM1: 25-49% calcified plaque ostium/proximal AV Groove: Small vessel not well seen   RCA: 1-25% calcified plaque proximally 25-49% calcified plaque distally contiguous with ostial PDA plaque PDA: 25-49% calcified plaque ostium and proximal vessel PLA: Normal   IMPRESSION: 1. Calcium score 625 involving 3 vessels which is 96 th percentile for age/sex   2. CAD RADS 2 CAD in 3 vessels see above Study sent for FFR CT given calcification at branch points and moderate stenosis   3.  Normal aortic root 2.6 cm   Normal RCA 0.98   Normal LM 0.97   LAD: 0.89 proximal 0.85 mid and 0.76 distal at apex Delta and overall findings not significant   D1: 0.86   Circumflex: 0.91 proximal 0.90 mid and 0.87 distal and modeled terminal portion 0.77 overall findings not significant   IMPRESSION: Negative FFR CT see above  Recent Labs: No results found for requested labs within last 365 days.  Recent Lipid Panel    Component Value Date/Time   CHOL 161 12/18/2020 0829   TRIG 206 (H) 12/18/2020 0829   HDL 37 (L) 12/18/2020 0829   CHOLHDL 4.4 12/18/2020 0829   LDLCALC 89 12/18/2020 0829     Risk Assessment/Calculations:       Physical Exam:    VS:  BP 134/72   Pulse 90   Ht 5' (1.524 m)   Wt 205 lb (93 kg)   SpO2 99%   BMI 40.04 kg/m     Wt Readings from Last 3 Encounters:  03/12/22 205 lb (93 kg)  02/03/22 208 lb (94.3 kg)  09/02/21 199 lb 3.2 oz (90.4 kg)     GEN:  Well nourished, well developed in no acute distress HEENT: Normal NECK: No JVD; No carotid bruits CARDIAC: RRR, 2/6 systolic murmurs bilateral upper sternal borders, rubs, gallops RESPIRATORY:  Clear to auscultation without rales,  wheezing or rhonchi  ABDOMEN: Soft, non-tender, obese MUSCULOSKELETAL:  No edema; No deformity. 2+ pedal pulses, equal bilaterally SKIN: Warm and dry NEUROLOGIC:  Alert and oriented x 3 PSYCHIATRIC:  Normal affect   EKG:  EKG is not ordered today.    Diagnoses:    1. Coronary artery disease involving native coronary artery of native heart without angina pectoris   2. DOE (dyspnea on exertion)   3. Essential hypertension   4. Hyperlipidemia LDL goal <70   5. Murmur    Assessment and Plan:     DOE: She continues to have DOE despite some recent weight loss, limiting sodium in diet, and well-controlled BP. She denies edema, orthopnea, PND.  We will get echocardiogram for evaluation of heart function. Talked with her about potential findings as I suspect she will likely have some degree of diastolic dysfunction. Will follow-up with recommendations following test. Encouraged continued low-sodium diet, increase physical activity, weight loss, and maintain good BP control.  CAD without angina: Elevated coronary calcium score with mild to moderate CAD on coronary CTA 12/2020, no flow limiting lesions. She denies chest pain. Has dyspnea, on exertion but is able to continue to walk for exercise. Overall feels more fatigued over the last few months. Will get echo for evaluation of heart and valve function.   Murmur: 2/6 systolic murmur best auscultated at bilateral upper sternal borders. Will get echocardiogram for evaluation of structural heart disease.   Hypertension: BP is over all well-controlled. Continue to limit sodium. No medication changes today.   Hyperlipidemia LDL goal < 70: LDL 56 12/06/21. Continue rosuvastatin.     Disposition: 3 months with Dr. Debara Pickett or APP  Medication Adjustments/Labs and Tests Ordered: Current medicines are reviewed at length with the patient today.  Concerns regarding medicines are outlined above.  Orders Placed This Encounter  Procedures   ECHOCARDIOGRAM  COMPLETE   No orders of the defined types were placed in this encounter.   Patient Instructions  Medication Instructions:   Your physician recommends that you continue on your current medications as directed. Please refer to the Current Medication list given to you today.   *If you need a refill on your cardiac medications before your next appointment, please call your pharmacy*   Lab Work:  None ordered.  If you have labs (blood work) drawn today and your tests are completely normal, you will receive your results only by: [place] (if you have MyChart) OR A paper copy in the mail If you have any lab test that is abnormal or we need to change your treatment, we will call you to review the results.   Testing/Procedures:  Your physician has requested that you have an echocardiogram. Echocardiography is a painless test that uses sound waves to create images of your heart. It provides your doctor with information about the size and shape of your heart and how well your heart's chambers and valves are working. This procedure takes approximately one hour. There are no restrictions for this procedure. Please do NOT wear cologne, perfume, aftershave, or lotions (deodorant is allowed). Please arrive 15 minutes prior to your appointment time.    Follow-Up: At [facility], you and your health needs are our priority.  As part of our continuing mission to provide you with exceptional heart care, we have created designated Provider Care Teams.  These Care Teams include your primary Cardiologist (physician) and Advanced Practice Providers (APPs -  Physician Assistants and  Nurse Practitioners) who all work together to provide you with the care you need, when you need it.  We recommend signing up for the patient portal called "MyChart".  Sign up information is provided on this After Visit Summary.  MyChart is used to connect with patients for Virtual Visits (Telemedicine).   Patients are able to view lab/test results, encounter notes, upcoming appointments, etc.  Non-urgent messages can be sent to your provider as well.   To learn more about what you can do with MyChart, go to [email or web address].    Your next appointment:   3 month(s)  The format for your next appointment:   In Person  Provider:   Coletta Memos, FNP        Important Information About Sugar         Signed, Emmaline Life, NP  03/12/2022 1:40 PM    [place]

## 2022-03-12 ENCOUNTER — Ambulatory Visit: Payer: Medicare HMO | Attending: Nurse Practitioner | Admitting: Nurse Practitioner

## 2022-03-12 ENCOUNTER — Encounter: Payer: Self-pay | Admitting: Nurse Practitioner

## 2022-03-12 VITALS — BP 134/72 | HR 90 | Ht 60.0 in | Wt 205.0 lb

## 2022-03-12 DIAGNOSIS — I2510 Atherosclerotic heart disease of native coronary artery without angina pectoris: Secondary | ICD-10-CM | POA: Diagnosis not present

## 2022-03-12 DIAGNOSIS — R0609 Other forms of dyspnea: Secondary | ICD-10-CM

## 2022-03-12 DIAGNOSIS — E785 Hyperlipidemia, unspecified: Secondary | ICD-10-CM | POA: Diagnosis not present

## 2022-03-12 DIAGNOSIS — I10 Essential (primary) hypertension: Secondary | ICD-10-CM

## 2022-03-12 DIAGNOSIS — R011 Cardiac murmur, unspecified: Secondary | ICD-10-CM | POA: Diagnosis not present

## 2022-03-12 NOTE — Patient Instructions (Signed)
Medication Instructions:   Your physician recommends that you continue on your current medications as directed. Please refer to the Current Medication list given to you today.   *If you need a refill on your cardiac medications before your next appointment, please call your pharmacy*   Lab Work:  None ordered.  If you have labs (blood work) drawn today and your tests are completely normal, you will receive your results only by: [place] (if you have MyChart) OR A paper copy in the mail If you have any lab test that is abnormal or we need to change your treatment, we will call you to review the results.   Testing/Procedures:  Your physician has requested that you have an echocardiogram. Echocardiography is a painless test that uses sound waves to create images of your heart. It provides your doctor with information about the size and shape of your heart and how well your heart's chambers and valves are working. This procedure takes approximately one hour. There are no restrictions for this procedure. Please do NOT wear cologne, perfume, aftershave, or lotions (deodorant is allowed). Please arrive 15 minutes prior to your appointment time.    Follow-Up: At [facility], you and your health needs are our priority.  As part of our continuing mission to provide you with exceptional heart care, we have created designated Provider Care Teams.  These Care Teams include your primary Cardiologist (physician) and Advanced Practice Providers (APPs -  Physician Assistants and Nurse Practitioners) who all work together to provide you with the care you need, when you need it.  We recommend signing up for the patient portal called "MyChart".  Sign up information is provided on this After Visit Summary.  MyChart is used to connect with patients for Virtual Visits (Telemedicine).  Patients are able to view lab/test results, encounter notes, upcoming appointments, etc.  Non-urgent messages  can be sent to your provider as well.   To learn more about what you can do with MyChart, go to [email or web address].    Your next appointment:   3 month(s)  The format for your next appointment:   In Person  Provider:   Coletta Memos, FNP        Important Information About Sugar

## 2022-04-07 ENCOUNTER — Ambulatory Visit (HOSPITAL_COMMUNITY): Payer: Medicare HMO | Attending: Cardiovascular Disease

## 2022-04-07 DIAGNOSIS — R0602 Shortness of breath: Secondary | ICD-10-CM | POA: Insufficient documentation

## 2022-04-07 DIAGNOSIS — E785 Hyperlipidemia, unspecified: Secondary | ICD-10-CM | POA: Diagnosis not present

## 2022-04-07 DIAGNOSIS — R42 Dizziness and giddiness: Secondary | ICD-10-CM | POA: Insufficient documentation

## 2022-04-07 DIAGNOSIS — Z923 Personal history of irradiation: Secondary | ICD-10-CM | POA: Insufficient documentation

## 2022-04-07 DIAGNOSIS — I2510 Atherosclerotic heart disease of native coronary artery without angina pectoris: Secondary | ICD-10-CM | POA: Insufficient documentation

## 2022-04-07 DIAGNOSIS — R0600 Dyspnea, unspecified: Secondary | ICD-10-CM | POA: Diagnosis not present

## 2022-04-07 DIAGNOSIS — E669 Obesity, unspecified: Secondary | ICD-10-CM | POA: Insufficient documentation

## 2022-04-07 DIAGNOSIS — R5383 Other fatigue: Secondary | ICD-10-CM | POA: Insufficient documentation

## 2022-04-07 DIAGNOSIS — I10 Essential (primary) hypertension: Secondary | ICD-10-CM | POA: Diagnosis not present

## 2022-04-07 DIAGNOSIS — R011 Cardiac murmur, unspecified: Secondary | ICD-10-CM | POA: Insufficient documentation

## 2022-04-07 DIAGNOSIS — E119 Type 2 diabetes mellitus without complications: Secondary | ICD-10-CM | POA: Insufficient documentation

## 2022-04-07 DIAGNOSIS — Z853 Personal history of malignant neoplasm of breast: Secondary | ICD-10-CM | POA: Diagnosis not present

## 2022-04-07 DIAGNOSIS — R0609 Other forms of dyspnea: Secondary | ICD-10-CM | POA: Insufficient documentation

## 2022-04-07 DIAGNOSIS — Z87891 Personal history of nicotine dependence: Secondary | ICD-10-CM | POA: Insufficient documentation

## 2022-04-07 LAB — ECHOCARDIOGRAM COMPLETE
Area-P 1/2: 4.39 cm2
S' Lateral: 2.5 cm

## 2022-04-10 ENCOUNTER — Other Ambulatory Visit (HOSPITAL_COMMUNITY): Payer: Medicare HMO

## 2022-04-18 ENCOUNTER — Ambulatory Visit: Payer: Medicare HMO | Admitting: Obstetrics & Gynecology

## 2022-05-01 ENCOUNTER — Ambulatory Visit: Payer: Medicare HMO | Admitting: Obstetrics & Gynecology

## 2022-05-19 DIAGNOSIS — E1169 Type 2 diabetes mellitus with other specified complication: Secondary | ICD-10-CM | POA: Diagnosis not present

## 2022-05-19 DIAGNOSIS — E785 Hyperlipidemia, unspecified: Secondary | ICD-10-CM | POA: Diagnosis not present

## 2022-05-19 DIAGNOSIS — R29898 Other symptoms and signs involving the musculoskeletal system: Secondary | ICD-10-CM | POA: Diagnosis not present

## 2022-06-02 ENCOUNTER — Ambulatory Visit: Payer: Medicare HMO | Admitting: General Practice

## 2022-06-25 ENCOUNTER — Ambulatory Visit: Payer: Medicare HMO | Admitting: Internal Medicine

## 2022-06-30 ENCOUNTER — Other Ambulatory Visit: Payer: Self-pay | Admitting: Cardiology

## 2022-07-22 ENCOUNTER — Encounter: Payer: Self-pay | Admitting: Cardiology

## 2022-07-22 NOTE — Telephone Encounter (Signed)
Error

## 2022-08-20 ENCOUNTER — Ambulatory Visit: Payer: Medicare HMO | Admitting: Internal Medicine

## 2022-08-20 ENCOUNTER — Encounter: Payer: Self-pay | Admitting: Internal Medicine

## 2022-08-20 VITALS — BP 124/80 | HR 86 | Ht 60.0 in | Wt 199.0 lb

## 2022-08-20 DIAGNOSIS — E1142 Type 2 diabetes mellitus with diabetic polyneuropathy: Secondary | ICD-10-CM

## 2022-08-20 DIAGNOSIS — Z7984 Long term (current) use of oral hypoglycemic drugs: Secondary | ICD-10-CM

## 2022-08-20 DIAGNOSIS — E119 Type 2 diabetes mellitus without complications: Secondary | ICD-10-CM | POA: Insufficient documentation

## 2022-08-20 DIAGNOSIS — E1159 Type 2 diabetes mellitus with other circulatory complications: Secondary | ICD-10-CM

## 2022-08-20 LAB — POCT GLYCOSYLATED HEMOGLOBIN (HGB A1C): Hemoglobin A1C: 5.7 % — AB (ref 4.0–5.6)

## 2022-08-20 LAB — POCT GLUCOSE (DEVICE FOR HOME USE): Glucose Fasting, POC: 85 mg/dL (ref 70–99)

## 2022-08-20 MED ORDER — METFORMIN HCL ER 500 MG PO TB24: 1000.0000 mg | ORAL_TABLET | Freq: Every day | ORAL | 3 refills | Status: DC

## 2022-08-20 NOTE — Patient Instructions (Signed)
Continue Metformin 500 mg, 2 tablets daily      

## 2022-08-20 NOTE — Progress Notes (Signed)
Name: [name]  Age/ Sex: 67 y.o., female   MRN/ DOB: [id number], 1956-01-08     PCP: Ailene Ravel, MD   Reason for Endocrinology Evaluation: Type 2 Diabetes Mellitus  Initial Endocrine Consultative Visit: 08/18/2014    PATIENT IDENTIFIER: [name] is a 67 y.o. female with a past medical history of DM, dyslipidemia, HTN, and CAD. The patient has followed with Endocrinology clinic since 2016 for consultative assistance with management of her diabetes.  DIABETIC HISTORY:  [name] was diagnosed with DM 2006, patient has been on metformin since diagnosis, no prior use of insulin. Her hemoglobin A1c has ranged from 5.9% in 2023, peaking at 6.3% in 2016.  Complications: PPN, PAD, and CAD Therapy: metformin Other: she has never been on insulin; metformin dosage is limited by lightheadedness.     The patient was followed by Dr. Everardo All from 2016 until 08/2021  SUBJECTIVE:   During the last visit (09/02/2021): Saw Dr. Revonda Standard  Today (08/20/2022): [name] is here for a follow up on diabetes management.   She checks her blood sugars 1-2 times daily. The patient has not had hypoglycemic episodes since the last clinic visit.  Denies nausea, vomiting or diarrhea   HOME DIABETES REGIMEN:  Metformin 500 mg XR, 2 tabs daily      Statin: Yes ACE-I/ARB: Yes   METER DOWNLOAD SUMMARY: n/a    DIABETIC COMPLICATIONS: Microvascular complications:  neuropathy Denies: CKD, retinopathy Last Eye Exam: Completed 01/2022  Macrovascular complications:  CAD Denies:  CVA, PVD   HISTORY:  Past Medical History:  Past Medical History:  Diagnosis Date   [place]at[place] coronary artery calcium score greater than 400 07/11/2020   CAC Score 625.  LAD 204, LCx 227, RCA 195 (96th %-ile): Diffuse 25 to 49% lesions in LAD, D1, LCx, OM1 and mid RCA.  CT FFR negative.   Anesthesia complication    Hard to wake up past knee surgery in ? 2004!   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  A1c 6.2 (February 2022)   GERD (gastroesophageal reflux disease)    History of breast cancer 2011   s/p L lobectomy in 2012 as well as radiation with letrozole x5 years.  (Dr. Darnelle Catalan)   Hyperlipidemia    Hypertension    under control with med., has been on med. x 6-7 yr.   Lesion of right ulnar nerve 08/2012   S/P radiation therapy /last radiation treatment 07/2010 07/04/10 - 08/11/10   5040 cGy/28 Treatments to Left Breast   Wears dentures    upper   Wears partial dentures    lower   Past Surgical History:  Past Surgical History:  Procedure Laterality Date   CARPAL TUNNEL RELEASE Right 09/21/2012   Procedure: CARPAL TUNNEL RELEASE;  Surgeon: Wyn Forster., MD;  Location: [facility];  Service: Orthopedics;  Laterality: Right;   CARPAL TUNNEL RELEASE Left 11/30/2012   Procedure: CARPAL TUNNEL RELEASE;  Surgeon: Wyn Forster., MD;  Location: [facility];  Service: Orthopedics;  Laterality: Left;   CATARACT EXTRACTION Bilateral 2017   Bil/ right eye on 08/27/2015 and left eye on 09/17/2015 Dr Darel Hong   CHOLECYSTECTOMY  09/16/2005   COLONOSCOPY  2017   Coronary CT angiogram  01/13/2021  Coronary Ca++ Score 625 - 3 V CAD. CAD RADS 2. Nll LM.  LAD, D1, LCx &OM1 (@ OM takeoff) 25-49% prox & mid, & w/ 1-25% proxl RCA and 25- 49% mid RCA. CT FFR: RCA 0.98, LM 0.98.  LAD 0.89 proximal, 0.5 mid and 0.76 apical (possibly distal tapering and not 1 focal lesion).  D10.86.  LCx 0.91 proximal, 0.90 [place] supranasal distal with small caliber terminal portion 0.77.-No significant lesion   KNEE ARTHROSCOPY Left 08/11/2002   PARTIAL MASTECTOMY WITH NEEDLE LOCALIZATION AND AXILLARY SENTINEL LYMPH NODE BX Left 05/21/2010   with re-exc. medial margin lumpectomy site   ROBOTIC ASSISTED LAPAROSCOPIC LYSIS OF ADHESION   09/17/2010   ROBOTIC ASSISTED TOTAL HYSTERECTOMY WITH BILATERAL SALPINGO OOPHERECTOMY  09/17/2010   SHOULDER OPEN ROTATOR CUFF REPAIR Right 03/29/2013   Procedure: ROTATOR CUFF REPAIR SHOULDER OPEN WITH SUBACROMIAL DECOMPRESSION AND DISTAL CLAVICLE RESECTION;  Surgeon: Wyn Forster., MD;  Location: [facility];  Service: Orthopedics;  Laterality: Right;   ULNAR NERVE TRANSPOSITION Right 09/21/2012   Procedure: RIGHT IN SITU DECOMPRESSION ULNAR NERVE;  Surgeon: Wyn Forster., MD;  Location: [facility];  Service: Orthopedics;  Laterality: Right;   Social History:  reports that she quit smoking about 12 years ago. Her smoking use included cigarettes. She has never used smokeless tobacco. She reports that she does not drink alcohol and does not use drugs. Family History:  Family History  Problem Relation Age of Onset   Heart attack Mother 35   Diabetes Mellitus II Mother        Insulin-dependent   Coronary artery disease Mother 51   Hyperlipidemia Mother    Hypertension Mother    Diabetes Father    Coronary artery disease Father 32   Heart attack Father 23   Hyperlipidemia Father    Hypertension Father    Lung cancer Sister    Diabetes Mellitus II Sister    Colon cancer Sister 38   Diabetes Mellitus II Sister    Heart attack Sister    Coronary artery disease Sister    Diabetic kidney disease Sister        ESRD   Colon polyps Sister    Skin cancer Sister    Colon polyps Brother    Esophageal cancer Neg Hx    Stomach cancer Neg Hx    Rectal cancer Neg Hx      HOME MEDICATIONS: Allergies as of 08/20/2022       Reactions   Adhesive [tape] Other (See Comments)   BLISTERS   Hydrocodone Nausea And Vomiting   Diltiazem Other (See Comments)   constipation        Medication List        Accurate as of August 20, 2022 12:10 PM. If you have any questions, ask your nurse or doctor.          amitriptyline 100 MG tablet Commonly known  as: ELAVIL Take 300 mg by mouth at bedtime.   aspirin EC 81 MG tablet Take 81 mg by mouth daily. Swallow whole.   famotidine 40 MG tablet Commonly known as: PEPCID   losartan 50 MG tablet Commonly known as: COZAAR Take 1 tablet (50 mg total) by mouth every morning.   metFORMIN 500 MG 24 hr tablet Commonly known as: GLUCOPHAGE-XR Take 2 tablets (1,000 mg total) by mouth daily.   metoprolol succinate 50 MG 24 hr tablet Commonly known as: TOPROL-XL TAKE 1 TABLET EVERY EVENING, WITH OR IMMEDIATELY  FOLLOWING A MEAL.   ONE TOUCH ULTRA TEST test strip Generic drug: glucose blood Daily.   rosuvastatin 10 MG tablet Commonly known as: CRESTOR Take 10 mg by mouth 1 day or 1 dose.         OBJECTIVE:   Vital Signs: BP 124/80 (BP Location: Left Arm, Patient Position: Sitting, Cuff Size: Large)   Pulse 86   Ht 5' (1.524 m)   Wt 199 lb (90.3 kg)   SpO2 99%   BMI 38.86 kg/m   Wt Readings from Last 3 Encounters:  08/20/22 199 lb (90.3 kg)  03/12/22 205 lb (93 kg)  02/03/22 208 lb (94.3 kg)     Exam: General: Pt appears well and is in NAD  Neck: General: Supple without adenopathy. Thyroid: Thyroid size normal.   Lungs: Clear with good BS bilat   Heart: RRR   Extremities: No pretibial edema.   Neuro: MS is good with appropriate affect, pt is alert and Ox3    DM foot exam: 08/20/2022  The skin of the feet is intact without sores or ulcerations. The pedal pulses are 2+ on right and 2+ on left. The sensation is intact to a screening 5.07, 10 gram monofilament bilaterally     DATA REVIEWED:  Lab Results  Component Value Date   HGBA1C 5.7 (A) 08/20/2022   HGBA1C 6.1 (A) 09/02/2021   HGBA1C 5.9 (A) 06/10/2021   Lab Results  Component Value Date   MICROALBUR <0.7 04/13/2015   LDLCALC 89 12/18/2020   CREATININE 1.05 (H) 12/18/2020   Lab Results  Component Value Date   MICRALBCREAT 3.6 04/13/2015     Lab Results  Component Value Date   CHOL 161 12/18/2020    HDL 37 (L) 12/18/2020   LDLCALC 89 12/18/2020   TRIG 206 (H) 12/18/2020   CHOLHDL 4.4 12/18/2020         ASSESSMENT / PLAN / RECOMMENDATIONS:   1) Type 2 Diabetes Mellitus, Optimally controlled, With neuropathic and macrovascular complications - Most recent A1c of 5.7 %. Goal A1c < 7.0 %.    - A1c at goal - No changes     MEDICATIONS: Continue metformin 500 mg 2 tabs daily   EDUCATION / INSTRUCTIONS: BG monitoring instructions: Patient is instructed to check her blood sugars 2-3 times a week. Call [facility] if: BG persistently < 70  I reviewed the Rule of 15 for the treatment of hypoglycemia in detail with the patient. Literature supplied.    2) Diabetic complications:  Eye: Does not have known diabetic retinopathy.  Neuro/ Feet: Does  have known diabetic peripheral neuropathy .  Renal: Patient does not have known baseline CKD. She   is  on an ACEI/ARB at present.     F/U in 6 months     Signed electronically by: Lyndle Herrlich, MD  [facility]  [facility] [address] [place], [place] [address] Phone: [phone] FAX: [phone]   CC: Ailene Ravel, MD [address] [place] [address] Phone: [phone]  Fax: [phone]  Return to Endocrinology clinic as below: Future Appointments  Date Time Provider Department Center  08/22/2022 11:40 AM Marykay Lex, MD [facility] None

## 2022-08-22 ENCOUNTER — Ambulatory Visit: Payer: Medicare HMO | Attending: Cardiology | Admitting: Cardiology

## 2022-08-22 ENCOUNTER — Encounter: Payer: Self-pay | Admitting: Cardiology

## 2022-08-22 VITALS — BP 120/73 | HR 98 | Ht 63.0 in | Wt 198.0 lb

## 2022-08-22 DIAGNOSIS — I2510 Atherosclerotic heart disease of native coronary artery without angina pectoris: Secondary | ICD-10-CM

## 2022-08-22 DIAGNOSIS — R0609 Other forms of dyspnea: Secondary | ICD-10-CM

## 2022-08-22 DIAGNOSIS — E1169 Type 2 diabetes mellitus with other specified complication: Secondary | ICD-10-CM | POA: Diagnosis not present

## 2022-08-22 DIAGNOSIS — R000 Tachycardia, unspecified: Secondary | ICD-10-CM

## 2022-08-22 DIAGNOSIS — E785 Hyperlipidemia, unspecified: Secondary | ICD-10-CM | POA: Diagnosis not present

## 2022-08-22 DIAGNOSIS — I10 Essential (primary) hypertension: Secondary | ICD-10-CM

## 2022-08-22 NOTE — Progress Notes (Signed)
Primary Care Provider: Ailene Ravel, MD [facility] Cardiologist: Crystal Lemma, MD Electrophysiologist: None  Referring Provider: Ailene Ravel, MD  Clinic Note: Chief Complaint  Patient presents with   Follow-up    42-month follow-up, doing okay.  Reviewed echo results.    ===================================  ASSESSMENT/PLAN   Problem List Items Addressed This Visit       Cardiology Problems   Hyperlipidemia associated with type 2 diabetes mellitus (HCC) (Chronic)    Notably improved lipids from August 2022 to August 2023 10 mg rosuvastatin (had been on 20 reduced to 10 for symptoms).  Pretty much at goal. She is on 1000 g twice daily metoprolol for diabetes with an A1c of 5.7.  Would consider the possibility of GLP-1 agonist, if A1c goes up further.      Essential hypertension (Chronic)    Her blood pressure is somewhat labile and has been as low as the 80s and that was the 140s.  Therefore we will discontinue her current medications.  A little leery of the labile nature.  Would not want to add a diuretic at this point.  If anything would probably allow for some mild hypertension episodes because of the hypotension episodes as well.      Relevant Orders   EKG 12-Lead (Completed)   Coronary artery disease involving native coronary artery without angina pectoris - Primary    Diffuse mild to moderate disease noted on coronary CTA with FFR positive findings in the distal LAD and distal circumflex but no obvious culprit lesion.  As such we are planning medical management.  She probably does have some microvascular disease but is doing relatively well overall.  She denies any angina, but does have exertional dyspnea which is probably is much related to deconditioning as it is anything else.  When she walks a mile on a flat ground she denies any symptoms.  As such, continue medical management.  Plan: Aspirin 81 mg daily. Toprol 50 mg daily along with losartan 50  mg daily. Rosuvastatin 10 mg with pretty well-controlled lipids as of August 2023. Her blood pressure will tolerate addition of amlodipine/calcium channel blocker for antianginal effect, but we are using losartan for afterload reduction.        Relevant Orders   EKG 12-Lead (Completed)     Other   Sinus tachycardia by electrocardiogram (Chronic)    Heart rate is better with beta-blocker.  Only on 50 mg daily, but with her having some fatigue issues, I am reluctant to push it further.      DOE (dyspnea on exertion) (Chronic)    I think a lot of her exertional dyspnea is due to deconditioning and musculoskeletal pains, and now she is more deconditioned-not able to do symptomatic exercise because of having to be caregiver for her husband.  Driving back and forth to doctors visits and chemotherapy/XRT.  Would like to see if she could lose weight.  I think she has lost a little bit.  Continue to encourage more weight loss.      Relevant Orders   EKG 12-Lead (Completed)   ===================================  HPI:    [name] is a 67 y.o. female with a PMH below who presents today for 36-month f/u at the request of Hamrick, Maura L, MD.  PMH Metabolic Syndrome (DM-2, HTN, HLD),  FamHx CAD &  Elevated Cor Ca2+ Score (625): FFRct < diffuse MV CAD --> apical LAD +, & terminal LCx+/ =No obvious Flow-limiting lesions  I last saw [name] on February 03, 2022 along with her husband.  No major complaints.  Has been less active, doing less walking that she had done but was doing a lot of yard work.  Would get short of breath with more vigorous yard work.  No chest pain or pressure at rest or exertion.  Notes dizziness and dyspnea bending over or going a flight of steps.  No irregular heartbeats palpitations.  No syncope or near syncope no TIA or more severe symptoms. =>  plan was for her to monitor her blood pressure and bring BP results.  She had also gained about 10 pounds> I  recommended 1 month follow-up to reassess symptoms.  She was seen by Crystal Bridegroom, NP on November 15 for follow-up-BP ranges were anywhere from 82/48-1 44/87.  Majority of well-controlled.  She really could not correlate her dizziness to blood pressure readings.  Will try to do her best to limit biscuits and breads.  Still having shortness of breath with walks if she walks more than a mile.  More frequently noted on inclines or stairs.  Soft SEM heard on exam.  => 2D echo ordered  Recent Hospitalizations: None  Reviewed  CV studies:    The following studies were reviewed today: (if available, images/films reviewed: From Epic Chart or Care Everywhere) TTE 03/2022: Normal LV size and function.  EF 60 to 65%. T no WMA. ~Normal diastolic pressures.  Mild LA dilation.  Normal RV size and function.  Normal RVP and RAP.  Mild aortic valve calcification/sclerosis but no stenosis.  Normal mitral valve.  Interval History:   [name] returns here today for follow-up doing pretty well.  She has been less active of late because her husband has been feeling worse.  She is not walking as much -> usually walks about a mile most days of the week.  Unfortunately, her husband has been much more dyspneic and fatigued, therefore she has not felt comfortable leaving him alone.  She still gets little bit short of breath going up stairs and with bending over but is a little better.  Home BPs are pretty stable now.  She also feels a little occasionally dizzy or off balance with walking.  Because of this she does not feel comfortable walking alone.  She usually walks with a cane or walking stick. Her energy level is okay, but with her being somewhat sedentary now, she is little bit more deconditioned. She denies any chest pain or pressure with rest or exertion.  No PND, orthopnea or edema.  No irregular heartbeats palpitations.  No syncope or near syncope.  No TIA or amaurosis fugax.  No claudication.  REVIEWED  OF SYSTEMS   Review of Systems  Constitutional:  Positive for weight loss (About 7 pounds since last visit.  Intentional). Negative for malaise/fatigue (Energy level is doing better.  But she is more [name] now with her husband being sick.).  Respiratory:  Positive for shortness of breath (Again bending over and if she goes upstairs quickly.  This is probably because she is deconditioned.).   Cardiovascular:  Negative for leg swelling.  Gastrointestinal:  Negative for blood in stool and melena.  Genitourinary:  Negative for hematuria.  Musculoskeletal:  Positive for back pain, falls (Due to loss of balance) and joint pain.  Neurological:  Positive for dizziness (Poor balance. ->  Not necessarily vertigo just proprioception is.). Negative for focal weakness, loss of consciousness and weakness.  Psychiatric/Behavioral:  Negative for depression and memory loss (Mild). The patient is not nervous/anxious and does not have insomnia.     I have reviewed and (if needed) personally updated the patient's problem list, medications, allergies, past medical and surgical history, social and family history.   PAST MEDICAL HISTORY   Past Medical History:  Diagnosis Date   Agatston coronary artery calcium score greater than 400 07/11/2020   CAC Score 625.  LAD 204, LCx 227, RCA 195 (96th %-ile): Diffuse 25 to 49% lesions in LAD, D1, LCx, OM1 and mid RCA.  CT FFR negative.   Anesthesia complication    Hard to wake up past knee surgery in ? 2004!   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  A1c 6.2 (February 2022)   GERD (gastroesophageal reflux disease)    History of breast cancer 2011   s/p [name] lobectomy in 2012 as well as radiation with letrozole x5 years.  (Dr. Darnelle Catalan)   Hyperlipidemia    Hypertension    under control with med.,  has been on med. x 6-7 yr.   Lesion of right ulnar nerve 08/2012   S/P radiation therapy /last radiation treatment 07/2010 07/04/10 - 08/11/10   5040 cGy/28 Treatments to Left Breast   Wears dentures    upper   Wears partial dentures    lower    PAST SURGICAL HISTORY   Past Surgical History:  Procedure Laterality Date   CARPAL TUNNEL RELEASE Right 09/21/2012   Procedure: CARPAL TUNNEL RELEASE;  Surgeon: Wyn Forster., MD;  Location: [facility];  Service: Orthopedics;  Laterality: Right;   CARPAL TUNNEL RELEASE Left 11/30/2012   Procedure: CARPAL TUNNEL RELEASE;  Surgeon: Wyn Forster., MD;  Location: [facility];  Service: Orthopedics;  Laterality: Left;   CATARACT EXTRACTION Bilateral 2017   Bil/ right eye on 08/27/2015 and left eye on 09/17/2015 Dr Darel Hong   CHOLECYSTECTOMY  09/16/2005   COLONOSCOPY  2017   Coronary CT angiogram  01/13/2021   Coronary Ca++ Score 625 - 3 V CAD. CAD RADS 2. Nll LM.  LAD, D1, LCx &OM1 (@ OM takeoff) 25-49% prox & mid, & w/ 1-25% proxl RCA and 25- 49% mid RCA. CT FFR: RCA 0.98, LM 0.98.  LAD 0.89 proximal, 0.5 mid and 0.76 apical (possibly distal tapering and not 1 focal lesion).  D10.86.  LCx 0.91 proximal, 0.90 [place] supranasal distal with small caliber terminal portion 0.77.-No significant lesion   KNEE ARTHROSCOPY Left 08/11/2002   PARTIAL MASTECTOMY WITH NEEDLE LOCALIZATION AND AXILLARY SENTINEL LYMPH NODE BX Left 05/21/2010   with re-exc. medial margin lumpectomy site   ROBOTIC ASSISTED LAPAROSCOPIC LYSIS OF ADHESION  09/17/2010   ROBOTIC ASSISTED TOTAL HYSTERECTOMY WITH BILATERAL SALPINGO OOPHERECTOMY  09/17/2010   SHOULDER OPEN ROTATOR CUFF REPAIR Right 03/29/2013   Procedure: ROTATOR CUFF REPAIR SHOULDER OPEN WITH SUBACROMIAL DECOMPRESSION AND DISTAL CLAVICLE RESECTION;  Surgeon: Wyn Forster., MD;  Location: [facility];  Service: Orthopedics;  Laterality: Right;   ULNAR NERVE  TRANSPOSITION Right 09/21/2012   Procedure: RIGHT IN SITU DECOMPRESSION ULNAR NERVE;  Surgeon: Wyn Forster., MD;  Location:  SURGERY CENTER;  Service: Orthopedics;  Laterality: Right;    MEDICATIONS/ALLERGIES   Current Meds  Medication Sig  amitriptyline (ELAVIL) 100 MG tablet Take 300 mg by mouth at bedtime.    aspirin EC 81 MG tablet Take 81 mg by mouth daily. Swallow whole.   famotidine (PEPCID) 40 MG tablet    losartan (COZAAR) 50 MG tablet Take 1 tablet (50 mg total) by mouth every morning.   metFORMIN (GLUCOPHAGE-XR) 500 MG 24 hr tablet Take 2 tablets (1,000 mg total) by mouth daily.   metoprolol succinate (TOPROL-XL) 50 MG 24 hr tablet TAKE 1 TABLET EVERY EVENING, WITH OR IMMEDIATELY FOLLOWING A MEAL.   ONE TOUCH ULTRA TEST test strip Daily.   rosuvastatin (CRESTOR) 10 MG tablet Take 10 mg by mouth 1 day or 1 dose.    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   Exercises 3 to 4 days a week    OBJCTIVE -PE, EKG, labs   Wt Readings from Last 3 Encounters:  08/22/22 198 lb (89.8 kg)  08/20/22 199 lb (90.3 kg)  03/12/22 205 lb (93 kg)    Physical Exam: BP 120/73   Pulse 98   Ht 5\' 3"  (1.6 m)   Wt 198 lb (89.8 kg)   SpO2 97%   BMI 35.07 kg/m  Physical Exam Vitals reviewed.  Constitutional:      General: She is not in acute distress.     Appearance: Normal appearance. She is obese. She is not toxic-appearing.  HENT:     Head: Normocephalic and atraumatic.  Neck:     Vascular: No carotid bruit or JVD.  Cardiovascular:     Rate and Rhythm: Normal rate and regular rhythm. No extrasystoles are present.    Chest Wall: PMI is not displaced.     Pulses: Intact distal pulses. Decreased pulses (Difficult to palpate pedal pulses but feet are warm).     Heart sounds: S1 normal and S2 normal. Heart sounds are distant. No opening snap. Murmur (1-2/6 SEM RUSB) heard.     No friction rub. No gallop.     Comments: Borderline tachycardic Pulmonary:     Effort: Pulmonary effort is normal. No respiratory distress.     Breath sounds: Normal breath sounds. No wheezing or rales.  Chest:     Chest wall: No tenderness.  Musculoskeletal:        General: No swelling. Normal range of motion.     Cervical back: Normal range of motion and neck supple.  Skin:    General: Skin is warm and dry.  Neurological:     General: No focal deficit present.     Mental Status: She is alert and oriented to person, place, and time. Mental status is at baseline.     Cranial Nerves: No cranial nerve deficit.     Gait: Gait abnormal.  Psychiatric:        Mood and Affect: Mood normal.        Behavior: Behavior normal.        Thought Content: Thought content normal.        Judgment: Judgment normal.     Adult ECG Report  Rate: 98 ;  Rhythm: normal sinus rhythm and normal axis, intervals and durations. ;   Narrative Interpretation: Normal  Recent Labs:   12/06/2021: TC 129, TG 156.  HDL 46, LDL 56.  A1c 5.7.  Hgb 12.2, Cr 0.83, K+ 5.1, TSH 3.12. Lab Results  Component Value Date   CHOL 161 12/18/2020   HDL 37 ([name]) 12/18/2020   LDLCALC 89 12/18/2020   TRIG 206 (H) 12/18/2020   CHOLHDL 4.4 12/18/2020   Lab Results  Component Value Date   CREATININE 1.05 (H) 12/18/2020   BUN 19 12/18/2020   NA 140 12/18/2020   K 5.3 (H) 12/18/2020   CL 102 12/18/2020    CO2 23 12/18/2020      Latest Ref Rng & Units 08/02/2015   10:35 AM 08/03/2014    7:55 AM 07/07/2013    8:40 AM  CBC  WBC 3.9 - 10.3 10e3/uL 8.8  7.5  8.7   Hemoglobin 11.6 - 15.9 g/dL 96.0  45.4  09.8   Hematocrit 34.8 - 46.6 % 39.2  39.5  38.5   Platelets 145 - 400 10e3/uL 235  244  234     Lab Results  Component Value Date   HGBA1C 5.7 (A) 08/20/2022   Lab Results  Component Value Date   TSH 3.12 08/07/2020    ================================================== I spent a total of 26 minutes with the patient spent in direct patient consultation.  => We reviewed the results of her echo and discussed her symptoms.  She has become more deconditioned because of her husband's health getting worse.  Good portion of the visit was actually discussing his health and how that is affecting her.  He has been diagnosed with lung cancer being treated with radiation and chemotherapy. Additional time spent with chart review  / charting (studies, outside notes, etc): 16 min Total Time: 42 min  Current medicines are reviewed at length with the patient today.  (+/- concerns) none  Notice: This dictation was prepared with Dragon dictation along with smart phrase technology. Any transcriptional errors that result from this process are unintentional and may not be corrected upon review.  Studies Ordered:   Orders Placed This Encounter  Procedures   EKG 12-Lead   No orders of the defined types were placed in this encounter.   Patient Instructions / Medication Changes & Studies & Tests Ordered   Patient Instructions  Medication Instructions:   No changes *If you need a refill on your cardiac medications before your next appointment, please call your pharmacy*   Lab Work: Not needed    Testing/Procedures:  Not needed  Follow-Up: At [facility], you and your health needs are our priority.  As part of our continuing mission to provide you with exceptional heart care, we have  created designated Provider Care Teams.  These Care Teams include your primary Cardiologist (physician) and Advanced Practice Providers (APPs -  Physician Assistants and Nurse Practitioners) who all work together to provide you with the care you need, when you need it.     Your next appointment:   12 month(s)  The format for your next appointment:   In Person  Provider:   Onalee Hua  Herbie Baltimore, MD      Marykay Lex, MD, MS [name], M.D., M.S. Interventional Cardiologist  [facility]  Pager # [phone] Phone # [phone] [address] [place], [place] [address]   Thank you for choosing  [facility]!!

## 2022-08-22 NOTE — Patient Instructions (Signed)

## 2022-08-31 ENCOUNTER — Encounter: Payer: Self-pay | Admitting: Cardiology

## 2022-08-31 NOTE — Assessment & Plan Note (Signed)
Diffuse mild to moderate disease noted on coronary CTA with FFR positive findings in the distal LAD and distal circumflex but no obvious culprit lesion.  As such we are planning medical management.  She probably does have some microvascular disease but is doing relatively well overall.  She denies any angina, but does have exertional dyspnea which is probably is much related to deconditioning as it is anything else.  When she walks a mile on a flat ground she denies any symptoms.  As such, continue medical management.  Plan: Aspirin 81 mg daily. Toprol 50 mg daily along with losartan 50 mg daily. Rosuvastatin 10 mg with pretty well-controlled lipids as of August 2023. Her blood pressure will tolerate addition of amlodipine/calcium channel blocker for antianginal effect, but we are using losartan for afterload reduction.

## 2022-08-31 NOTE — Assessment & Plan Note (Signed)
Notably improved lipids from August 2022 to August 2023 10 mg rosuvastatin (had been on 20 reduced to 10 for symptoms).  Pretty much at goal. She is on 1000 g twice daily metoprolol for diabetes with an A1c of 5.7.  Would consider the possibility of GLP-1 agonist, if A1c goes up further.

## 2022-08-31 NOTE — Assessment & Plan Note (Signed)
Her blood pressure is somewhat labile and has been as low as the 80s and that was the 140s.  Therefore we will discontinue her current medications.  A little leery of the labile nature.  Would not want to add a diuretic at this point.  If anything would probably allow for some mild hypertension episodes because of the hypotension episodes as well.

## 2022-08-31 NOTE — Assessment & Plan Note (Signed)
I think a lot of her exertional dyspnea is due to deconditioning and musculoskeletal pains, and now she is more deconditioned-not able to do symptomatic exercise because of having to be caregiver for her husband.  Driving back and forth to doctors visits and chemotherapy/XRT.  Would like to see if she could lose weight.  I think she has lost a little bit.  Continue to encourage more weight loss.

## 2022-08-31 NOTE — Assessment & Plan Note (Signed)
Heart rate is better with beta-blocker.  Only on 50 mg daily, but with her having some fatigue issues, I am reluctant to push it further.

## 2022-09-26 DIAGNOSIS — E1140 Type 2 diabetes mellitus with diabetic neuropathy, unspecified: Secondary | ICD-10-CM | POA: Diagnosis not present

## 2022-10-03 ENCOUNTER — Telehealth: Payer: Self-pay

## 2022-10-03 DIAGNOSIS — G63 Polyneuropathy in diseases classified elsewhere: Secondary | ICD-10-CM

## 2022-10-03 NOTE — Telephone Encounter (Signed)
Left a detailed message,   J.Dayven Linsley,RMA  

## 2022-10-03 NOTE — Telephone Encounter (Signed)
Pt called and states that her Neuropathy is getting worse in her legs and arms. She I wanting to know can you treat her or refer her out to someone? Please advise.

## 2022-10-16 ENCOUNTER — Other Ambulatory Visit: Payer: Self-pay | Admitting: Family Medicine

## 2022-10-16 DIAGNOSIS — I10 Essential (primary) hypertension: Secondary | ICD-10-CM | POA: Diagnosis not present

## 2022-10-16 DIAGNOSIS — Z9181 History of falling: Secondary | ICD-10-CM | POA: Diagnosis not present

## 2022-10-16 DIAGNOSIS — I700 Atherosclerosis of aorta: Secondary | ICD-10-CM | POA: Diagnosis not present

## 2022-10-16 DIAGNOSIS — Z79899 Other long term (current) drug therapy: Secondary | ICD-10-CM | POA: Diagnosis not present

## 2022-10-16 DIAGNOSIS — J439 Emphysema, unspecified: Secondary | ICD-10-CM | POA: Diagnosis not present

## 2022-10-16 DIAGNOSIS — Z87891 Personal history of nicotine dependence: Secondary | ICD-10-CM

## 2022-10-16 DIAGNOSIS — E1140 Type 2 diabetes mellitus with diabetic neuropathy, unspecified: Secondary | ICD-10-CM | POA: Diagnosis not present

## 2022-10-16 DIAGNOSIS — Z1331 Encounter for screening for depression: Secondary | ICD-10-CM | POA: Diagnosis not present

## 2022-10-16 DIAGNOSIS — I2510 Atherosclerotic heart disease of native coronary artery without angina pectoris: Secondary | ICD-10-CM | POA: Diagnosis not present

## 2022-10-16 DIAGNOSIS — N1831 Chronic kidney disease, stage 3a: Secondary | ICD-10-CM | POA: Diagnosis not present

## 2022-10-21 DIAGNOSIS — Z1231 Encounter for screening mammogram for malignant neoplasm of breast: Secondary | ICD-10-CM | POA: Diagnosis not present

## 2022-10-22 ENCOUNTER — Encounter: Payer: Self-pay | Admitting: Obstetrics & Gynecology

## 2022-10-31 ENCOUNTER — Encounter: Payer: Self-pay | Admitting: Family Medicine

## 2022-11-14 ENCOUNTER — Inpatient Hospital Stay: Admission: RE | Admit: 2022-11-14 | Payer: Medicare HMO | Source: Ambulatory Visit

## 2022-12-09 ENCOUNTER — Ambulatory Visit
Admission: RE | Admit: 2022-12-09 | Discharge: 2022-12-09 | Disposition: A | Discharge status: Home / Self Care | Payer: Medicare HMO | Source: Ambulatory Visit | Attending: Family Medicine | Admitting: Family Medicine

## 2022-12-09 DIAGNOSIS — Z87891 Personal history of nicotine dependence: Secondary | ICD-10-CM

## 2022-12-15 DIAGNOSIS — L509 Urticaria, unspecified: Secondary | ICD-10-CM | POA: Diagnosis not present

## 2022-12-15 DIAGNOSIS — Z139 Encounter for screening, unspecified: Secondary | ICD-10-CM | POA: Diagnosis not present

## 2023-01-12 ENCOUNTER — Ambulatory Visit: Payer: Medicare HMO | Admitting: Diagnostic Neuroimaging

## 2023-02-08 IMAGING — CT CT CHEST LUNG CANCER SCREENING LOW DOSE W/O CM
1 series · 14 of 33 positions shown, 18 images · non-contrast
Comparison: No priors.

CLINICAL DATA: 64-year-old female former smoker (quit in 5811) with
80 pack-year history of smoking. Lung cancer screening examination.

EXAM:
CT CHEST WITHOUT CONTRAST LOW-DOSE FOR LUNG CANCER SCREENING
TECHNIQUE: Multidetector CT imaging of the chest was performed following the
standard protocol without IV contrast.

[Series 2: ldct screen -soft · axial · 0.68mm/px · z∈[-270,-30]mm · 14 of 58 slices shown, 18 images]
[im 5/58  mediastinal]
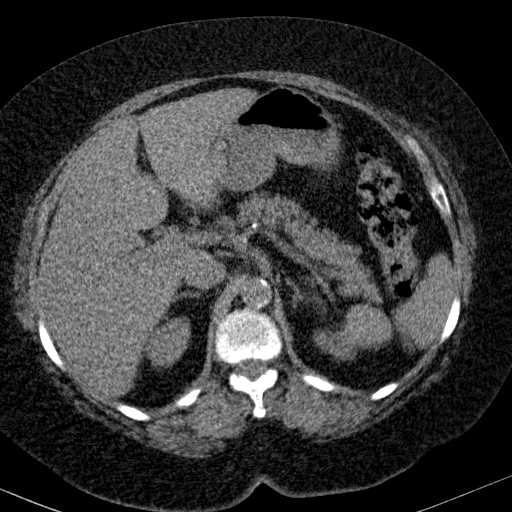
[im 5/58  lung]
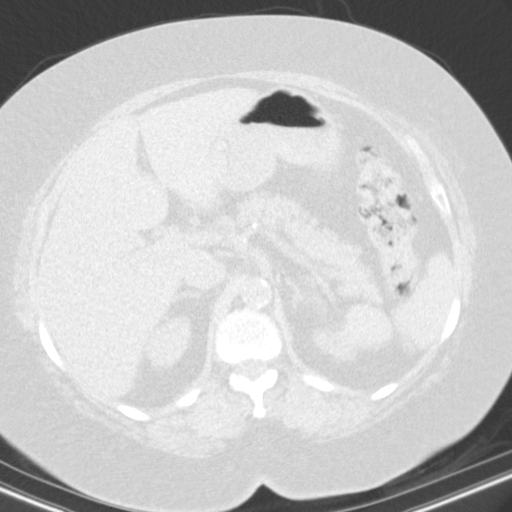
[im 9/58  lung]
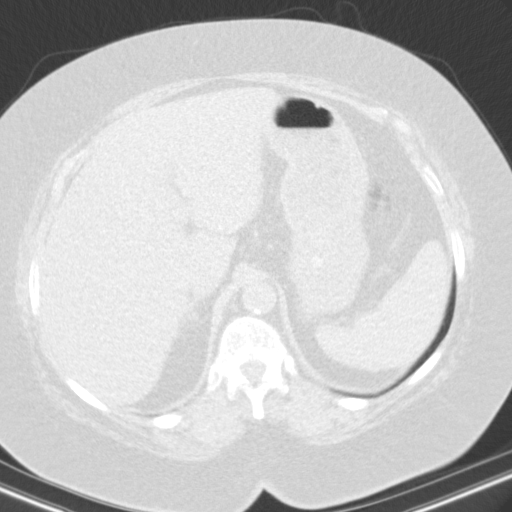
[im 12/58  lung]
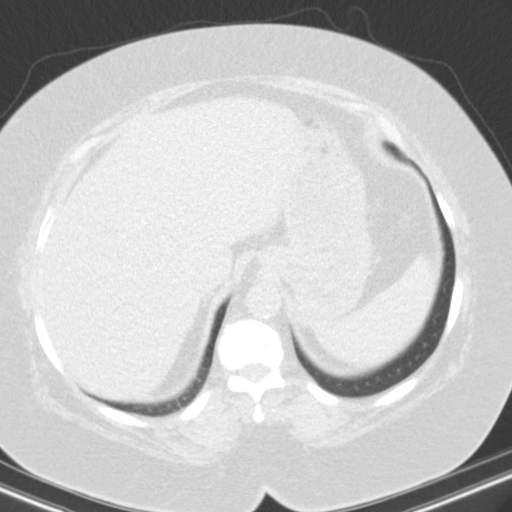
[im 15/58  lung]
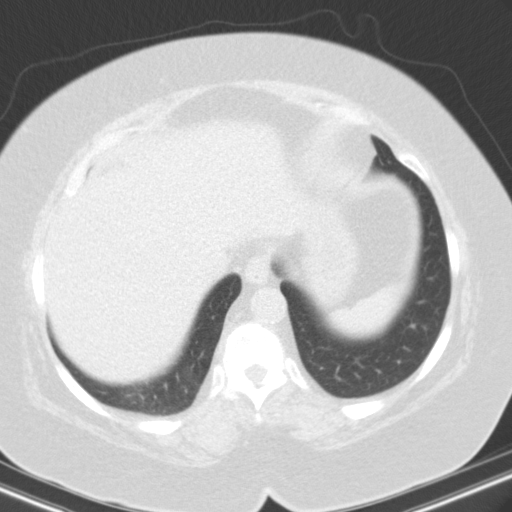
[im 20/58  mediastinal]
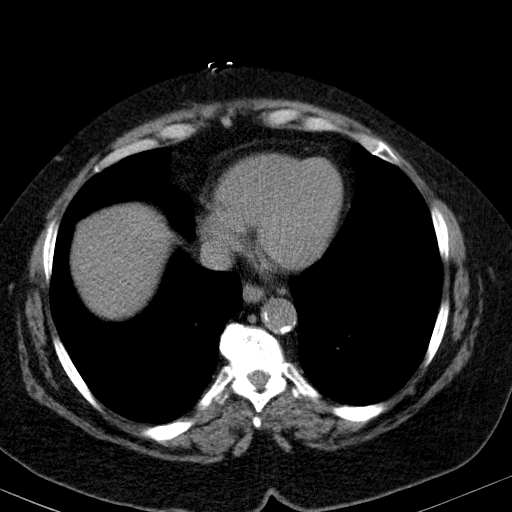
[im 20/58  lung]
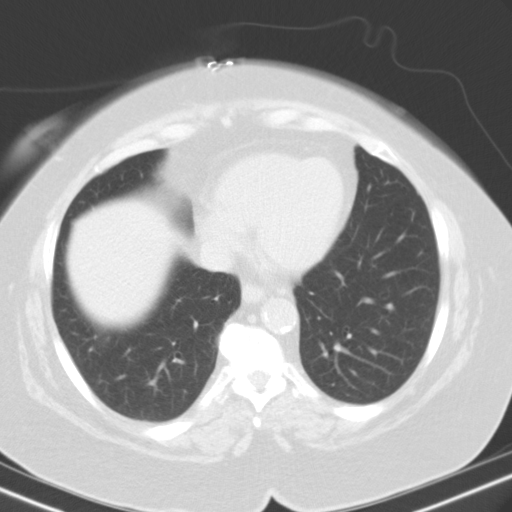
[im 24/58  lung]
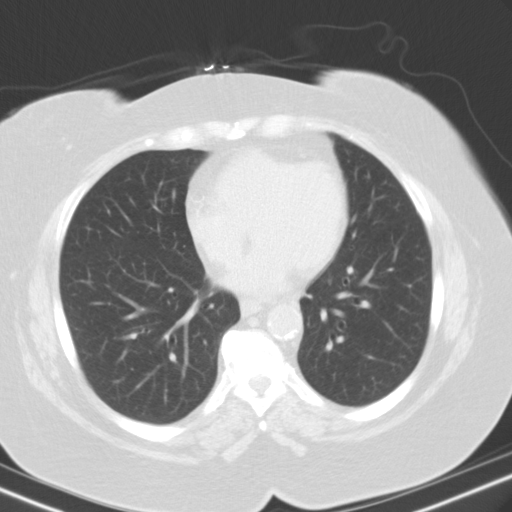
[im 28/58  lung]
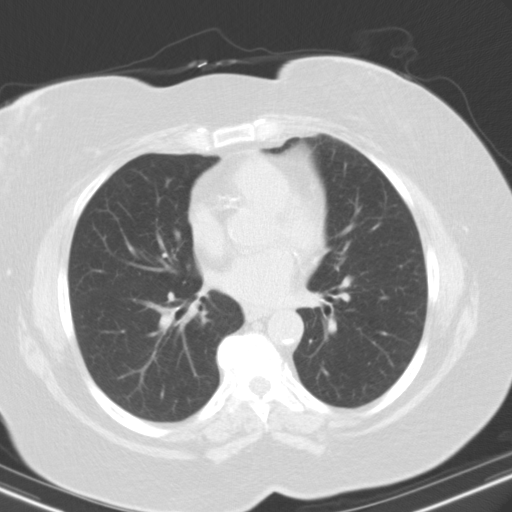
[im 31/58  lung]
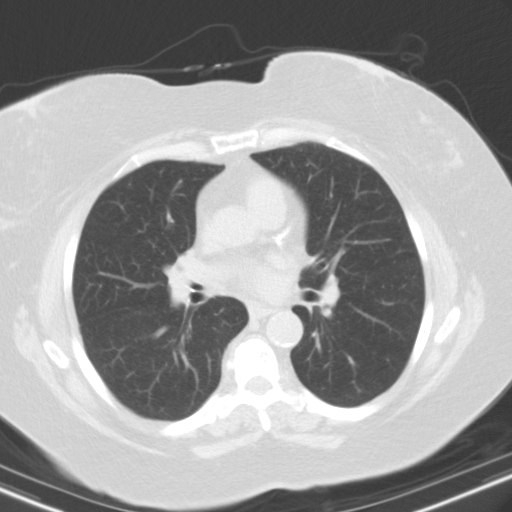
[im 34/58  mediastinal]
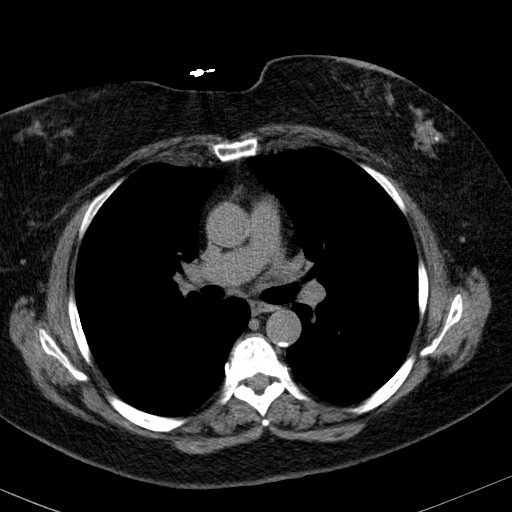
[im 34/58  lung]
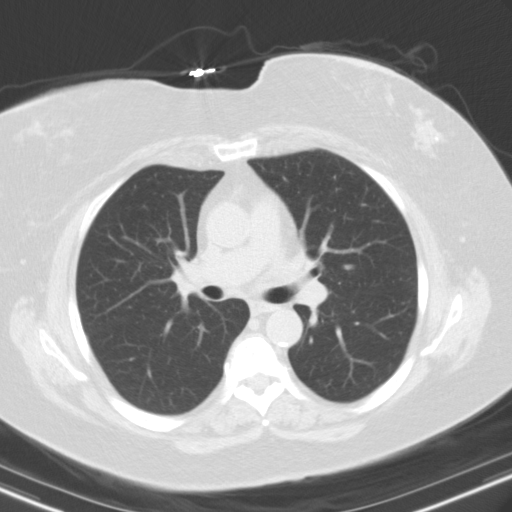
[im 39/58  lung]
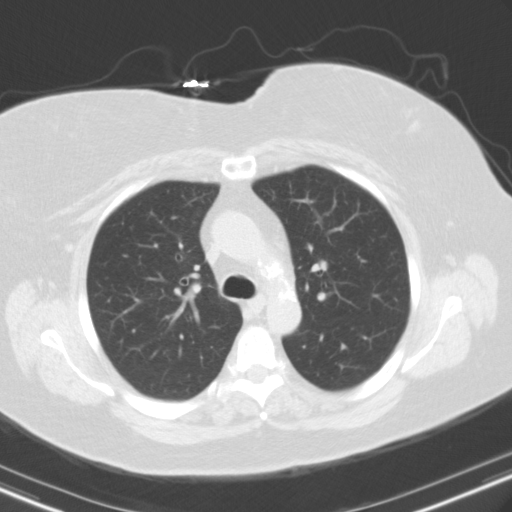
[im 43/58  lung]
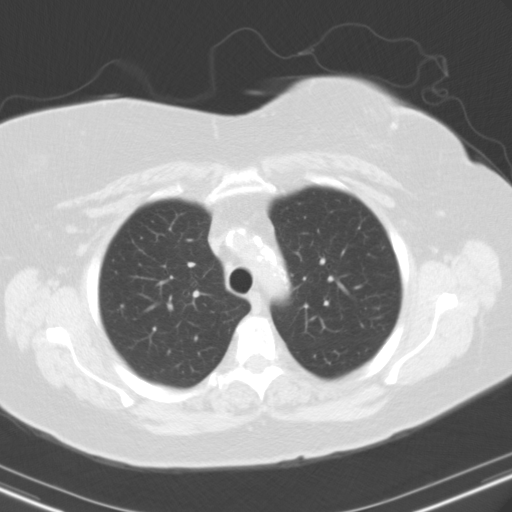
[im 46/58  lung]
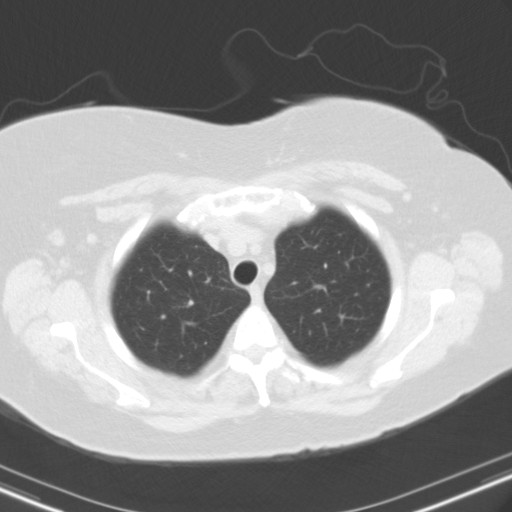
[im 49/58  mediastinal]
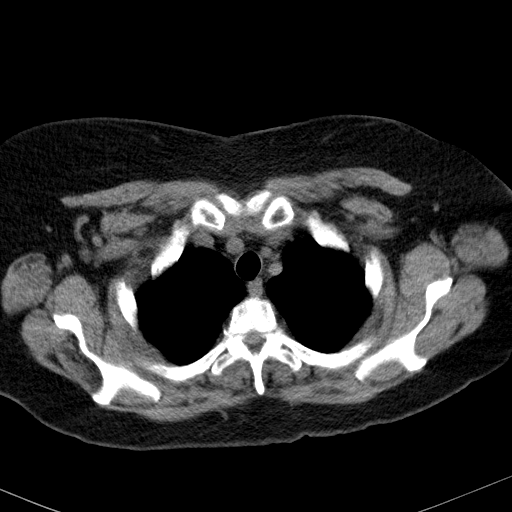
[im 49/58  lung]
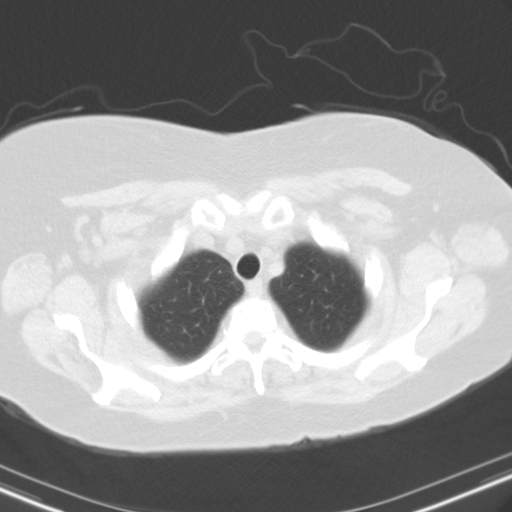
[im 53/58  lung]
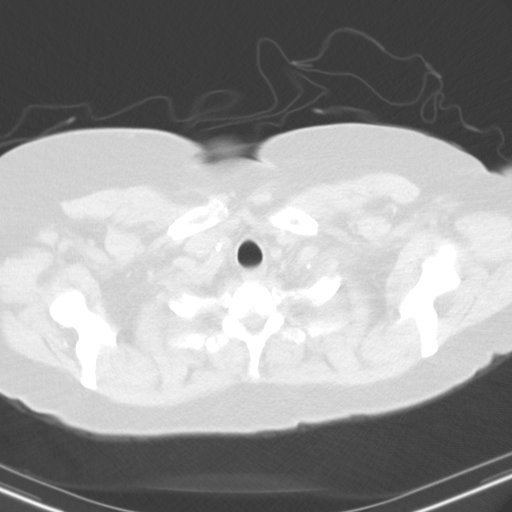

[14 of 33 positions shown; findings below may reference images not displayed]

FINDINGS: Cardiovascular: Heart size is normal. There is no significant
pericardial fluid, thickening or pericardial calcification. There is
aortic atherosclerosis, as well as atherosclerosis of the great
vessels of the mediastinum and the coronary arteries, including
calcified atherosclerotic plaque in the left main, left anterior
descending, left circumflex and right coronary arteries.

Mediastinum/Nodes: No pathologically enlarged mediastinal or hilar
lymph nodes. Please note that accurate exclusion of hilar adenopathy
is limited on noncontrast CT scans. Esophagus is unremarkable in
appearance. No axillary lymphadenopathy.

Lungs/Pleura: Tiny calcified and noncalcified pulmonary nodules are
noted in the lungs. The largest noncalcified pulmonary nodule is in
the right middle lobe abutting the minor fissure (axial image 157 of
series 3), with a volume derived mean diameter of 1.2 mm. No other
larger more suspicious appearing pulmonary nodules or masses are
noted. No acute consolidative airspace disease. No pleural
effusions. Mild diffuse bronchial wall thickening with very mild
centrilobular and paraseptal emphysema.

Upper Abdomen: Aortic atherosclerosis. Diffuse low attenuation
throughout the visualized portions of the hepatic parenchyma,
indicative of hepatic steatosis. Status post cholecystectomy.

Musculoskeletal: There are no aggressive appearing lytic or blastic
lesions noted in the visualized portions of the skeleton.
IMPRESSION: 1. Lung-RADS 2S, benign appearance or behavior. Continue annual
screening with low-dose chest CT without contrast in 12 months.
2. The "S" modifier above refers to potentially clinically
significant non lung cancer related findings. Specifically, there is
aortic atherosclerosis, in addition to left main and 3 vessel
coronary artery disease. Please note that although the presence of
coronary artery calcium documents the presence of coronary artery
disease, the severity of this disease and any potential stenosis
cannot be assessed on this non-gated CT examination. Assessment for
potential risk factor modification, dietary therapy or pharmacologic
therapy may be warranted, if clinically indicated.
3. Mild diffuse bronchial wall thickening with very mild
centrilobular and paraseptal emphysema; imaging findings suggestive
of underlying COPD.
4. Hepatic steatosis.

Aortic Atherosclerosis (KGJDC-06N.N) and Emphysema (KGJDC-R20.6).

## 2023-02-25 ENCOUNTER — Ambulatory Visit: Payer: Medicare HMO | Admitting: Internal Medicine

## 2023-03-13 ENCOUNTER — Ambulatory Visit: Payer: Medicare HMO | Admitting: Internal Medicine

## 2023-03-24 ENCOUNTER — Encounter: Payer: Self-pay | Admitting: Internal Medicine

## 2023-03-24 ENCOUNTER — Ambulatory Visit: Payer: Medicare HMO | Admitting: Internal Medicine

## 2023-03-24 VITALS — BP 132/84 | HR 98 | Ht 63.0 in | Wt 191.0 lb

## 2023-03-24 DIAGNOSIS — E1159 Type 2 diabetes mellitus with other circulatory complications: Secondary | ICD-10-CM | POA: Diagnosis not present

## 2023-03-24 DIAGNOSIS — Z7985 Long-term (current) use of injectable non-insulin antidiabetic drugs: Secondary | ICD-10-CM

## 2023-03-24 DIAGNOSIS — E1142 Type 2 diabetes mellitus with diabetic polyneuropathy: Secondary | ICD-10-CM

## 2023-03-24 LAB — POCT GLYCOSYLATED HEMOGLOBIN (HGB A1C): Hemoglobin A1C: 6.1 % — AB (ref 4.0–5.6)

## 2023-03-24 MED ORDER — SEMAGLUTIDE(0.25 OR 0.5MG/DOS) 2 MG/3ML ~~LOC~~ SOPN: 0.5000 mg | PEN_INJECTOR | SUBCUTANEOUS | 3 refills | Status: DC

## 2023-03-24 NOTE — Progress Notes (Unsigned)
Name: [name]  Age/ Sex: 67 y.o., female   MRN/ DOB: [id number], [birth year]     PCP: Ailene Ravel, MD   Reason for Endocrinology Evaluation: Type 2 Diabetes Mellitus  Initial Endocrine Consultative Visit: 08/18/2014    PATIENT IDENTIFIER: [name] is a 67 y.o. female with a past medical history of DM, dyslipidemia, HTN, and CAD. The patient has followed with Endocrinology clinic since 2016 for consultative assistance with management of her diabetes.  DIABETIC HISTORY:  Ms. Voeller was diagnosed with DM 2006, patient has been on metformin since diagnosis, no prior use of insulin. Her hemoglobin A1c has ranged from 5.9% in 2023, peaking at 6.3% in 2016.  Complications: PPN, PAD, and CAD Therapy: metformin Other: she has never been on insulin; metformin dosage is limited by lightheadedness.     The patient was followed by Dr. Everardo All from 2016 until 08/2021  SUBJECTIVE:   During the last visit (08/20/2022): A1c 5.7%  Today (03/24/2023): [name] is here for a follow up on diabetes management.   She checks her blood sugars 1-2 times daily. The patient has not had hypoglycemic episodes since the last clinic visit.   Patient follows with cardiology for CAD, hyperlipidemia, and HTN recommended GLP-1 agonist if her A1c increases Denies nausea, vomiting  Denies constipation or diarrhea   HOME DIABETES REGIMEN:  Metformin 500 mg XR, 2 tabs daily      Statin: Yes ACE-I/ARB: Yes   METER DOWNLOAD SUMMARY:  30 day average 104 mg/dL   40-981 mg/dL     DIABETIC COMPLICATIONS: Microvascular complications:  neuropathy Denies: CKD, retinopathy Last Eye Exam: Completed 01/2022  Macrovascular complications:  CAD Denies:  CVA, PVD   HISTORY:  Past Medical History:  Past Medical History:  Diagnosis Date   Agatston coronary artery calcium score greater than 400 07/11/2020   CAC Score 625.  LAD 204, LCx 227, RCA 195 (96th %-ile): Diffuse 25 to 49% lesions in  LAD, D1, LCx, OM1 and mid RCA.  CT FFR negative.   Anesthesia complication    Hard to wake up past knee surgery in ? 2004!   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  A1c 6.2 (February 2022)   GERD (gastroesophageal reflux disease)    History of breast cancer 2011   s/p L lobectomy in 2012 as well as radiation with letrozole x5 years.  (Dr. Darnelle Catalan)   Hyperlipidemia    Hypertension    under control with med., has been on med. x 6-7 yr.   Lesion of right ulnar nerve 08/2012   S/P radiation therapy /last radiation treatment 07/2010 07/04/10 - 08/11/10   5040 cGy/28 Treatments to Left Breast   Wears dentures    upper   Wears partial dentures    lower   Past Surgical History:  Past Surgical History:  Procedure Laterality Date   CARPAL TUNNEL RELEASE Right 09/21/2012   Procedure: CARPAL TUNNEL RELEASE;  Surgeon: Wyn Forster., MD;  Location: [facility];  Service: Orthopedics;  Laterality: Right;   CARPAL TUNNEL RELEASE Left 11/30/2012   Procedure: CARPAL TUNNEL RELEASE;  Surgeon: Wyn Forster., MD;  Location: [facility];  Service: Orthopedics;  Laterality: Left;   CATARACT EXTRACTION Bilateral 2017  Bil/ right eye on 08/27/2015 and left eye on 09/17/2015 Dr Darel Hong   CHOLECYSTECTOMY  09/16/2005   COLONOSCOPY  2017   Coronary CT angiogram  01/13/2021   Coronary Ca++ Score 625 - 3 V CAD. CAD RADS 2. Nll LM.  LAD, D1, LCx &OM1 (@ OM takeoff) 25-49% prox & mid, & w/ 1-25% proxl RCA and 25- 49% mid RCA. CT FFR: RCA 0.98, LM 0.98.  LAD 0.89 proximal, 0.5 mid and 0.76 apical (possibly distal tapering and not 1 focal lesion).  D10.86.  LCx 0.91 proximal, 0.90 [place] supranasal distal with small caliber terminal portion 0.77.-No significant lesion   KNEE ARTHROSCOPY Left 08/11/2002   PARTIAL  MASTECTOMY WITH NEEDLE LOCALIZATION AND AXILLARY SENTINEL LYMPH NODE BX Left 05/21/2010   with re-exc. medial margin lumpectomy site   ROBOTIC ASSISTED LAPAROSCOPIC LYSIS OF ADHESION  09/17/2010   ROBOTIC ASSISTED TOTAL HYSTERECTOMY WITH BILATERAL SALPINGO OOPHERECTOMY  09/17/2010   SHOULDER OPEN ROTATOR CUFF REPAIR Right 03/29/2013   Procedure: ROTATOR CUFF REPAIR SHOULDER OPEN WITH SUBACROMIAL DECOMPRESSION AND DISTAL CLAVICLE RESECTION;  Surgeon: Wyn Forster., MD;  Location: [facility];  Service: Orthopedics;  Laterality: Right;   ULNAR NERVE TRANSPOSITION Right 09/21/2012   Procedure: RIGHT IN SITU DECOMPRESSION ULNAR NERVE;  Surgeon: Wyn Forster., MD;  Location: [facility];  Service: Orthopedics;  Laterality: Right;   Social History:  reports that she quit smoking about 12 years ago. Her smoking use included cigarettes. She has never used smokeless tobacco. She reports that she does not drink alcohol and does not use drugs. Family History:  Family History  Problem Relation Age of Onset   Heart attack Mother 74   Diabetes Mellitus II Mother        Insulin-dependent   Coronary artery disease Mother 20   Hyperlipidemia Mother    Hypertension Mother    Diabetes Father    Coronary artery disease Father 14   Heart attack Father 47   Hyperlipidemia Father    Hypertension Father    Lung cancer Sister    Diabetes Mellitus II Sister    Colon cancer Sister 14   Diabetes Mellitus II Sister    Heart attack Sister    Coronary artery disease Sister    Diabetic kidney disease Sister        ESRD   Colon polyps Sister    Skin cancer Sister    Colon polyps Brother    Esophageal cancer Neg Hx    Stomach cancer Neg Hx    Rectal cancer Neg Hx      HOME MEDICATIONS: Allergies as of 03/24/2023       Reactions   Adhesive [tape] Other (See Comments)   BLISTERS   Hydrocodone Nausea And Vomiting   Diltiazem Other (See Comments)   constipation         Medication List        Accurate as of March 24, 2023  8:00 AM. If you have any questions, ask your nurse or doctor.          amitriptyline 100 MG tablet Commonly known as: ELAVIL Take 300 mg by mouth at bedtime.   aspirin EC 81 MG tablet Take 81 mg by mouth daily. Swallow whole.   Comirnaty syringe Generic drug: COVID-19 mRNA vaccine (Pfizer)   famotidine 40 MG tablet Commonly known as: PEPCID   Fluad 0.5 ML injection Generic drug: influenza vaccine adjuvanted   gabapentin 300 MG capsule Commonly known as:  NEURONTIN Take by mouth.   losartan 100 MG tablet Commonly known as: COZAAR Take 50 mg by mouth daily. What changed: Another medication with the same name was removed. Continue taking this medication, and follow the directions you see here. Changed by: Johnney Ou Waneta Fitting   metFORMIN 500 MG 24 hr tablet Commonly known as: GLUCOPHAGE-XR Take 2 tablets (1,000 mg total) by mouth daily.   metoprolol succinate 50 MG 24 hr tablet Commonly known as: TOPROL-XL TAKE 1 TABLET EVERY EVENING, WITH OR IMMEDIATELY FOLLOWING A MEAL.   ONE TOUCH ULTRA TEST test strip Generic drug: glucose blood Daily.   rosuvastatin 10 MG tablet Commonly known as: CRESTOR Take 10 mg by mouth 1 day or 1 dose.         OBJECTIVE:   Vital Signs: BP 132/84 (BP Location: Left Arm, Patient Position: Sitting, Cuff Size: Large)   Pulse 98   Ht 5\' 3"  (1.6 m)   Wt 191 lb (86.6 kg)   SpO2 99%   BMI 33.83 kg/m   Wt Readings from Last 3 Encounters:  03/24/23 191 lb (86.6 kg)  08/22/22 198 lb (89.8 kg)  08/20/22 199 lb (90.3 kg)     Exam: General: Pt appears well and is in NAD  Neck: General: Supple without adenopathy. Thyroid: Thyroid size normal.   Lungs: Clear with good BS bilat   Heart: RRR   Extremities: Trace  pretibial edema.   Neuro: MS is good with appropriate affect, pt is alert and Ox3    DM foot exam: 03/24/2023  The skin of the feet is intact without  sores or ulcerations. The pedal pulses are 2+ on right and 2+ on left. The sensation is intact to a screening 5.07, 10 gram monofilament bilaterally     DATA REVIEWED:  Lab Results  Component Value Date   HGBA1C 5.7 (A) 08/20/2022   HGBA1C 6.1 (A) 09/02/2021   HGBA1C 5.9 (A) 06/10/2021   Lab Results  Component Value Date   MICROALBUR <0.7 04/13/2015   LDLCALC 89 12/18/2020   CREATININE 1.05 (H) 12/18/2020   Lab Results  Component Value Date   MICRALBCREAT 3.6 04/13/2015     Lab Results  Component Value Date   CHOL 161 12/18/2020   HDL 37 (L) 12/18/2020   LDLCALC 89 12/18/2020   TRIG 206 (H) 12/18/2020   CHOLHDL 4.4 12/18/2020         ASSESSMENT / PLAN / RECOMMENDATIONS:   1) Type 2 Diabetes Mellitus, Optimally controlled, With neuropathic and macrovascular complications - Most recent A1c of 6.1 %. Goal A1c < 7.0 %.    - A1c at goal -We discussed switching metformin to GLP-1 agonist, patient in agreement of this, caution against GI side effects -She was provided with #1 sample pen of Ozempic   MEDICATIONS: Stop  metformin 500 mg 2 tabs daily  Start Ozempic 0.25 mg weekly for 6 weeks, then increase to 5 mg weekly  EDUCATION / INSTRUCTIONS: BG monitoring instructions: Patient is instructed to check her blood sugars 2-3 times a week. Call [facility] if: BG persistently < 70  I reviewed the Rule of 15 for the treatment of hypoglycemia in detail with the patient. Literature supplied.    2) Diabetic complications:  Eye: Does not have known diabetic retinopathy.  Neuro/ Feet: Does  have known diabetic peripheral neuropathy .  Renal: Patient does not have known baseline CKD. She   is  on an ACEI/ARB at present.     F/U  in 4 months     Signed electronically by: Lyndle Herrlich, MD  [facility] Endocrinology  [facility] [address] [place], [place] [address] Phone: [phone] FAX:  [phone]   CC: Ailene Ravel, MD [address] [place] [address] Phone: [phone]  Fax: [phone]  Return to Endocrinology clinic as below: Future Appointments  Date Time Provider Department Center  03/24/2023  8:10 AM Makailah Slavick, Konrad Dolores, MD [facility] None  05/01/2023 11:30 AM Rosalyn Gess, MD [facility] None

## 2023-03-24 NOTE — Patient Instructions (Signed)
Stop Metformin  Start Ozempic 0.25 mg once weekly for 6 weeks, than increase to 0.5 mg weekly

## 2023-03-25 ENCOUNTER — Encounter: Payer: Self-pay | Admitting: Internal Medicine

## 2023-03-25 LAB — MICROALBUMIN / CREATININE URINE RATIO
Creatinine, Urine: 30 mg/dL (ref 20–275)
Microalb Creat Ratio: 20 mg/g{creat} (ref <30)
Microalb, Ur: 0.6 mg/dL

## 2023-04-06 ENCOUNTER — Other Ambulatory Visit: Payer: Self-pay

## 2023-04-07 ENCOUNTER — Other Ambulatory Visit: Payer: Self-pay

## 2023-04-07 MED ORDER — BD SWAB SINGLE USE REGULAR PADS: MEDICATED_PAD | 5 refills | Status: DC

## 2023-04-28 DIAGNOSIS — I2510 Atherosclerotic heart disease of native coronary artery without angina pectoris: Secondary | ICD-10-CM | POA: Diagnosis not present

## 2023-04-28 DIAGNOSIS — I10 Essential (primary) hypertension: Secondary | ICD-10-CM | POA: Diagnosis not present

## 2023-04-28 DIAGNOSIS — J439 Emphysema, unspecified: Secondary | ICD-10-CM | POA: Diagnosis not present

## 2023-04-28 DIAGNOSIS — N1831 Chronic kidney disease, stage 3a: Secondary | ICD-10-CM | POA: Diagnosis not present

## 2023-04-28 DIAGNOSIS — E1140 Type 2 diabetes mellitus with diabetic neuropathy, unspecified: Secondary | ICD-10-CM | POA: Diagnosis not present

## 2023-04-28 DIAGNOSIS — I700 Atherosclerosis of aorta: Secondary | ICD-10-CM | POA: Diagnosis not present

## 2023-05-01 ENCOUNTER — Encounter: Payer: Medicare HMO | Admitting: Obstetrics and Gynecology

## 2023-05-04 ENCOUNTER — Other Ambulatory Visit: Payer: Self-pay | Admitting: Cardiology

## 2023-05-25 DIAGNOSIS — Z9181 History of falling: Secondary | ICD-10-CM | POA: Diagnosis not present

## 2023-05-25 DIAGNOSIS — Z139 Encounter for screening, unspecified: Secondary | ICD-10-CM | POA: Diagnosis not present

## 2023-05-25 DIAGNOSIS — Z780 Asymptomatic menopausal state: Secondary | ICD-10-CM | POA: Diagnosis not present

## 2023-05-25 DIAGNOSIS — Z0000 Encounter for general adult medical examination without abnormal findings: Secondary | ICD-10-CM | POA: Diagnosis not present

## 2023-05-25 DIAGNOSIS — Z1331 Encounter for screening for depression: Secondary | ICD-10-CM | POA: Diagnosis not present

## 2023-06-08 ENCOUNTER — Telehealth: Payer: Self-pay | Admitting: Cardiology

## 2023-06-08 NOTE — Telephone Encounter (Signed)
 Patient said that her insurance [facility]) needs a verification of her cardiovascular disease.   Fax # provided in message below.   Will routed to MD/RN

## 2023-06-08 NOTE — Telephone Encounter (Signed)
 Patient calling to say her [employer] insurance needs something a letter that she has cardiovascular disease. Can call [phone] and fax it [phone]. Pleasen advise

## 2023-06-13 NOTE — Telephone Encounter (Signed)
In order to not make this any more work to necessary, I would recommend attaching out a copy of her Coronary CTA, and my last clinic note.  Bryan Lemma, MD

## 2023-06-15 NOTE — Telephone Encounter (Signed)
Faxed MD note and cor CTA to [employer] at number listed

## 2023-07-10 DIAGNOSIS — M25561 Pain in right knee: Secondary | ICD-10-CM | POA: Diagnosis not present

## 2023-07-20 NOTE — Progress Notes (Unsigned)
 Name: [name]  Age/ Sex: 68 y.o., female   MRN/ DOB: [id number], [birth year]     PCP: Crystal Ravel, MD   Reason for Endocrinology Evaluation: Type 2 Diabetes Mellitus  Initial Endocrine Consultative Visit: 08/18/2014    PATIENT IDENTIFIER: [name] is a 68 y.o. female with a past medical history of DM, dyslipidemia, HTN, and CAD. The patient has followed with Endocrinology clinic since 2016 for consultative assistance with management of her diabetes.  DIABETIC HISTORY:  [name] was diagnosed with DM 2006, patient has been on metformin since diagnosis, no prior use of insulin. Her hemoglobin A1c has ranged from 5.9% in 2023, peaking at 6.3% in 2016.  Complications: PPN, PAD, and CAD Therapy: metformin Other: she has never been on insulin; metformin dosage is limited by lightheadedness.     The patient was followed by [name] from 2016 until 08/2021  We switch metformin to Ozempic 02/2023 with an A1c of 6.1%, due to cardiology recommendation  SUBJECTIVE:   During the last visit (03/24/2023): A1c 6.1%  Today (07/21/2023): [name] is here for a follow up on diabetes management.   She checks her blood sugars 1 times daily. The patient has not had hypoglycemic episodes since the last clinic visit.   Patient follows with cardiology for CAD, hyperlipidemia, and HTN recommended GLP-1 agonist if her A1c increases Denies nausea, vomiting  Denies constipation or diarrhea   HOME DIABETES REGIMEN:  Ozempic 0.5 mg weekly- takes 0.25 mg weekly    Statin: Yes ACE-I/ARB: Yes   METER DOWNLOAD SUMMARY:   84-125 mg/dL     DIABETIC COMPLICATIONS: Microvascular complications:  neuropathy Denies: CKD, retinopathy Last Eye Exam: Completed 01/2022  Macrovascular complications:  CAD Denies:  CVA, PVD   HISTORY:  Past Medical History:  Past Medical History:  Diagnosis Date   Agatston coronary artery calcium score greater than 400 07/11/2020   CAC Score  625.  LAD 204, LCx 227, RCA 195 (96th %-ile): Diffuse 25 to 49% lesions in LAD, D1, LCx, OM1 and mid RCA.  CT FFR negative.   Anesthesia complication    Hard to wake up past knee surgery in ? 2004!   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  A1c 6.2 (February 2022)   GERD (gastroesophageal reflux disease)    History of breast cancer 2011   s/p L lobectomy in 2012 as well as radiation with letrozole x5 years.  (Dr. Darnelle Catalan)   Hyperlipidemia    Hypertension    under control with med., has been on med. x 6-7 yr.   Lesion of right ulnar nerve 08/2012   S/P radiation therapy /last radiation treatment 07/2010 07/04/10 - 08/11/10   5040 cGy/28 Treatments to Left Breast   Wears dentures    upper   Wears partial dentures    lower   Past Surgical History:  Past Surgical History:  Procedure Laterality Date   CARPAL TUNNEL RELEASE Right 09/21/2012   Procedure: CARPAL TUNNEL RELEASE;  Surgeon: Wyn Forster., MD;  Location: [facility];  Service: Orthopedics;  Laterality: Right;   CARPAL TUNNEL RELEASE Left 11/30/2012   Procedure: CARPAL TUNNEL RELEASE;  Surgeon: Wyn Forster., MD;  Location:  SUR[facility]ERY [facility];  Service: Orthopedics;  Laterality:  Left;   CATARACT EXTRACTION Bilateral 2017   Bil/ right eye on 08/27/2015 and left eye on 09/17/2015 Dr Darel Hong   CHOLECYSTECTOMY  09/16/2005   COLONOSCOPY  2017   Coronary CT angiogram  01/13/2021   Coronary Ca++ Score 625 - 3 V CAD. CAD RADS 2. Nll LM.  LAD, D1, LCx &OM1 (@ OM takeoff) 25-49% prox & mid, & w/ 1-25% proxl RCA and 25- 49% mid RCA. CT FFR: RCA 0.98, LM 0.98.  LAD 0.89 proximal, 0.5 mid and 0.76 apical (possibly distal tapering and not 1 focal lesion).  D10.86.  LCx 0.91 proximal, 0.90 [place] supranasal distal with small caliber terminal portion  0.77.-No significant lesion   KNEE ARTHROSCOPY Left 08/11/2002   PARTIAL MASTECTOMY WITH NEEDLE LOCALIZATION AND AXILLARY SENTINEL LYMPH NODE BX Left 05/21/2010   with re-exc. medial margin lumpectomy site   ROBOTIC ASSISTED LAPAROSCOPIC LYSIS OF ADHESION  09/17/2010   ROBOTIC ASSISTED TOTAL HYSTERECTOMY WITH BILATERAL SALPINGO OOPHERECTOMY  09/17/2010   SHOULDER OPEN ROTATOR CUFF REPAIR Right 03/29/2013   Procedure: ROTATOR CUFF REPAIR SHOULDER OPEN WITH SUBACROMIAL DECOMPRESSION AND DISTAL CLAVICLE RESECTION;  Surgeon: Wyn Forster., MD;  Location: [facility];  Service: Orthopedics;  Laterality: Right;   ULNAR NERVE TRANSPOSITION Right 09/21/2012   Procedure: RIGHT IN SITU DECOMPRESSION ULNAR NERVE;  Surgeon: Wyn Forster., MD;  Location: [facility];  Service: Orthopedics;  Laterality: Right;   Social History:  reports that she quit smoking about 13 years ago. Her smoking use included cigarettes. She has never used smokeless tobacco. She reports that she does not drink alcohol and does not use drugs. Family History:  Family History  Problem Relation Age of Onset   Heart attack Mother 58   Diabetes Mellitus II Mother        Insulin-dependent   Coronary artery disease Mother 58   Hyperlipidemia Mother    Hypertension Mother    Diabetes Father    Coronary artery disease Father 103   Heart attack Father 1   Hyperlipidemia Father    Hypertension Father    Lung cancer Sister    Diabetes Mellitus II Sister    Colon cancer Sister 15   Diabetes Mellitus II Sister    Heart attack Sister    Coronary artery disease Sister    Diabetic kidney disease Sister        ESRD   Colon polyps Sister    Skin cancer Sister    Colon polyps Brother    Esophageal cancer Neg Hx    Stomach cancer Neg Hx    Rectal cancer Neg Hx      HOME MEDICATIONS: Allergies as of 07/21/2023       Reactions   Adhesive [tape] Other (See Comments)   BLISTERS   Hydrocodone  Nausea And Vomiting   Diltiazem Other (See Comments)   constipation        Medication List        Accurate as of July 21, 2023  7:53 AM. If you have any questions, ask your nurse or doctor.          amitriptyline 100 MG tablet Commonly known as: ELAVIL Take 300 mg by mouth at bedtime.   aspirin EC 81 MG tablet Take 81 mg by mouth daily. Swallow whole.   B-D SINGLE USE SWABS REGULAR Pads Use as directed   Comirnaty syringe Generic drug: COVID-19 mRNA vaccine (Pfizer)   famotidine 40 MG tablet Commonly known as: PEPCID  Fluad 0.5 ML injection Generic drug: influenza vaccine adjuvanted   gabapentin 300 MG capsule Commonly known as: NEURONTIN Take by mouth.   losartan 100 MG tablet Commonly known as: COZAAR Take 50 mg by mouth daily.   metoprolol succinate 50 MG 24 hr tablet Commonly known as: TOPROL-XL TAKE 1 TABLET EVERY EVENING, WITH OR IMMEDIATELY FOLLOWING A MEAL   ONE TOUCH ULTRA TEST test strip Generic drug: glucose blood Daily.   Prevnar 20 0.5 ML injection Generic drug: pneumococcal 20-valent conjugate vaccine   rosuvastatin 10 MG tablet Commonly known as: CRESTOR Take 10 mg by mouth 1 day or 1 dose.   Semaglutide(0.25 or 0.5MG /DOS) 2 MG/3ML Sopn Inject 0.5 mg into the skin once a week.         OBJECTIVE:   Vital Signs: BP 124/82 (BP Location: Left Arm, Patient Position: Sitting, Cuff Size: Small)   Pulse (!) 103   Ht 5\' 3"  (1.6 m)   Wt 188 lb (85.3 kg)   SpO2 97%   BMI 33.30 kg/m   Wt Readings from Last 3 Encounters:  07/21/23 188 lb (85.3 kg)  03/24/23 191 lb (86.6 kg)  08/22/22 198 lb (89.8 kg)   Filed Weights   07/21/23 0745  Weight: 188 lb (85.3 kg)     Exam: General: Pt appears well and is in NAD  Lungs: Clear with good BS bilat   Heart: RRR   Extremities: No  pretibial edema.   Neuro: MS is good with appropriate affect, pt is alert and Ox3    DM foot exam: 03/24/2023  The skin of the feet is intact without  sores or ulcerations. The pedal pulses are 2+ on right and 2+ on left. The sensation is intact to a screening 5.07, 10 gram monofilament bilaterally     DATA REVIEWED:  Lab Results  Component Value Date   HGBA1C 6.0 (A) 07/21/2023   HGBA1C 6.1 (A) 03/24/2023   HGBA1C 5.7 (A) 08/20/2022    Latest Reference Range & Units 03/24/23 08:30  Microalb, Ur mg/dL 0.6  MICROALB/CREAT RATIO <30 mg/g creat 20  Creatinine, Urine 20 - 275 mg/dL 30     ASSESSMENT / PLAN / RECOMMENDATIONS:   1) Type 2 Diabetes Mellitus, Optimally controlled, With neuropathic and macrovascular complications - Most recent A1c of 6.0 %. Goal A1c < 7.0 %.    - A1c at goal -We opted to remain on current dose of Ozempic -Ozempic was switched from metformin, she had no adverse events to metformin but this was due to cardiovascular benefits  MEDICATIONS:  Continue Ozempic 0.25 mg weekly  EDUCATION / INSTRUCTIONS: BG monitoring instructions: Patient is instructed to check her blood sugars 2-3 times a week. Call [facility] if: BG persistently < 70  I reviewed the Rule of 15 for the treatment of hypoglycemia in detail with the patient. Literature supplied.    2) Diabetic complications:  Eye: Does not have known diabetic retinopathy.  Neuro/ Feet: Does  have known diabetic peripheral neuropathy .  Renal: Patient does not have known baseline CKD. She   is  on an ACEI/ARB at present.     F/U in 6 months     Signed electronically by: Lyndle Herrlich, MD  [facility] Endocrinology  [facility] [address] [place], [place] [address] Phone: [phone] FAX: [phone]   CC: Crystal Ravel, MD [address] [place] [address] Phone: [phone]  Fax: [phone]  Return to Endocrinology clinic  as below: Future Appointments  Date Time Provider Department Center  08/19/2023 10:20 AM Marykay Lex, MD [facility] None

## 2023-07-21 ENCOUNTER — Ambulatory Visit: Payer: Medicare HMO | Admitting: Internal Medicine

## 2023-07-21 ENCOUNTER — Encounter: Payer: Self-pay | Admitting: Internal Medicine

## 2023-07-21 VITALS — BP 124/82 | HR 103 | Ht 63.0 in | Wt 188.0 lb

## 2023-07-21 DIAGNOSIS — E1142 Type 2 diabetes mellitus with diabetic polyneuropathy: Secondary | ICD-10-CM

## 2023-07-21 DIAGNOSIS — Z7985 Long-term (current) use of injectable non-insulin antidiabetic drugs: Secondary | ICD-10-CM | POA: Diagnosis not present

## 2023-07-21 LAB — POCT GLYCOSYLATED HEMOGLOBIN (HGB A1C): Hemoglobin A1C: 6 % — AB (ref 4.0–5.6)

## 2023-07-21 MED ORDER — SEMAGLUTIDE(0.25 OR 0.5MG/DOS) 2 MG/3ML ~~LOC~~ SOPN: 0.5000 mg | PEN_INJECTOR | SUBCUTANEOUS | 3 refills | Status: DC

## 2023-07-21 NOTE — Patient Instructions (Signed)
Continue Ozempic 0.25mg weekly

## 2023-08-14 IMAGING — CT CT HEART MORP W/ CTA COR W/ SCORE W/ CA W/CM &/OR W/O CM
1 series · 1 of 1 positions shown, 2 images · non-contrast
Comparison: Lung cancer screening CT 07/12/2020 and the calcium
score CT of 10/11/2020.

Addendum:
CLINICAL DATA: Chest pain

EXAM:
Cardiac CTA
MEDICATIONS:
Sub lingual nitro. 4mg and lopressor 50mg and Ivabradine 10 mg
TECHNIQUE: The patient was scanned on a Siemens Force [REDACTED]ice scanner. Gantry
rotation speed was 250 msecs. Collimation was .6 mm. A 100 kV
prospective scan was triggered in the ascending thoracic aorta at
140 HU's Full mA was used between 35% and 75% of the R-R interval.
Average HR during the scan was 66 bpm. The 3D data set was
interpreted on a dedicated work station using MPR, MIP and VRT
modes. A total of 80 cc of contrast was used.

[Series 2230: proximal circumflex · 0.32mm/px · 1 of 1 slices shown, 2 images]
[im 1/1  vessel]
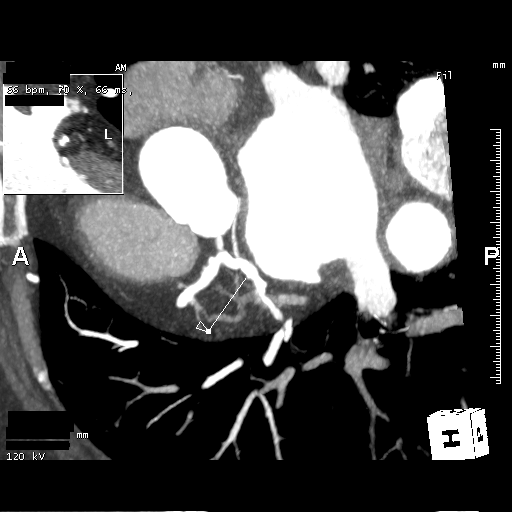
[im 1/1  lung]
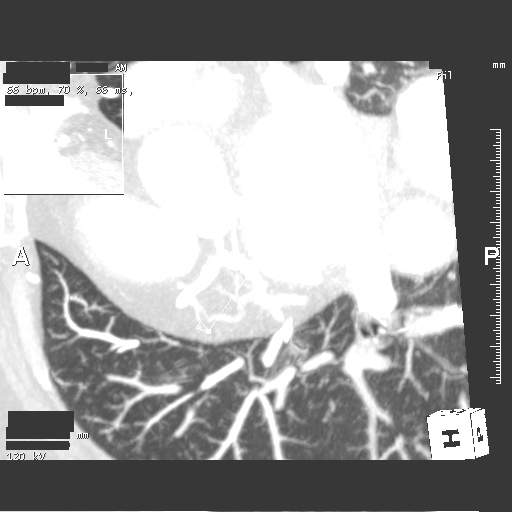

[1 of 1 positions shown; findings below may reference images not displayed]

FINDINGS: Non-cardiac: See separate report from [REDACTED]. No
significant findings on limited lung and soft tissue windows.

Calcium Score: 3 vessel calcium noted

Coronary Arteries: Right dominant with no anomalies

LM: Normal

LAD: 25-49% calcified plaque proximal and mid vessel

D1: 25-49% calcified plaque proximally

Circumflex: 25-49% calcified plaque proximally 25-49% calcified
plaque mid vessel at OM take off

OM1: 25-49% calcified plaque ostium/proximal

AV Groove: Small vessel not well seen

RCA: 1-25% calcified plaque proximally 25-49% calcified plaque
distally contiguous with ostial PDA plaque

PDA: 25-49% calcified plaque ostium and proximal vessel

PLA: Normal
IMPRESSION: 1. Calcium score 625 involving 3 vessels which is 96 th percentile
for age/sex

2. CAD RADS 2 CAD in 3 vessels see above Study sent for FFR CT given
calcification at branch points and moderate stenosis

3.  Normal aortic root 2.6 cm

Cinda Cordier

EXAM:
OVER-READ INTERPRETATION  CT CHEST

The following report is an over-read performed by radiologist Dr.
Ndaryiyumvire Happy Bitigwa [[email or web address]] on 01/14/2021. This over-read
does not include interpretation of cardiac or coronary anatomy or
pathology. The coronary CTA interpretation by the cardiologist is
attached.
FINDINGS: Vascular: Aortic atherosclerosis. No central pulmonary embolism, on
this non-dedicated study.

Mediastinum/Nodes: No imaged thoracic adenopathy.

Lungs/Pleura: No pleural fluid.  Clear imaged lungs.

Upper Abdomen: Moderate hepatic steatosis.

Musculoskeletal: No acute osseous abnormality.
IMPRESSION: 1.  No acute findings in the imaged extracardiac chest.
2.  Aortic Atherosclerosis ([id number]-[id number].L).
3. Hepatic steatosis.

*** End of Addendum ***
FINDINGS: Non-cardiac: See separate report from [REDACTED]. No
significant findings on limited lung and soft tissue windows.

Calcium Score: 3 vessel calcium noted

Coronary Arteries: Right dominant with no anomalies

LM: Normal

LAD: 25-49% calcified plaque proximal and mid vessel

D1: 25-49% calcified plaque proximally

Circumflex: 25-49% calcified plaque proximally 25-49% calcified
plaque mid vessel at OM take off

OM1: 25-49% calcified plaque ostium/proximal

AV Groove: Small vessel not well seen

RCA: 1-25% calcified plaque proximally 25-49% calcified plaque
distally contiguous with ostial PDA plaque

PDA: 25-49% calcified plaque ostium and proximal vessel

PLA: Normal
IMPRESSION: 1. Calcium score 625 involving 3 vessels which is 96 th percentile
for age/sex

2. CAD RADS 2 CAD in 3 vessels see above Study sent for FFR CT given
calcification at branch points and moderate stenosis

3.  Normal aortic root 2.6 cm

Cinda Cordier

## 2023-08-19 ENCOUNTER — Ambulatory Visit: Payer: Medicare HMO | Admitting: Cardiology

## 2023-09-02 ENCOUNTER — Other Ambulatory Visit: Payer: Self-pay | Admitting: Internal Medicine

## 2023-10-07 ENCOUNTER — Other Ambulatory Visit: Payer: Self-pay | Admitting: Cardiology

## 2023-10-07 ENCOUNTER — Other Ambulatory Visit: Payer: Self-pay | Admitting: Internal Medicine

## 2023-10-08 ENCOUNTER — Other Ambulatory Visit: Payer: Self-pay

## 2023-10-08 MED ORDER — METOPROLOL SUCCINATE ER 50 MG PO TB24: 50.0000 mg | ORAL_TABLET | Freq: Every evening | ORAL | 0 refills | Status: DC

## 2023-10-12 ENCOUNTER — Other Ambulatory Visit: Payer: Self-pay

## 2023-10-12 MED ORDER — DROPSAFE ALCOHOL PREP 70 % PADS
MEDICATED_PAD | 3 refills | Status: DC
Start: 2023-10-12 — End: 2023-10-13

## 2023-10-13 ENCOUNTER — Other Ambulatory Visit: Payer: Self-pay

## 2023-10-13 MED ORDER — DROPSAFE ALCOHOL PREP 70 % PADS
MEDICATED_PAD | 3 refills | Status: DC
Start: 2023-10-13 — End: 2024-03-11

## 2023-10-13 MED ORDER — OZEMPIC (0.25 OR 0.5 MG/DOSE) 2 MG/3ML ~~LOC~~ SOPN: PEN_INJECTOR | SUBCUTANEOUS | 5 refills | Status: DC

## 2023-10-19 ENCOUNTER — Telehealth: Payer: Self-pay | Admitting: Obstetrics and Gynecology

## 2023-10-19 DIAGNOSIS — M8580 Other specified disorders of bone density and structure, unspecified site: Secondary | ICD-10-CM

## 2023-10-19 NOTE — Telephone Encounter (Signed)
 Needs 2 year follow-up bone scan

## 2023-10-23 ENCOUNTER — Telehealth (HOSPITAL_BASED_OUTPATIENT_CLINIC_OR_DEPARTMENT_OTHER): Payer: Self-pay

## 2023-10-27 ENCOUNTER — Ambulatory Visit (INDEPENDENT_AMBULATORY_CARE_PROVIDER_SITE_OTHER): Admitting: Obstetrics and Gynecology

## 2023-10-27 ENCOUNTER — Encounter: Payer: Self-pay | Admitting: Obstetrics and Gynecology

## 2023-10-27 VITALS — Ht 60.75 in | Wt 184.0 lb

## 2023-10-27 DIAGNOSIS — M8580 Other specified disorders of bone density and structure, unspecified site: Secondary | ICD-10-CM

## 2023-10-27 DIAGNOSIS — Z01419 Encounter for gynecological examination (general) (routine) without abnormal findings: Secondary | ICD-10-CM | POA: Diagnosis not present

## 2023-10-27 DIAGNOSIS — Z853 Personal history of malignant neoplasm of breast: Secondary | ICD-10-CM

## 2023-10-27 DIAGNOSIS — N952 Postmenopausal atrophic vaginitis: Secondary | ICD-10-CM

## 2023-10-27 DIAGNOSIS — Z1331 Encounter for screening for depression: Secondary | ICD-10-CM | POA: Diagnosis not present

## 2023-10-27 DIAGNOSIS — Z1231 Encounter for screening mammogram for malignant neoplasm of breast: Secondary | ICD-10-CM

## 2023-10-27 NOTE — Progress Notes (Signed)
 68 y.o. y.o. female here for annual exam. No LMP recorded. Patient has had a hysterectomy.     G0 Married   RP:  Established patient presenting for annual gyn exam    HPI: S/P Total Hysterectomy for Fibroids.  Postmenopause, well on no HRT.  No pelvic pain.  Abstinent.  Urine/BMs normal.  H/O Breast Ca followed by Dr Layla. Breasts normal. MMG 10/21/22.  BMI 38.35. Gardening.  Health labs with Fam MD.  Sister with Colon Ca.  Colono 2017, 2022 diverticulosis, repeat Q5 years   Past medical history,surgical history, family history and social history were all reviewed and documented in the EPIC chart. DXA 2022: LFN osteopenia -1.2, repeat ordered and she is going to [facility] for this and the [facility]   Body mass index is 35.05 kg/m.     10/27/2023    7:53 AM 11/24/2014    8:07 AM  Depression screen PHQ 2/9  Decreased Interest 3 0  Down, Depressed, Hopeless 0 0  PHQ - 2 Score 3 0  Altered sleeping 0   Tired, decreased energy 0   Change in appetite 0   Feeling bad or failure about yourself  0   Trouble concentrating 0   Moving slowly or fidgety/restless 0   Suicidal thoughts 0   PHQ-9 Score 3   Difficult doing work/chores Not difficult at all     Height 5' 0.75 (1.543 m), weight 184 lb (83.5 kg).  No results found for: DIAGPAP, HPVHIGH, ADEQPAP  GYN HISTORY: No results found for: DIAGPAP, HPVHIGH, ADEQPAP  OB History  Gravida Para Term Preterm AB Living  0 0 0 0 0 0  SAB IAB Ectopic Multiple Live Births  0 0 0 0 0    Past Medical History:  Diagnosis Date   [place]at[place] coronary artery calcium score greater than 400 07/11/2020   CAC Score 625.  LAD 204, LCx 227, RCA 195 (96th %-ile): Diffuse 25 to 49% lesions in LAD, D1, LCx, OM1 and mid RCA.  CT FFR negative.   Anesthesia complication    Hard to wake up past knee surgery in ? 2004!   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  A1c 6.2 (February 2022)   GERD (gastroesophageal reflux disease)    History of breast cancer 2011   s/p L lobectomy in 2012 as well as radiation with letrozole  x5 years.  (Dr. Layla)   Hyperlipidemia    Hypertension    under control with med., has been on med. x 6-7 yr.   Lesion of right ulnar nerve 08/2012   S/P radiation therapy /last radiation treatment 07/2010 07/04/10 - 08/11/10   5040 cGy/28 Treatments to Left Breast   Wears dentures    upper   Wears partial dentures    lower    Past Surgical History:  Procedure Laterality Date   CARPAL TUNNEL RELEASE Right 09/21/2012   Procedure: CARPAL TUNNEL RELEASE;  Surgeon: Lamar LULLA Leonor Mickey., MD;  Location: [facility];  Service: Orthopedics;  Laterality: Right;   CARPAL TUNNEL RELEASE Left 11/30/2012   Procedure: CARPAL TUNNEL RELEASE;  Surgeon: Lamar LULLA Leonor Mickey., MD;  Location: [facility];  Service: Orthopedics;  Laterality: Left;   CATARACT EXTRACTION Bilateral 2017   Bil/ right eye  on 08/27/2015 and left eye on 09/17/2015 Dr Milan   CHOLECYSTECTOMY  09/16/2005   COLONOSCOPY  2017   Coronary CT angiogram  01/13/2021   Coronary Ca++ Score 625 - 3 V CAD. CAD RADS 2. Nll LM.  LAD, D1, LCx &OM1 (@ OM takeoff) 25-49% prox & mid, & w/ 1-25% proxl RCA and 25- 49% mid RCA. CT FFR: RCA 0.98, LM 0.98.  LAD 0.89 proximal, 0.5 mid and 0.76 apical (possibly distal tapering and not 1 focal lesion).  D10.86.  LCx 0.91 proximal, 0.90 [place] supranasal distal with small caliber terminal portion 0.77.-No significant lesion   KNEE ARTHROSCOPY Left 08/11/2002   PARTIAL MASTECTOMY WITH NEEDLE LOCALIZATION AND AXILLARY SENTINEL LYMPH NODE BX Left 05/21/2010   with re-exc. medial margin lumpectomy site   ROBOTIC ASSISTED LAPAROSCOPIC LYSIS OF ADHESION  09/17/2010   ROBOTIC ASSISTED TOTAL HYSTERECTOMY WITH BILATERAL SALPINGO OOPHERECTOMY  09/17/2010    SHOULDER OPEN ROTATOR CUFF REPAIR Right 03/29/2013   Procedure: ROTATOR CUFF REPAIR SHOULDER OPEN WITH SUBACROMIAL DECOMPRESSION AND DISTAL CLAVICLE RESECTION;  Surgeon: Lamar LULLA Leonor Mickey., MD;  Location: [facility];  Service: Orthopedics;  Laterality: Right;   ULNAR NERVE TRANSPOSITION Right 09/21/2012   Procedure: RIGHT IN SITU DECOMPRESSION ULNAR NERVE;  Surgeon: Lamar LULLA Leonor Mickey., MD;  Location: [facility];  Service: Orthopedics;  Laterality: Right;    Current Outpatient Medications on File Prior to Visit  Medication Sig Dispense Refill   Alcohol  Swabs (DROPSAFE ALCOHOL  PREP) 70 % PADS USE AS DIRECTED 300 each 3   amitriptyline (ELAVIL) 100 MG tablet Take 300 mg by mouth at bedtime.      aspirin EC 81 MG tablet Take 81 mg by mouth daily. Swallow whole.     COMIRNATY syringe      famotidine  (PEPCID ) 40 MG tablet      gabapentin  (NEURONTIN ) 300 MG capsule Take by mouth.     losartan  (COZAAR ) 100 MG tablet Take 50 mg by mouth daily.     metoprolol  succinate (TOPROL -XL) 50 MG 24 hr tablet Take 1 tablet (50 mg total) by mouth every evening. Take with or immediately following a meal. 90 tablet 0   ONE TOUCH ULTRA TEST test strip Daily.     rosuvastatin (CRESTOR) 10 MG tablet Take 10 mg by mouth 1 day or 1 dose.     Semaglutide ,0.25 or 0.5MG /DOS, (OZEMPIC , 0.25 OR 0.5 MG/DOSE,) 2 MG/3ML SOPN INJECT 0.5 MG SUBCUTANEOUSLY ONCE A WEEK *NEW PRESCRIPTION REQUEST* 3 mL 5   TRUEplus Lancets 28G MISC Apply topically daily.     No current facility-administered medications on file prior to visit.    Social History   Socioeconomic History   Marital status: Widowed    Spouse name: Not on file   Number of children: 0   Years of education: Not on file   Highest education level: Not on file  Occupational History   Not on file  Tobacco Use   Smoking status: Former    Current packs/day: 0.00    Types: Cigarettes    Quit date: 05/07/2010    Years since quitting:  13.4   Smokeless tobacco: Never  Vaping Use   Vaping status: Never Used  Substance and Sexual Activity   Alcohol  use: No   Drug use: No   Sexual activity: Not Currently    Partners: Male    Birth control/protection: Surgical    Comment: 1st intercourse- 16, partners- 2, married- 42 yrs, Hx of Breast Cancer  Other Topics Concern   Not on file  Social History Narrative   Married   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   Exercises 3 to 4 days a week   Social Drivers of Corporate investment banker Strain: Not on file  Food Insecurity: Not on file  Transportation Needs: Not on file  Physical Activity: Not on file  Stress: Not on file  Social Connections: Not on file  Intimate Partner Violence: Not on file    Family History  Problem Relation Age of Onset   Heart attack Mother 1   Diabetes Mellitus II Mother        Insulin-dependent   Coronary artery disease Mother 25   Hyperlipidemia Mother    Hypertension Mother    Diabetes Father    Coronary artery disease Father 67   Heart attack Father 33   Hyperlipidemia Father    Hypertension Father    Lung cancer Sister    Diabetes Mellitus II Sister    Colon cancer Sister 3   Diabetes Mellitus II Sister    Heart attack Sister    Coronary artery disease Sister    Diabetic kidney disease Sister        ESRD   Colon polyps Sister    Skin cancer Sister    Colon polyps Brother    Esophageal cancer Neg Hx    Stomach cancer Neg Hx    Rectal cancer Neg Hx      Allergies  Allergen Reactions   Adhesive [Tape] Other (See Comments)    BLISTERS   Hydrocodone Nausea And Vomiting   Diltiazem  Other (See Comments)    constipation      Patient's last menstrual period was No LMP recorded. Patient has had a hysterectomy..            Review of Systems Alls systems reviewed and are negative.     Physical Exam Constitutional:      Appearance: Normal appearance.   Genitourinary:     Vulva normal.     No lesions in the vagina.     Right Labia: No rash, lesions or skin changes.    Left Labia: No lesions, skin changes or rash.    Vaginal cuff intact.    No vaginal discharge or tenderness.     No vaginal prolapse present.    Mild vaginal atrophy present.     Right Adnexa: absent.    Right Adnexa: no mass present.    Left Adnexa: absent.    Left Adnexa: no mass present.    Cervix is absent.     Uterus is absent.  Breasts:    Right: Normal.     Left: Normal.  HENT:     Head: Normocephalic.  Neck:     Thyroid : No thyroid  mass, thyromegaly or thyroid  tenderness.   Cardiovascular:     Rate and Rhythm: Normal rate and regular rhythm.     Heart sounds: Normal heart sounds, S1 normal and S2 normal.  Pulmonary:     Effort: Pulmonary effort is normal.     Breath sounds: Normal breath sounds and air entry.  Abdominal:     General: Bowel sounds are normal. There is no distension.     Palpations: Abdomen is soft. There is no mass.     Tenderness: There is no abdominal tenderness. There is no guarding or rebound.   Musculoskeletal:  Cervical back: Full passive range of motion without pain, normal range of motion and neck supple. No tenderness.     Right lower leg: No edema.     Left lower leg: No edema.   Neurological:     Mental Status: She is alert.   Skin:    General: Skin is warm.   Psychiatric:        Mood and Affect: Mood normal.        Behavior: Behavior normal.        Thought Content: Thought content normal.  Vitals and nursing note reviewed. Exam conducted with a chaperone present.       A:         Well Woman GYN exam                             P:        Pap smear not indicated Encouraged annual mammogram screening Colon cancer screening up-to-date DXA ordered today To do at [facility] and immunizations to do with PMD Discussed breast self exams Encouraged healthy lifestyle practices Encouraged Vit D and Calcium    No follow-ups on file.  Almarie MARLA Carpen

## 2023-10-28 ENCOUNTER — Ambulatory Visit: Attending: Cardiology | Admitting: Cardiology

## 2023-10-28 ENCOUNTER — Encounter: Payer: Self-pay | Admitting: Cardiology

## 2023-10-28 VITALS — BP 136/76 | HR 90 | Ht 61.0 in | Wt 189.6 lb

## 2023-10-28 DIAGNOSIS — R0609 Other forms of dyspnea: Secondary | ICD-10-CM | POA: Diagnosis not present

## 2023-10-28 DIAGNOSIS — I2510 Atherosclerotic heart disease of native coronary artery without angina pectoris: Secondary | ICD-10-CM

## 2023-10-28 DIAGNOSIS — I10 Essential (primary) hypertension: Secondary | ICD-10-CM

## 2023-10-28 DIAGNOSIS — E1169 Type 2 diabetes mellitus with other specified complication: Secondary | ICD-10-CM | POA: Diagnosis not present

## 2023-10-28 DIAGNOSIS — E785 Hyperlipidemia, unspecified: Secondary | ICD-10-CM

## 2023-10-28 NOTE — Patient Instructions (Signed)

## 2023-10-28 NOTE — Progress Notes (Signed)
 Cardiology Office Note:  .   Date:  11/01/2023  ID:  [place] [name], DOB January 20, 1956, MRN [id number] PCP/Referring Provider: Stephanie Charlene CROME, MD  [facility] Providers Cardiologist:  Alm Clay, MD     Chief Complaint  Patient presents with   Follow-up    Doing well.  A little sad because her husband passed away in the fall.   Coronary Artery Disease    No angina.    Patient Profile: .     [name] is a moderately obese 68 y.o. female  with a PMH noted below who presents here for delayed annual f/u.  PMH Metabolic Syndrome (DM-2, HTN, HLD),  FamHx CAD & Elevated Cor Ca2+ Score (625): FFRct < diffuse MV CAD --> apical LAD +, & terminal LCx+/ =No obvious Flow-limiting lesions   Her usband [name]) recently died.   [name] was last seen on 08/22/2022.  She was doing fairly well.  Less active because her husband was feeling sicker.  Not walking as much but still walking about a mile most days a week.  She did not want to leave her husband alone because he was started to get sicker and she did not would leave him alone but she also did not want to walk alone.  Energy levels okay but had become more sedentary and deconditioned.  Noted dyspnea bending over and going up stairs quickly.  A little bit of poor balance and therefore would walk with a cane. => No changes made  Subjective  Discussed the use of AI scribe software for clinical note transcription with the patient, who gave verbal consent to proceed.  History of Present Illness History of Present Illness [name] is a 68 year old female with hypertension and coronary artery disease who presents for a six-month follow-up.  No current issues with chest tightness, pressure, or pain. No palpitations such as heart racing, skipping, or flip-flopping sensations. Continues metoprolol  at night for previously noted fast heart rate, losartan  half tablet in the morning, and rosuvastatin 10 mg. Started Ozempic  five  weeks ago for diabetes management, transitioning from metformin , with good blood sugar control.  Experienced weight loss from 205 pounds in November 2023 to 189 pounds currently, with some variability in scale readings.  She was recently started on Ozempic  and is hoping that weight loss will improve as well.   Cholesterol levels in December showed total cholesterol of 119, HDL of 48, LDL of 54, and triglycerides of 87. A1c was 6.0 in March. Kidney function tests in December showed creatinine at 0.84 and potassium at 4.5.  Had a coronary CT angiogram in 2022 revealing a high calcium score with plaque in her arteries. No chest pain, pressure, or tightness during activities. No shortness of breath, swelling in the legs, or claudication.  Decreased physical activity since her husband's passing but recently walked a mile with her puppy without issues. No stroke-like symptoms, sudden changes in vision, or slurred speech.  Consumes a glass or glass and a half of red wine at night to help relax and sleep. Not a smoker.  Cardiovascular ROS: no chest pain or dyspnea on exertion positive for - edema and this is trivial.  negative for - irregular heartbeat, orthopnea, palpitations, paroxysmal nocturnal dyspnea, rapid heart rate, shortness of breath, or syncope/near syncope, TIA/CVA/amaurosis fugax, claudication  ROS:  Review of Systems - Negative except - just started back walking - was depressed after husband's death.  Occasional leg cramping  Objective   Medications - Metoprolol  succinate 50 mg at night - Losartan  50 mg (1/2 of 100 mg tablet) in the morning - Ozempic  0.5 mg once a week (recently started) - Rosuvastatin 10 mg - Baby aspirin 81 mg daily  -Amitriptyline 3 mg nightly, gabapentin  300 mg daily, famotidine  40 mg daily.   Studies Reviewed: SABRA   EKG Interpretation Date/Time:  Wednesday October 28 2023 10:08:28 EDT Ventricular Rate:  90 PR Interval:  184 QRS Duration:  86 QT  Interval:  362 QTC Calculation: 442 R Axis:   12  Text Interpretation: Normal sinus rhythm Minimal voltage criteria for LVH, may be normal variant ( R in aVL ) When compared with ECG of 17-Sep-2012 09:31, No significant change was found Confirmed by Anner Lenis ([id number]) on 10/28/2023 10:50:16 AM    Results LABS Total cholesterol: 119 (03/2023) HDL: 48 (03/2023) LDL: 54 (03/2023) Triglycerides: 87 (03/2023) A1c: 6.0 (06/2023) Creatinine: 0.84 (03/2023) Potassium: 4.5 (03/2023)  RADIOLOGY Coronary CT Angiogram: High carotid calcium score with plaque in all three arteries of about 25-49% calcified and noncalcified plaque. Circumflex artery tapered and abnormal. LAD tapered down to the tip of the heart. (2022) Echocardiogram: Normal LV size and function.  EF 60 to 65%. T no WMA. ~Normal diastolic pressures.  Mild LA dilation.  Normal RV size and function.  Normal RVP and RAP.  Mild aortic valve calcification/sclerosis but no stenosis.  Normal mitral valve. (03/2022)  Risk Assessment/Calculations:            Physical Exam:   VS:  BP 136/76   Pulse 90   Ht 5' 1 (1.549 m)   Wt 189 lb 9.6 oz (86 kg)   SpO2 100%   BMI 35.82 kg/m    Wt Readings from Last 3 Encounters:  10/28/23 189 lb 9.6 oz (86 kg)  10/27/23 184 lb (83.5 kg)  07/21/23 188 lb (85.3 kg)    GEN: Well nourished, well groomed in no acute distress; healthy appearing/ Mildly obese NECK: No JVD; No carotid bruits CARDIAC: Distant S1, S2; RRR, 1 over 2/6 SEM RUSB.; no rubs, gallops RESPIRATORY:  Clear to auscultation without rales, wheezing or rhonchi ; nonlabored, good air movement. ABDOMEN: Soft, non-tender, non-distended EXTREMITIES:  No edema; No deformity ; abnormal gait, walks with a limp & a walking step    ASSESSMENT AND PLAN: .    Overall doing well.  Continues to lose weight.  Now getting more active.  Blood pressure, lipids and A1c are all well-controlled. No active angina.  Continue to monitor.   On  aspirin, statin, beta-blocker and ARB as well as Ozempic .    Problem List Items Addressed This Visit       Cardiology Problems   Coronary artery disease involving native coronary artery without angina pectoris - Primary   Previous coronary CT angiogram showed high carotid calcium score with plaque in all three arteries. Goal is plaque stabilization to prevent myocardial infarction. Coronary atherosclerosis is well-managed.  - Continue 81 mg aspirin daily. - Continue lipid management with rosuvastatin 10 mg daily.  Most recent LDL was 54. - Continue blood pressure control with 50 mg losartan  and 50 mg Toprol . - Diabetes not being managed with Ozempic  which help with weight loss, lipids and BP. - Encourage regular exercise and weight management.      Relevant Orders   EKG 12-Lead (Completed)   Essential hypertension (Chronic)   Hypertension is well-controlled with current medication regimen. Blood pressure readings are stable. - Continue  losartan  50 mg in the morning.  (Taking 1/2 of 100 mg tab) - Continue metoprolol  succinate 50 mg in the evening.      Hyperlipidemia associated with type 2 diabetes mellitus (HCC) (Chronic)   Most recent liver panel from December was outstanding with HDL of 48 and LDL of 54.  Total cholesterol down to 119.  He triglycerides are well-controlled. Last A1c was 6.0 in March.  She was recently started on Ozempic  which will hopefully help with continued weight loss and CV benefit.  - Continue rosuvastatin 10 mg daily along with Ozempic  titrated by PCP.        Other   DOE (dyspnea on exertion) (Chronic)   Starting to pick back up your level of activity now.  Somewhat deconditioned and therefore has been more short of breath but now she is starting to walk more routinely with her puppy having gotten over the grief of her husband's passing.             Follow-Up: Return in about 1 year (around 10/27/2024) for Routine follow up with me, [facility].   SABRA Fus, Alm MICAEL Clay, MD, MS Alm Clay, M.D., M.S. Interventional Chartered certified accountant  Pager # [phone]

## 2023-10-29 DIAGNOSIS — L820 Inflamed seborrheic keratosis: Secondary | ICD-10-CM | POA: Diagnosis not present

## 2023-10-29 DIAGNOSIS — L304 Erythema intertrigo: Secondary | ICD-10-CM | POA: Diagnosis not present

## 2023-11-01 ENCOUNTER — Encounter: Payer: Self-pay | Admitting: Cardiology

## 2023-11-01 NOTE — Assessment & Plan Note (Deleted)
 Overall doing well.  Continues to lose weight.  Now getting more active.  Blood pressure, lipids and A1c are all well-controlled. No active angina.  Continue to monitor.   On aspirin, statin, beta-blocker and ARB as well as Ozempic .

## 2023-11-01 NOTE — Assessment & Plan Note (Signed)
 Starting to pick back up your level of activity now.  Somewhat deconditioned and therefore has been more short of breath but now she is starting to walk more routinely with her puppy having gotten over the grief of her husband's passing.

## 2023-11-01 NOTE — Assessment & Plan Note (Signed)
 Hypertension is well-controlled with current medication regimen. Blood pressure readings are stable. - Continue losartan  50 mg in the morning.  (Taking 1/2 of 100 mg tab) - Continue metoprolol  succinate 50 mg in the evening.

## 2023-11-01 NOTE — Assessment & Plan Note (Signed)
 Overall doing well.  Continues to lose weight.  Now getting more active.  Blood pressure, lipids and A1c are all well-controlled. No active angina.  Continue to monitor.   On aspirin, statin, beta-blocker and ARB as well as Ozempic .

## 2023-11-01 NOTE — Assessment & Plan Note (Signed)
 Most recent liver panel from December was outstanding with HDL of 48 and LDL of 54.  Total cholesterol down to 119.  He triglycerides are well-controlled. Last A1c was 6.0 in March.  She was recently started on Ozempic  which will hopefully help with continued weight loss and CV benefit.  - Continue rosuvastatin 10 mg daily along with Ozempic  titrated by PCP.

## 2023-11-01 NOTE — Assessment & Plan Note (Signed)
 Previous coronary CT angiogram showed high carotid calcium score with plaque in all three arteries. Goal is plaque stabilization to prevent myocardial infarction. Coronary atherosclerosis is well-managed.  - Continue 81 mg aspirin daily. - Continue lipid management with rosuvastatin 10 mg daily.  Most recent LDL was 54. - Continue blood pressure control with 50 mg losartan  and 50 mg Toprol . - Diabetes not being managed with Ozempic  which help with weight loss, lipids and BP. - Encourage regular exercise and weight management.

## 2023-11-06 DIAGNOSIS — Z23 Encounter for immunization: Secondary | ICD-10-CM | POA: Diagnosis not present

## 2023-11-06 DIAGNOSIS — N182 Chronic kidney disease, stage 2 (mild): Secondary | ICD-10-CM | POA: Diagnosis not present

## 2023-11-06 DIAGNOSIS — I2510 Atherosclerotic heart disease of native coronary artery without angina pectoris: Secondary | ICD-10-CM | POA: Diagnosis not present

## 2023-11-06 DIAGNOSIS — I10 Essential (primary) hypertension: Secondary | ICD-10-CM | POA: Diagnosis not present

## 2023-11-06 DIAGNOSIS — N1831 Chronic kidney disease, stage 3a: Secondary | ICD-10-CM | POA: Diagnosis not present

## 2023-11-06 DIAGNOSIS — J439 Emphysema, unspecified: Secondary | ICD-10-CM | POA: Diagnosis not present

## 2023-11-06 DIAGNOSIS — E1140 Type 2 diabetes mellitus with diabetic neuropathy, unspecified: Secondary | ICD-10-CM | POA: Diagnosis not present

## 2023-11-19 ENCOUNTER — Telehealth: Payer: Self-pay

## 2023-11-19 NOTE — Telephone Encounter (Signed)
 Pharmacy Patient Advocate Encounter   Received notification from Pt Calls Messages that prior authorization for Ozempic  is required/requested.   Ozempic Brena is approved exclusively as an adjunct to diet and exercise to improve glycemic  control in adults with type 2 diabetes mellitus. A review of patient's medical chart reveals no A1C indicative of diabetes (at or above 6.5). Therefore, they do not  currently meet the criteria for prior authorization of this medication.

## 2023-11-19 NOTE — Telephone Encounter (Signed)
 Ozempic  needs PA per fax received from [facility].

## 2023-12-03 DIAGNOSIS — M85851 Other specified disorders of bone density and structure, right thigh: Secondary | ICD-10-CM | POA: Diagnosis not present

## 2023-12-03 DIAGNOSIS — Z1231 Encounter for screening mammogram for malignant neoplasm of breast: Secondary | ICD-10-CM | POA: Diagnosis not present

## 2023-12-03 DIAGNOSIS — M85852 Other specified disorders of bone density and structure, left thigh: Secondary | ICD-10-CM | POA: Diagnosis not present

## 2023-12-08 ENCOUNTER — Telehealth: Payer: Self-pay

## 2023-12-08 NOTE — Telephone Encounter (Signed)
 Per fax from [employer]  covered alternatives are  Tresiba [id number] and Toujeo.

## 2023-12-22 ENCOUNTER — Other Ambulatory Visit: Payer: Self-pay

## 2023-12-22 MED ORDER — OZEMPIC (0.25 OR 0.5 MG/DOSE) 2 MG/3ML ~~LOC~~ SOPN: PEN_INJECTOR | SUBCUTANEOUS | 1 refills | Status: DC

## 2024-01-07 DIAGNOSIS — Z7984 Long term (current) use of oral hypoglycemic drugs: Secondary | ICD-10-CM | POA: Diagnosis not present

## 2024-01-07 DIAGNOSIS — E119 Type 2 diabetes mellitus without complications: Secondary | ICD-10-CM | POA: Diagnosis not present

## 2024-01-07 DIAGNOSIS — H26493 Other secondary cataract, bilateral: Secondary | ICD-10-CM | POA: Diagnosis not present

## 2024-01-11 NOTE — Telephone Encounter (Signed)
 Done

## 2024-01-12 ENCOUNTER — Other Ambulatory Visit: Payer: Self-pay

## 2024-01-12 MED ORDER — TRUE METRIX BLOOD GLUCOSE TEST VI STRP: ORAL_STRIP | 12 refills | Status: DC

## 2024-01-21 ENCOUNTER — Ambulatory Visit: Admitting: Internal Medicine

## 2024-01-21 ENCOUNTER — Encounter: Payer: Self-pay | Admitting: Internal Medicine

## 2024-01-21 VITALS — BP 110/76 | HR 93 | Ht 61.0 in | Wt 186.0 lb

## 2024-01-21 DIAGNOSIS — E1142 Type 2 diabetes mellitus with diabetic polyneuropathy: Secondary | ICD-10-CM | POA: Diagnosis not present

## 2024-01-21 DIAGNOSIS — E1159 Type 2 diabetes mellitus with other circulatory complications: Secondary | ICD-10-CM | POA: Diagnosis not present

## 2024-01-21 DIAGNOSIS — K5900 Constipation, unspecified: Secondary | ICD-10-CM

## 2024-01-21 LAB — POCT GLYCOSYLATED HEMOGLOBIN (HGB A1C): Hemoglobin A1C: 5.8 % — AB (ref 4.0–5.6)

## 2024-01-21 MED ORDER — OZEMPIC (0.25 OR 0.5 MG/DOSE) 2 MG/3ML ~~LOC~~ SOPN: 0.5000 mg | PEN_INJECTOR | SUBCUTANEOUS | 3 refills | Status: DC

## 2024-01-21 NOTE — Patient Instructions (Signed)
 Increase  Ozempic  0.5 mg weekly    Take Miralax as needed for constipation

## 2024-01-21 NOTE — Progress Notes (Signed)
 Name: [name]  Age/ Sex: 68 y.o., female   MRN/ DOB: [id number], 1955-08-26     PCP: Stephanie Charlene CROME, MD   Reason for Endocrinology Evaluation: Type 2 Diabetes Mellitus  Initial Endocrine Consultative Visit: 08/18/2014    PATIENT IDENTIFIER: [name] is a 68 y.o. female with a past medical history of DM, dyslipidemia, HTN, and CAD. The patient has followed with Endocrinology clinic since 2016 for consultative assistance with management of her diabetes.  DIABETIC HISTORY:  [name] was diagnosed with DM 2006, patient has been on metformin  since diagnosis, no prior use of insulin. Her hemoglobin A1c has ranged from 5.9% in 2023, peaking at 6.3% in 2016.  Complications: PPN, PAD, and CAD Therapy: metformin  Other: she has never been on insulin; metformin  dosage is limited by lightheadedness.     The patient was followed by Dr. Kassie from 2016 until 08/2021  We switch metformin  to Ozempic  02/2023 with an A1c of 6.1%, due to cardiology recommendation  SUBJECTIVE:   During the last visit (07/21/2023): A1c 6.0%  Today (01/21/2024): [name] is here for a follow up on diabetes management.   She checks her blood sugars 1 times daily. The patient has not had hypoglycemic episodes.  Patient follows with cardiology for CAD, hyperlipidemia, and HTN   Weight has been overall stable No nausea or vomiting  Has occasional constipation  Had a viral infection last week which resulted right cervical lymphadenopathy, now she has a  right chin bruises. No pain   HOME DIABETES REGIMEN:  Ozempic  0.25 mg weekly    Statin: Yes ACE-I/ARB: Yes   METER DOWNLOAD SUMMARY:   88- 122 mg/dL     DIABETIC COMPLICATIONS: Microvascular complications:  neuropathy Denies: CKD, retinopathy Last Eye Exam: Completed 03/24/2023  Macrovascular complications:  CAD Denies:  CVA, PVD   HISTORY:  Past Medical History:  Past Medical History:  Diagnosis Date   Agatston coronary  artery calcium score greater than 400 07/11/2020   CAC Score 625.  LAD 204, LCx 227, RCA 195 (96th %-ile): Diffuse 25 to 49% lesions in LAD, D1, LCx, OM1 and mid RCA.  CT FFR negative.   Anesthesia complication    Hard to wake up past knee surgery in ? 2004!   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  A1c 6.2 (February 2022)   GERD (gastroesophageal reflux disease)    History of breast cancer 2011   s/p L lobectomy in 2012 as well as radiation with letrozole  x5 years.  (Dr. Layla)   Hyperlipidemia    Hypertension    under control with med., has been on med. x 6-7 yr.   Lesion of right ulnar nerve 08/2012   S/P radiation therapy /last radiation treatment 07/2010 07/04/10 - 08/11/10   5040 cGy/28 Treatments to Left Breast   Wears dentures    upper   Wears partial dentures    lower   Past Surgical History:  Past Surgical History:  Procedure Laterality Date   CARPAL TUNNEL RELEASE Right 09/21/2012   Procedure: CARPAL TUNNEL RELEASE;  Surgeon: Lamar LULLA Leonor Mickey., MD;  Location: [facility];  Service: Orthopedics;  Laterality: Right;   CARPAL TUNNEL RELEASE Left 11/30/2012   Procedure: CARPAL TUNNEL RELEASE;  Surgeon: Lamar LULLA Sypher  Mickey., MD;  Location: [facility];  Service: Orthopedics;  Laterality: Left;   CATARACT EXTRACTION Bilateral 2017   Bil/ right eye on 08/27/2015 and left eye on 09/17/2015 Dr Milan   CHOLECYSTECTOMY  09/16/2005   COLONOSCOPY  2017   Coronary CT angiogram  01/13/2021   Coronary Ca++ Score 625 - 3 V CAD. CAD RADS 2. Nll LM.  LAD, D1, LCx &OM1 (@ OM takeoff) 25-49% prox & mid, & w/ 1-25% proxl RCA and 25- 49% mid RCA. CT FFR: RCA 0.98, LM 0.98.  LAD 0.89 proximal, 0.5 mid and 0.76 apical (possibly distal tapering and not 1 focal lesion).  D10.86.  LCx 0.91 proximal, 0.90 [place]  supranasal distal with small caliber terminal portion 0.77.-No significant lesion   KNEE ARTHROSCOPY Left 08/11/2002   PARTIAL MASTECTOMY WITH NEEDLE LOCALIZATION AND AXILLARY SENTINEL LYMPH NODE BX Left 05/21/2010   with re-exc. medial margin lumpectomy site   ROBOTIC ASSISTED LAPAROSCOPIC LYSIS OF ADHESION  09/17/2010   ROBOTIC ASSISTED TOTAL HYSTERECTOMY WITH BILATERAL SALPINGO OOPHERECTOMY  09/17/2010   SHOULDER OPEN ROTATOR CUFF REPAIR Right 03/29/2013   Procedure: ROTATOR CUFF REPAIR SHOULDER OPEN WITH SUBACROMIAL DECOMPRESSION AND DISTAL CLAVICLE RESECTION;  Surgeon: Lamar LULLA Leonor Mickey., MD;  Location: [facility];  Service: Orthopedics;  Laterality: Right;   ULNAR NERVE TRANSPOSITION Right 09/21/2012   Procedure: RIGHT IN SITU DECOMPRESSION ULNAR NERVE;  Surgeon: Lamar LULLA Leonor Mickey., MD;  Location: [facility];  Service: Orthopedics;  Laterality: Right;   Social History:  reports that she quit smoking about 13 years ago. Her smoking use included cigarettes. She has never used smokeless tobacco. She reports that she does not drink alcohol  and does not use drugs. Family History:  Family History  Problem Relation Age of Onset   Heart attack Mother 48   Diabetes Mellitus II Mother        Insulin-dependent   Coronary artery disease Mother 42   Hyperlipidemia Mother    Hypertension Mother    Diabetes Father    Coronary artery disease Father 27   Heart attack Father 38   Hyperlipidemia Father    Hypertension Father    Lung cancer Sister    Diabetes Mellitus II Sister    Colon cancer Sister 36   Diabetes Mellitus II Sister    Heart attack Sister    Coronary artery disease Sister    Diabetic kidney disease Sister        ESRD   Colon polyps Sister    Skin cancer Sister    Colon polyps Brother    Esophageal cancer Neg Hx    Stomach cancer Neg Hx    Rectal cancer Neg Hx      HOME MEDICATIONS: Allergies as of 01/21/2024       Reactions   Adhesive  [tape] Other (See Comments)   BLISTERS   Hydrocodone Nausea And Vomiting   Diltiazem  Other (See Comments)   constipation        Medication List        Accurate as of January 21, 2024  8:03 AM. If you have any questions, ask your nurse or doctor.          amitriptyline 100 MG tablet Commonly known as: ELAVIL Take 300 mg by mouth at bedtime.   aspirin EC 81 MG tablet Take 81 mg by mouth daily. Swallow whole.   Comirnaty syringe Generic drug: COVID-19 mRNA vaccine (Pfizer)   DropSafe Alcohol  Prep 70 % Pads  USE AS DIRECTED   famotidine  40 MG tablet Commonly known as: PEPCID    gabapentin  300 MG capsule Commonly known as: NEURONTIN  Take by mouth.   losartan  100 MG tablet Commonly known as: COZAAR  Take 50 mg by mouth daily.   metoprolol  succinate 50 MG 24 hr tablet Commonly known as: TOPROL -XL Take 1 tablet (50 mg total) by mouth every evening. Take with or immediately following a meal.   ONE TOUCH ULTRA TEST test strip Generic drug: glucose blood Daily.   True Metrix Blood Glucose Test test strip Generic drug: glucose blood Use to check blood sugars   Ozempic  (0.25 or 0.5 MG/DOSE) 2 MG/3ML Sopn Generic drug: Semaglutide (0.25 or 0.5MG /DOS) INJECT 0.5 MG SUBCUTANEOUSLY ONCE A WEEK *NEW PRESCRIPTION REQUEST*   rosuvastatin 10 MG tablet Commonly known as: CRESTOR Take 10 mg by mouth 1 day or 1 dose.   TRUEplus Lancets 28G Misc Apply topically daily.         OBJECTIVE:   Vital Signs: BP 110/76 (BP Location: Left Arm, Patient Position: Sitting, Cuff Size: Normal)   Pulse 93   Ht 5' 1 (1.549 m)   Wt 186 lb (84.4 kg)   SpO2 98%   BMI 35.14 kg/m   Wt Readings from Last 3 Encounters:  01/21/24 186 lb (84.4 kg)  10/28/23 189 lb 9.6 oz (86 kg)  10/27/23 184 lb (83.5 kg)   Filed Weights   01/21/24 0748  Weight: 186 lb (84.4 kg)    Exam: General: Pt appears well and is in NAD Patient has been noted with bruising in the right postauricular  area with yellowish skin discoloration extending into the right lateral neck  Lungs: Clear with good BS bilat   Heart: RRR   Abdomen: Right abdominal wall bruising, soft, nontender  Extremities: No  pretibial edema.   Neuro: MS is good with appropriate affect, pt is alert and Ox3    DM foot exam: 01/21/2024  The skin of the feet is intact without sores or ulcerations. The pedal pulses are 2+ on right and 2+ on left. The sensation is intact to a screening 5.07, 10 gram monofilament bilaterally     DATA REVIEWED:  Lab Results  Component Value Date   HGBA1C 5.8 (A) 01/21/2024   HGBA1C 6.0 (A) 07/21/2023   HGBA1C 6.1 (A) 03/24/2023    Latest Reference Range & Units 03/24/23 08:30  Microalb, Ur mg/dL 0.6  MICROALB/CREAT RATIO <30 mg/g creat 20  Creatinine, Urine 20 - 275 mg/dL 30     ASSESSMENT / PLAN / RECOMMENDATIONS:   1) Type 2 Diabetes Mellitus, Optimally controlled, With neuropathic and macrovascular complications - Most recent A1c of 5.8 %. Goal A1c < 7.0 %.    - A1c at goal -We opted to increase Ozempic  to help with more weight loss -Ozempic  was switched from metformin , she had no adverse events to metformin  but this was due to cardiovascular benefits  MEDICATIONS:  Continue Ozempic  0.25 mg weekly  EDUCATION / INSTRUCTIONS: BG monitoring instructions: Patient is instructed to check her blood sugars 2-3 times a week. Call [facility] if: BG persistently < 70  I reviewed the Rule of 15 for the treatment of hypoglycemia in detail with the patient. Literature supplied.    2) Diabetic complications:  Eye: Does not have known diabetic retinopathy.  Neuro/ Feet: Does  have known diabetic peripheral neuropathy .  Renal: Patient does not have known baseline CKD. She   is  on an ACEI/ARB at present.  3) Constipation :   - Encouraged hydration, increase fiber intake - Patient may use MiraLAX as needed     F/U in 6 months     Signed  electronically by: Stefano Redgie Butts, MD  [facility] Endocrinology  [facility] [address] [place], [place] [address] Phone: [phone] FAX: [phone]   CC: Stephanie Charlene CROME, MD [address] [place] [address] Phone: [phone]  Fax: [phone]  Return to Endocrinology clinic as below: Future Appointments  Date Time Provider Department Center  01/21/2024  8:10 AM Emmely Bittinger, Donell Redgie, MD [facility] None

## 2024-02-22 ENCOUNTER — Telehealth: Payer: Self-pay | Admitting: Dietician

## 2024-02-22 NOTE — Telephone Encounter (Signed)
 Patient left a message on my voicemail stating that she was instructed to increase her Ozempic  to 0.75 and needs a prescription for this.  Chart reviewed.  Patient instructions to continue 0.5 dose. Called patient who states that she was told to increase to 0.75 dose.  Will clarify with MD.  Leita Constable, RD, LDN, CDCES, DipACLM

## 2024-02-23 NOTE — Telephone Encounter (Signed)
 Patient states that she has been on the 0.5mg  dose since September visit. She would like to move to the 1mg  dose.

## 2024-02-23 NOTE — Telephone Encounter (Signed)
Attempted to contact patient no answer and vm is full

## 2024-03-08 ENCOUNTER — Telehealth: Payer: Self-pay

## 2024-03-08 NOTE — Telephone Encounter (Signed)
 Patient is having trouble getting her ozempic  filled.

## 2024-03-09 ENCOUNTER — Other Ambulatory Visit: Payer: Self-pay | Admitting: Internal Medicine

## 2024-03-09 ENCOUNTER — Other Ambulatory Visit (HOSPITAL_COMMUNITY): Payer: Self-pay

## 2024-03-09 ENCOUNTER — Telehealth: Payer: Self-pay

## 2024-03-09 MED ORDER — WEGOVY 0.5 MG/0.5ML ~~LOC~~ SOAJ: 0.5000 mg | SUBCUTANEOUS | 6 refills | Status: DC

## 2024-03-09 NOTE — Telephone Encounter (Signed)
 Pharmacy Patient Advocate Encounter   Received notification from RX Request Messages that prior authorization for Wegovy  is required/requested.   Insurance verification completed.   The patient is insured through [employer].   Per test claim: Effective October 1st, Medicaid discontinued coverage of GLP1 medications for weight loss (such as Wegovy  and Zepbound), unless the patient has a documented history of a heart attack or stroke. Zepbound will continue to be covered only for patients with moderate to severe sleep apnea (AHI 15-30) and a BMI greater than 40. Because of this change, the prior authorization team will not be submitting new PA requests for GLP1 medications prescribed for weight loss, as patients will be unable to continue therapy under Medicaid coverage.

## 2024-03-09 NOTE — Telephone Encounter (Signed)
Left message informing patient of medication change. °

## 2024-03-09 NOTE — Addendum Note (Signed)
 Addended by: SAM DONELL PARAS on: 03/09/2024 08:32 AM   Modules accepted: Orders

## 2024-03-10 ENCOUNTER — Other Ambulatory Visit: Payer: Self-pay | Admitting: Cardiology

## 2024-03-10 ENCOUNTER — Other Ambulatory Visit: Payer: Self-pay

## 2024-03-11 ENCOUNTER — Telehealth: Payer: Self-pay | Admitting: Cardiology

## 2024-03-11 ENCOUNTER — Other Ambulatory Visit: Payer: Self-pay

## 2024-03-11 MED ORDER — METOPROLOL SUCCINATE ER 50 MG PO TB24: 50.0000 mg | ORAL_TABLET | Freq: Every evening | ORAL | 2 refills | Status: DC

## 2024-03-11 MED ORDER — DROPSAFE ALCOHOL PREP 70 % PADS: MEDICATED_PAD | 3 refills | Status: AC

## 2024-03-11 NOTE — Telephone Encounter (Signed)
*  STAT* If patient is at the pharmacy, call can be transferred to refill team.   1. Which medications need to be refilled? (please list name of each medication and dose if known) metoprolol  succinate (TOPROL -XL) 50 MG 24 hr tablet   2. Which pharmacy/location (including street and city if local pharmacy) is medication to be sent to? [facility] - [address] [place] - [address]   3. Do they need a 30 day or 90 day supply? 90

## 2024-03-11 NOTE — Telephone Encounter (Signed)
 Prescriptions sent

## 2024-03-14 ENCOUNTER — Telehealth: Payer: Self-pay | Admitting: Nutrition

## 2024-03-14 NOTE — Telephone Encounter (Signed)
 Message left on my machine:   Randine calling from [facility] requesting a refill on 1 mg Ozempic .  Please call back: [phone]

## 2024-03-17 ENCOUNTER — Other Ambulatory Visit: Payer: Self-pay | Admitting: Internal Medicine

## 2024-03-17 ENCOUNTER — Telehealth: Payer: Self-pay

## 2024-03-17 MED ORDER — SEMAGLUTIDE (1 MG/DOSE) 4 MG/3ML ~~LOC~~ SOPN: 1.0000 mg | PEN_INJECTOR | SUBCUTANEOUS | 6 refills | Status: DC

## 2024-03-17 NOTE — Telephone Encounter (Signed)
 Patient states she gets her medication approved through her insurance. Please send in next dose to have patient begin Ozempic  1mg .

## 2024-03-17 NOTE — Telephone Encounter (Signed)
 Does Ozempic  have a savings program she can do to ensure proper coverage. I spoke with patient informing her about the insurance not covering the medication. Patient stated last time she only paid $80-$90 out of pocket. I did informed her that she will need to see if they cover again if not she can call our office to discuss if she qualifies for Patient assistance program or a savings program through the company.

## 2024-03-17 NOTE — Telephone Encounter (Signed)
 Patient left message requesting her Semaglutide  increase to 1mg  be sent [facility] to [facility] states she will be out on Sunday.

## 2024-03-17 NOTE — Telephone Encounter (Signed)
 Please see previous encounter from 11/19/23. Pt does not qualify for a PA for ozempic  and her insurance will not cover wegovy .

## 2024-03-18 ENCOUNTER — Telehealth: Payer: Self-pay

## 2024-03-18 NOTE — Telephone Encounter (Signed)
 Spoke with pharmacy and they say that ozempic  needs a prior auth.

## 2024-03-20 ENCOUNTER — Other Ambulatory Visit: Payer: Self-pay | Admitting: Cardiology

## 2024-03-28 ENCOUNTER — Telehealth: Payer: Self-pay

## 2024-03-28 ENCOUNTER — Other Ambulatory Visit: Payer: Self-pay

## 2024-03-28 MED ORDER — SEMAGLUTIDE (1 MG/DOSE) 4 MG/3ML ~~LOC~~ SOPN: 1.0000 mg | PEN_INJECTOR | SUBCUTANEOUS | 6 refills | Status: DC

## 2024-03-28 NOTE — Telephone Encounter (Signed)
 Patient states she spoke with [facility] and they needed a PA for her Ozempic  1mg .

## 2024-03-29 ENCOUNTER — Telehealth: Payer: Self-pay | Admitting: Pharmacist

## 2024-03-29 MED ORDER — SEMAGLUTIDE (1 MG/DOSE) 4 MG/3ML ~~LOC~~ SOPN: 1.0000 mg | PEN_INJECTOR | SUBCUTANEOUS | 6 refills | Status: DC

## 2024-03-29 NOTE — Telephone Encounter (Signed)
 Without laboratory documentation confirming type 2 diabetes--such as an A1c >=6.5% or at least two fasting glucose readings >=126 mg/dL--the insurance will not approve a PA for Ozempic .  Please advise.  Thank you, Devere Pandy, PharmD Clinical Pharmacist  [place]  Direct Dial: [phone]

## 2024-03-29 NOTE — Addendum Note (Signed)
 Addended by: Dmani Mizer M on: 03/29/2024 11:04 AM   Modules accepted: Orders

## 2024-03-30 NOTE — Telephone Encounter (Signed)
 Attempted to reach patient to give her an update but no answer and voicemail full.

## 2024-03-30 NOTE — Telephone Encounter (Signed)
 PA has been submitted. Key: [id number]

## 2024-03-30 NOTE — Telephone Encounter (Signed)
 Patient no longer qualifies for ozempic .  Please advise change.

## 2024-03-30 NOTE — Telephone Encounter (Signed)
 Per last office note  1) Type 2 Diabetes Mellitus, Optimally controlled, With neuropathic and macrovascular complications - Most recent A1c of 5.8 %. Goal A1c < 7.0 %.     - A1c at goal -We opted to increase Ozempic  to help with more weight loss -Ozempic  was switched from metformin , she had no adverse events to metformin  but this was due to cardiovascular benefits

## 2024-03-30 NOTE — Telephone Encounter (Signed)
 They normally require an A1C greater than 6.4 or two fasting glucose levels above 126.  I will have the advocate submit the PA and if it is rejected, I can attempt an appeal stating documentation from 2006 is not available.

## 2024-04-07 ENCOUNTER — Other Ambulatory Visit: Payer: Self-pay

## 2024-04-07 ENCOUNTER — Telehealth: Payer: Self-pay | Admitting: Dietician

## 2024-04-07 DIAGNOSIS — E1142 Type 2 diabetes mellitus with diabetic polyneuropathy: Secondary | ICD-10-CM

## 2024-04-07 NOTE — Telephone Encounter (Signed)
 Returned patient call.  She states that she only received 3 Ozempic  pens and will need more to get her through the rest of the year.  Patient states that she is dialing pen up to 1 mg as prescribed.  Instructed patient that there are multiple doses in one Ozempic  Pen and it should last her about 1 month.  Patient to call for further questions.  Leita Constable, RD, LDN, CDCES, DipACLM

## 2024-04-08 ENCOUNTER — Other Ambulatory Visit: Payer: Self-pay | Admitting: Cardiology

## 2024-04-11 MED ORDER — METOPROLOL SUCCINATE ER 50 MG PO TB24: 50.0000 mg | ORAL_TABLET | Freq: Every day | ORAL | 2 refills | Status: AC

## 2024-04-13 ENCOUNTER — Other Ambulatory Visit: Payer: Self-pay

## 2024-04-13 DIAGNOSIS — E1142 Type 2 diabetes mellitus with diabetic polyneuropathy: Secondary | ICD-10-CM

## 2024-04-13 MED ORDER — SEMAGLUTIDE (1 MG/DOSE) 4 MG/3ML ~~LOC~~ SOPN: 1.0000 mg | PEN_INJECTOR | SUBCUTANEOUS | 6 refills | Status: DC

## 2024-04-26 ENCOUNTER — Other Ambulatory Visit: Payer: Self-pay | Admitting: Internal Medicine

## 2024-04-26 DIAGNOSIS — E1142 Type 2 diabetes mellitus with diabetic polyneuropathy: Secondary | ICD-10-CM

## 2024-05-04 ENCOUNTER — Other Ambulatory Visit: Payer: Self-pay

## 2024-05-04 DIAGNOSIS — E1142 Type 2 diabetes mellitus with diabetic polyneuropathy: Secondary | ICD-10-CM

## 2024-05-04 MED ORDER — OZEMPIC (1 MG/DOSE) 4 MG/3ML ~~LOC~~ SOPN: PEN_INJECTOR | SUBCUTANEOUS | 10 refills | Status: AC

## 2024-05-04 NOTE — Telephone Encounter (Signed)
 Patient called to get her [facility]  [facility]  Updated script sent.  Patient aware.

## 2024-05-06 ENCOUNTER — Other Ambulatory Visit: Payer: Self-pay

## 2024-05-06 MED ORDER — TRUE METRIX BLOOD GLUCOSE TEST VI STRP: ORAL_STRIP | 12 refills | Status: AC

## 2024-07-13 ENCOUNTER — Ambulatory Visit: Admitting: Internal Medicine
